# Patient Record
Sex: Female | Born: 1935
Health system: Southern US, Community
[De-identification: ages and names within clinical notes are randomized; demographics above are authoritative.]

## PROBLEM LIST (undated history)

## (undated) DIAGNOSIS — J449 Chronic obstructive pulmonary disease, unspecified: Secondary | ICD-10-CM

## (undated) DIAGNOSIS — F419 Anxiety disorder, unspecified: Secondary | ICD-10-CM

## (undated) DIAGNOSIS — M419 Scoliosis, unspecified: Secondary | ICD-10-CM

## (undated) DIAGNOSIS — K219 Gastro-esophageal reflux disease without esophagitis: Secondary | ICD-10-CM

## (undated) DIAGNOSIS — D649 Anemia, unspecified: Secondary | ICD-10-CM

## (undated) DIAGNOSIS — K7460 Unspecified cirrhosis of liver: Secondary | ICD-10-CM

## (undated) DIAGNOSIS — I499 Cardiac arrhythmia, unspecified: Secondary | ICD-10-CM

## (undated) DIAGNOSIS — I639 Cerebral infarction, unspecified: Secondary | ICD-10-CM

## (undated) DIAGNOSIS — Z9289 Personal history of other medical treatment: Secondary | ICD-10-CM

## (undated) DIAGNOSIS — E7800 Pure hypercholesterolemia, unspecified: Secondary | ICD-10-CM

## (undated) DIAGNOSIS — M5030 Other cervical disc degeneration, unspecified cervical region: Secondary | ICD-10-CM

## (undated) DIAGNOSIS — G8929 Other chronic pain: Secondary | ICD-10-CM

## (undated) DIAGNOSIS — M549 Dorsalgia, unspecified: Secondary | ICD-10-CM

## (undated) DIAGNOSIS — I10 Essential (primary) hypertension: Secondary | ICD-10-CM

## (undated) DIAGNOSIS — Z950 Presence of cardiac pacemaker: Secondary | ICD-10-CM

## (undated) DIAGNOSIS — R0602 Shortness of breath: Secondary | ICD-10-CM

## (undated) HISTORY — PX: CATARACT EXTRACTION W/ INTRAOCULAR LENS  IMPLANT, BILATERAL: SHX1307

## (undated) HISTORY — PX: JOINT REPLACEMENT: SHX530

## (undated) HISTORY — PX: TONSILLECTOMY: SUR1361

## (undated) HISTORY — DX: Presence of cardiac pacemaker: Z95.0

---
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
## 2001-09-29 ENCOUNTER — Encounter: Payer: Self-pay | Admitting: Emergency Medicine

## 2001-09-30 ENCOUNTER — Encounter: Payer: Self-pay | Admitting: Emergency Medicine

## 2001-09-30 ENCOUNTER — Inpatient Hospital Stay (HOSPITAL_COMMUNITY): Admission: EM | Admit: 2001-09-30 | Discharge: 2001-10-08 | Payer: Self-pay | Admitting: Emergency Medicine

## 2001-10-02 ENCOUNTER — Encounter: Payer: Self-pay | Admitting: Internal Medicine

## 2005-01-09 ENCOUNTER — Ambulatory Visit: Payer: Self-pay | Admitting: Family Medicine

## 2005-01-09 ENCOUNTER — Inpatient Hospital Stay (HOSPITAL_COMMUNITY): Admission: EM | Admit: 2005-01-09 | Discharge: 2005-01-10 | Payer: Self-pay | Admitting: Emergency Medicine

## 2008-05-23 HISTORY — PX: REPLACEMENT TOTAL KNEE: SUR1224

## 2008-07-07 ENCOUNTER — Inpatient Hospital Stay (HOSPITAL_COMMUNITY): Admission: RE | Admit: 2008-07-07 | Discharge: 2008-07-11 | Payer: Self-pay | Admitting: Orthopedic Surgery

## 2008-08-11 ENCOUNTER — Encounter: Admission: RE | Admit: 2008-08-11 | Discharge: 2008-09-29 | Payer: Self-pay | Admitting: Orthopedic Surgery

## 2008-08-25 ENCOUNTER — Ambulatory Visit: Payer: Self-pay | Admitting: Family Medicine

## 2008-08-25 DIAGNOSIS — R110 Nausea: Secondary | ICD-10-CM

## 2008-08-25 DIAGNOSIS — K219 Gastro-esophageal reflux disease without esophagitis: Secondary | ICD-10-CM

## 2008-08-25 DIAGNOSIS — D509 Iron deficiency anemia, unspecified: Secondary | ICD-10-CM | POA: Insufficient documentation

## 2008-08-25 DIAGNOSIS — I10 Essential (primary) hypertension: Secondary | ICD-10-CM

## 2008-08-25 DIAGNOSIS — E119 Type 2 diabetes mellitus without complications: Secondary | ICD-10-CM | POA: Insufficient documentation

## 2008-08-25 LAB — CONVERTED CEMR LAB
Eosinophils Absolute: 0 10*3/uL (ref 0.0–0.7)
Eosinophils Relative: 0 % (ref 0–5)
HCT: 41.9 % (ref 36.0–46.0)
Hemoglobin: 14.6 g/dL (ref 12.0–15.0)
Lymphocytes Relative: 18 % (ref 12–46)
Lymphs Abs: 1.9 10*3/uL (ref 0.7–4.0)
MCV: 93.3 fL (ref 78.0–100.0)
Monocytes Absolute: 0.6 10*3/uL (ref 0.1–1.0)
Monocytes Relative: 6 % (ref 3–12)
Platelets: 407 10*3/uL — ABNORMAL HIGH (ref 150–400)
RBC: 4.49 M/uL (ref 3.87–5.11)
RDW: 12.3 % (ref 11.5–15.5)
WBC: 10.3 10*3/uL (ref 4.0–10.5)

## 2009-02-26 ENCOUNTER — Ambulatory Visit: Payer: Self-pay | Admitting: Family Medicine

## 2009-02-26 DIAGNOSIS — R071 Chest pain on breathing: Secondary | ICD-10-CM

## 2009-03-17 LAB — HM MAMMOGRAPHY

## 2009-10-26 ENCOUNTER — Encounter: Payer: Self-pay | Admitting: Gastroenterology

## 2009-12-11 ENCOUNTER — Encounter: Payer: Self-pay | Admitting: Gastroenterology

## 2009-12-21 ENCOUNTER — Encounter (INDEPENDENT_AMBULATORY_CARE_PROVIDER_SITE_OTHER): Payer: Self-pay | Admitting: *Deleted

## 2009-12-22 ENCOUNTER — Encounter: Payer: Self-pay | Admitting: Gastroenterology

## 2009-12-22 ENCOUNTER — Encounter
Admission: RE | Admit: 2009-12-22 | Discharge: 2009-12-22 | Payer: Self-pay | Source: Home / Self Care | Admitting: Family Medicine

## 2010-02-03 ENCOUNTER — Ambulatory Visit: Payer: Self-pay | Admitting: Gastroenterology

## 2010-02-04 ENCOUNTER — Encounter: Payer: Self-pay | Admitting: Gastroenterology

## 2010-02-04 LAB — CONVERTED CEMR LAB
Basophils Absolute: 0 10*3/uL (ref 0.0–0.1)
Bilirubin, Direct: 0.1 mg/dL (ref 0.0–0.3)
CO2: 33 meq/L — ABNORMAL HIGH (ref 19–32)
Calcium: 9.9 mg/dL (ref 8.4–10.5)
Eosinophils Relative: 2.1 % (ref 0.0–5.0)
GFR calc non Af Amer: 74.32 mL/min (ref >60)
Glucose, Bld: 98 mg/dL (ref 70–99)
HCT: 40.9 % (ref 36.0–46.0)
Hemoglobin: 14.3 g/dL (ref 12.0–15.0)
Monocytes Absolute: 0.9 10*3/uL (ref 0.1–1.0)
Neutro Abs: 6.1 10*3/uL (ref 1.4–7.7)
Potassium: 4.7 meq/L (ref 3.5–5.1)
RBC: 4.57 M/uL (ref 3.87–5.11)
RDW: 12 % (ref 11.5–14.6)
Total Bilirubin: 0.5 mg/dL (ref 0.3–1.2)
Total Protein: 7 g/dL (ref 6.0–8.3)

## 2010-03-19 ENCOUNTER — Ambulatory Visit: Payer: Self-pay | Admitting: Gastroenterology

## 2010-03-19 DIAGNOSIS — R740 Nonspecific elevation of levels of transaminase and lactic acid dehydrogenase [LDH]: Secondary | ICD-10-CM

## 2010-03-22 LAB — CONVERTED CEMR LAB
AST: 35 units/L (ref 0–37)
Bilirubin, Direct: 0.1 mg/dL (ref 0.0–0.3)
Total Bilirubin: 0.9 mg/dL (ref 0.3–1.2)

## 2010-04-06 LAB — CONVERTED CEMR LAB: ANA Titer 1: 1:320 {titer} — ABNORMAL HIGH

## 2010-04-13 ENCOUNTER — Encounter: Payer: Self-pay | Admitting: Gastroenterology

## 2010-04-21 ENCOUNTER — Ambulatory Visit: Payer: Self-pay | Admitting: Gastroenterology

## 2010-04-21 LAB — CONVERTED CEMR LAB
AST: 121 units/L — ABNORMAL HIGH (ref 0–37)
Albumin: 3.3 g/dL — ABNORMAL LOW (ref 3.5–5.2)
Alkaline Phosphatase: 141 units/L — ABNORMAL HIGH (ref 39–117)
BUN: 14 mg/dL (ref 6–23)
Chloride: 91 meq/L — ABNORMAL LOW (ref 96–112)
Creatinine, Ser: 0.8 mg/dL (ref 0.4–1.2)
Eosinophils Absolute: 0.1 10*3/uL (ref 0.0–0.7)
Eosinophils Relative: 1 % (ref 0.0–5.0)
GFR calc non Af Amer: 72.19 mL/min (ref >60)
HCT: 40.9 % (ref 36.0–46.0)
Hemoglobin: 13.9 g/dL (ref 12.0–15.0)
IgA: 407 mg/dL — ABNORMAL HIGH (ref 68–378)
Iron: 91 ug/dL (ref 42–145)
Monocytes Relative: 5.3 % (ref 3.0–12.0)
Neutrophils Relative %: 78.4 % — ABNORMAL HIGH (ref 43.0–77.0)
RBC: 4.5 M/uL (ref 3.87–5.11)
RDW: 13.3 % (ref 11.5–14.6)
Total Protein: 6.6 g/dL (ref 6.0–8.3)
Transferrin: 270 mg/dL (ref 212.0–360.0)
WBC: 13.3 10*3/uL — ABNORMAL HIGH (ref 4.5–10.5)

## 2010-04-28 LAB — CONVERTED CEMR LAB
Ceruloplasmin: 41 mg/dL (ref 21–63)
Hep A Total Ab: POSITIVE — AB

## 2010-04-30 ENCOUNTER — Encounter: Payer: Self-pay | Admitting: Gastroenterology

## 2010-04-30 ENCOUNTER — Ambulatory Visit (HOSPITAL_COMMUNITY)
Admission: RE | Admit: 2010-04-30 | Discharge: 2010-04-30 | Payer: Self-pay | Source: Home / Self Care | Attending: Gastroenterology | Admitting: Gastroenterology

## 2010-05-25 ENCOUNTER — Telehealth: Payer: Self-pay | Admitting: Gastroenterology

## 2010-05-28 ENCOUNTER — Telehealth: Payer: Self-pay | Admitting: Gastroenterology

## 2010-06-01 ENCOUNTER — Ambulatory Visit
Admission: RE | Admit: 2010-06-01 | Discharge: 2010-06-01 | Payer: Self-pay | Source: Home / Self Care | Attending: Gastroenterology | Admitting: Gastroenterology

## 2010-06-01 ENCOUNTER — Other Ambulatory Visit: Payer: Self-pay | Admitting: Gastroenterology

## 2010-06-01 DIAGNOSIS — K745 Biliary cirrhosis, unspecified: Secondary | ICD-10-CM | POA: Insufficient documentation

## 2010-06-01 LAB — CBC WITH DIFFERENTIAL/PLATELET
Basophils Absolute: 0 10*3/uL (ref 0.0–0.1)
Basophils Relative: 0.1 % (ref 0.0–3.0)
Eosinophils Absolute: 0.1 10*3/uL (ref 0.0–0.7)
Eosinophils Relative: 0.6 % (ref 0.0–5.0)
HCT: 40.2 % (ref 36.0–46.0)
Hemoglobin: 13.9 g/dL (ref 12.0–15.0)
Lymphocytes Relative: 16.1 % (ref 12.0–46.0)
Lymphs Abs: 2 10*3/uL (ref 0.7–4.0)
MCHC: 34.5 g/dL (ref 30.0–36.0)
MCV: 90.9 fl (ref 78.0–100.0)
Monocytes Absolute: 0.8 10*3/uL (ref 0.1–1.0)
Monocytes Relative: 6.7 % (ref 3.0–12.0)
Neutro Abs: 9.6 10*3/uL — ABNORMAL HIGH (ref 1.4–7.7)
Neutrophils Relative %: 76.5 % (ref 43.0–77.0)
Platelets: 340 10*3/uL (ref 150.0–400.0)
RBC: 4.42 Mil/uL (ref 3.87–5.11)
RDW: 13.1 % (ref 11.5–14.6)
WBC: 12.6 10*3/uL — ABNORMAL HIGH (ref 4.5–10.5)

## 2010-06-01 LAB — COMPREHENSIVE METABOLIC PANEL
ALT: 16 U/L (ref 0–35)
AST: 22 U/L (ref 0–37)
Albumin: 3.3 g/dL — ABNORMAL LOW (ref 3.5–5.2)
Alkaline Phosphatase: 165 U/L — ABNORMAL HIGH (ref 39–117)
BUN: 10 mg/dL (ref 6–23)
CO2: 31 mEq/L (ref 19–32)
Calcium: 9.3 mg/dL (ref 8.4–10.5)
Chloride: 91 mEq/L — ABNORMAL LOW (ref 96–112)
Creatinine, Ser: 0.7 mg/dL (ref 0.4–1.2)
GFR: 94.37 mL/min (ref >60.00)
Glucose, Bld: 90 mg/dL (ref 70–99)
Potassium: 3.5 mEq/L (ref 3.5–5.1)
Sodium: 130 mEq/L — ABNORMAL LOW (ref 135–145)
Total Bilirubin: 0.6 mg/dL (ref 0.3–1.2)
Total Protein: 6.8 g/dL (ref 6.0–8.3)

## 2010-06-01 LAB — VITAMIN B12: Vitamin B-12: 496 pg/mL (ref 211–911)

## 2010-06-24 NOTE — Letter (Signed)
Summary: New Patient letter  [facility]  [address] [place], [place] [address]   Phone: [phone]  Fax: [phone]       12/21/2007 MRN: [id number]  Diamond Miller [address] [place], [place]  [address]  Dear [name],  Welcome to the Gastroenterology Division at [facility].    You are scheduled to see Dr.  Christella Hartigan on 02-03-10 at 2:00p.m. on the 3rd floor at [facility], [address]  We ask that you try to arrive at our office 15 minutes prior to your appointment time to allow for check-in.  We would like you to complete the enclosed self-administered evaluation form prior to your visit and bring it with you on the day of your appointment.  We will review it with you.  Also, please bring a complete list of all your medications or, if you prefer, bring the medication bottles and we will list them.  Please bring your insurance card so that we may make a copy of it.  If your insurance requires a referral to see a specialist, please bring your referral form from your primary care physician.  Co-payments are due at the time of your visit and may be paid by cash, check or credit card.     Your office visit will consist of a consult with your physician (includes a physical exam), any laboratory testing he/she may order, scheduling of any necessary diagnostic testing (e.g. x-ray, ultrasound, CT-scan), and scheduling of a procedure (e.g. Endoscopy, Colonoscopy) if required.  Please allow enough time on your schedule to allow for any/all of these possibilities.    If you cannot keep your appointment, please call [phone] to cancel or reschedule prior to your appointment date.  This allows [place] the opportunity to schedule an appointment for another patient in need of care.  If you do not cancel or reschedule by 5 p.m. the business day prior to your appointment date, you will be charged a $50.00 late cancellation/no-show fee.    Thank you for  choosing [facility] for your medical needs.  We appreciate the opportunity to care for you.  Please visit [place] at our website  to learn more about our practice.                     Sincerely,                                                             The Gastroenterology Division

## 2010-06-24 NOTE — Letter (Signed)
Summary: [facility]  [facility]   Imported By: Lennie Odor 04/22/2010 17:08:33  _____________________________________________________________________  External Attachment:    Type:   Image     Comment:   External Document

## 2010-06-24 NOTE — Assessment & Plan Note (Signed)
Review of gastrointestinal problems: 1. Asymptomatic Elevated alk phos.  Labs September 2011 show otherwise liver tests completely normal, CBC normal, INR normal. Abdominal ultrasound, liver was normal. No for rhinitis. Alkaline phosphatase varies from 200-400 Dating back to 2010.  October, 2011: anti-smooth muscle antibody negative, ANA homogenous + 1:320 titer.   History of Present Illness Visit Type: Follow-up Visit Primary GI MD: Rob Bunting MD Primary Provider: Leo Grosser, MD  Requesting Provider: na Chief Complaint: Discuss lab History of Present Illness:     Diamond 77 year old Miller who is here today with her long-term friend. She has no pruritus. She has no specific liver symptoms of jaundice, she is never been told that she had hepatitis before. I tried to explain to her that her AAA was very elevated, suggesting some type of autoimmune process. The bulk of her liver tests are all normal.   GI Review of Systems      Denies abdominal pain, acid reflux, belching, bloating, chest pain, dysphagia with liquids, dysphagia with solids, heartburn, loss of appetite, nausea, vomiting, vomiting blood, weight loss, and  weight gain.        Denies anal fissure, black tarry stools, change in bowel habit, constipation, diarrhea, diverticulosis, fecal incontinence, heme positive stool, hemorrhoids, irritable bowel syndrome, jaundice, light color stool, liver problems, rectal bleeding, and  rectal pain.    Current Medications (verified): 1)  Metoprolol Succinate 50 Mg Xr24h-Tab (Metoprolol Succinate) .... One By Mouth Once Daily 2)  Nexium 40 Mg Cpdr (Esomeprazole Magnesium) .... One Tablet By Mouth Once Daily 3)  Centrum  Tabs (Multiple Vitamins-Minerals) .... One Tablet By Mouth Once Daily 4)  Oxycodone Hcl 5 Mg Tabs (Oxycodone Hcl) .... One Tablet By Mouth Three Times A Day As Needed 5)  Diazepam 5 Mg Tabs (Diazepam) .... One Tablet By Mouth Three Times A Day As Needed 6)   Amlodipine Besylate 5 Mg Tabs (Amlodipine Besylate) .... One Tablet By Mouth Once Daily 7)  Trazodone Hcl 150 Mg Tabs (Trazodone Hcl) .... One Tablet By Mouth At Bedtime 8)  Vitamin C 500 Mg  Tabs (Ascorbic Acid) .... One Tablet By Mouth Once Daily 9)  Magnesium Oxide 400 Mg Caps (Magnesium Oxide) .... One Tablet By Mouth Once Daily 10)  Fish Oil   Oil (Fish Oil) .... One Tablet By Mouth Once Daily (Out) 11)  Glucosamine-Msm 1500-500 Mg/43ml Liqd (Glucosamine Hcl-Msm) .... 2 By Mouth Once Daily 12)  Injection (Drug Unknown) .... Given in The Hip For Arthritis As Needed  Allergies (verified): No Known Drug Allergies  Vital Signs:  Patient profile:   77 year old female Height:      59 inches Weight:      182.8 pounds BMI:     37.05 Pulse rate:   70 / minute Pulse rhythm:   regular BP sitting:   150 / 74  (left arm) Cuff size:   regular  Vitals Entered By: Harlow Mares CMA Duncan Dull) (April 21, 2010 9:48 AM)  Physical Exam  Additional Exam:  Constitutional: generally well appearing Psychiatric: alert and oriented times 3 Abdomen: soft, non-tender, non-distended, normal bowel sounds    Impression & Recommendations:  Problem # 1:  elevated liver tests she has isolated alkaline phosphatase elevation. Is not clear to me that she truly does have underlying liver disease. Her ANA was very elevated, suggesting some type of autoimmune process. She will get a further battery of liver tests today, summarized below. If these are all negative then I would likely  send her to see a rheumatologist to discuss her very elevated ANA level.  Other Orders: TLB-CBC Platelet - w/Differential (85025-CBCD) TLB-CMP (Comprehensive Metabolic Pnl) (80053-COMP) TLB-IgA (Immunoglobulin A) (82784-IGA) T-Tissue Transglutamase Ab IgA (04540-98119) T-Ceruloplasmin (14782-95621) T-Alpha-1-Antitrypsin Tot (30865-78469) T-AMA (62952) TLB-IBC Pnl (Iron/FE;Transferrin) (83550-IBC) TLB-Ferritin  (82728-FER) TLB-Iron, (Fe) Total (83540-FE) T-Hepatitis C Antibody (84132-44010) T-Hepatitis B Surface Antibody (27253-66440) T-Hepatitis B Surface Antigen (34742-59563) T-Hepatitis A Antibody, IGM (87564-33295) T- * Misc. Laboratory test ([phone])  Patient Instructions: 1)  You will get lab test(s) done today:  Hepatitis A (IgM and IgG), Hepatitis B surface antigen, Hepatitis B surface antibody, Hepatitis C antibody, total iron, ferritin, TIBC, AMA,, alpha 1 antitrypsin, cerulloplasm, TTG, total IgA level, CBC, CMET. 2)  Pending these results, you may need a referral to a rheumatologist for elevated ANA (1 to 320 titer, homogeneous pattern). 3)  A copy of this information will be sent to Dr. Elease Hashimoto. 4)  The medication list was reviewed and reconciled.  All changed / newly prescribed medications were explained.  A complete medication list was provided to the patient / caregiver.

## 2010-06-24 NOTE — Assessment & Plan Note (Signed)
Review of gastrointestinal problems: 1.  Primary biliary cirrhosis.  Labs September 2011 show otherwise liver tests completely normal, CBC normal, INR normal. Abdominal ultrasound, liver was normal. No for rhinitis. Alkaline phosphatase varies from 200-400 Dating back to 2010.  October, 2011: anti-smooth muscle antibody negative, ANA homogenous + 1:320 titer.  December, 2011 liver biopsy highly suggestive of PBC, early-stage.  Trial of Urso forte resulted in dramatic diarrhea   History of Present Illness Visit Type: Follow-up Visit Primary GI MD: Rob Bunting MD Primary Provider: Leo Grosser, MD  Requesting Provider: na Chief Complaint: Liver Biopsy results, pt c/o diarrhea History of Present Illness:      pleasant 77 year old woman who is here with one of her friends today.  two days after starting urso and she had terrible diarrhrea after about 3 days.  She stopped urso about 5 days ago, started immodium one pill a day.  The diarrhea is clearly improving.   she has myriad symptoms, dizziness, weakness, back pains, funny feelings. All of these have been present even before I started seeing her for her liver test elevation.            Current Medications (verified): 1)  Metoprolol Succinate 50 Mg Xr24h-Tab (Metoprolol Succinate) .... One By Mouth Once Daily 2)  Nexium 40 Mg Cpdr (Esomeprazole Magnesium) .... One Tablet By Mouth Once Daily 3)  Centrum  Tabs (Multiple Vitamins-Minerals) .... One Tablet By Mouth Once Daily 4)  Oxycodone Hcl 5 Mg Tabs (Oxycodone Hcl) .... One Tablet By Mouth Three Times A Day As Needed 5)  Diazepam 5 Mg Tabs (Diazepam) .... One Tablet By Mouth Three Times A Day As Needed 6)  Amlodipine Besylate 5 Mg Tabs (Amlodipine Besylate) .... One Tablet By Mouth Once Daily 7)  Trazodone Hcl 150 Mg Tabs (Trazodone Hcl) .... One Tablet By Mouth At Bedtime 8)  Vitamin C 500 Mg  Tabs (Ascorbic Acid) .... One Tablet By Mouth Once Daily 9)  Magnesium Oxide 400 Mg  Caps (Magnesium Oxide) .... One Tablet By Mouth Once Daily 10)  Fish Oil   Oil (Fish Oil) .... One Tablet By Mouth Once Daily (Out) 11)  Glucosamine-Msm 1500-500 Mg/27ml Liqd (Glucosamine Hcl-Msm) .... 2 By Mouth Once Daily 12)  Steroid Injection (Drug Unknown) .... Given in The Hip For Arthritis As Needed  Allergies (verified): 1)  ! * Urso Forte  Vital Signs:  Patient profile:   77 year old female Height:      59 inches Weight:      178 pounds BMI:     36.08 BSA:     1.76 Pulse rate:   80 / minute Pulse rhythm:   regular BP sitting:   120 / 72  (left arm)  Vitals Entered By: Merri Ray CMA Duncan Dull) (June 01, 2010 2:46 PM)  Physical Exam  Additional Exam:  Constitutional: generally well appearing Psychiatric: alert and oriented times 3 Abdomen: soft, non-tender, non-distended, normal bowel sounds    Impression & Recommendations:  Problem # 1:  primary biliary cirrhosis treatment with urso forte resulted in dramatic diarrhea.  she stop the medicine and her symptoms have been definitely improving. She seems now back to her baseline myriad pan body complaints.  she will get a basic set of labs including a CBC, complete metabolic profile. She will return to see me in 4 weeks and sooner if needed. She does not appear to be dehydrated in the office today. I explained to her that the treatment for primary biliary  cirrhosis, in her case, a be worse than the disease itself.  Other Orders: TLB-CBC Platelet - w/Differential (85025-CBCD) TLB-CMP (Comprehensive Metabolic Pnl) (80053-COMP) TLB-B12, Serum-Total ONLY ([address]-U04)  Patient Instructions: 1)  You have PBC (primary biliary cirrhosis).  You do not have cirrhosis on any xrays or biopsies.   The urso medicine that was started for the PBC probably caused your diarrhea. 2)  She has been feeling dehyrdated.  3)  Stay hydrated.  Take 1-2 immodium as needed for the diarrhea if it returns. 4)  You will get lab test(s) done today  (cbc, cmet, B12 level). 5)  Return to see Dr. Christella Hartigan in 4 weeks. 6)  A copy of this information will be sent to Dr. Lorie Phenix. 7)  The medication list was reviewed and reconciled.  All changed / newly prescribed medications were explained.  A complete medication list was provided to the patient / caregiver.

## 2010-06-24 NOTE — Assessment & Plan Note (Signed)
  Review of gastrointestinal problems: 1. Asymptomatic Elevated alk phos.  Labs September 2011 show otherwise liver tests completely normal, CBC normal, INR normal. Abdominal ultrasound, liver was normal. No for rhinitis. Alkaline phosphatase varies from 200-400 Dating back to 2010.    History of Present Illness Visit Type: Follow-up Visit Primary GI MD: Rob Bunting MD Primary Onita Pfluger: Leo Grosser, MD  Requesting Keari Miu: Leo Grosser, MD  Chief Complaint: 6 week follow-up History of Present Illness:     she feels completely well from a liver perspective. She has no pruritus, no jaundice.           Current Medications (verified): 1)  Metoprolol Succinate 50 Mg Xr24h-Tab (Metoprolol Succinate) .... One By Mouth Once Daily 2)  Nexium 40 Mg Cpdr (Esomeprazole Magnesium) .... One Tablet By Mouth Once Daily 3)  Centrum  Tabs (Multiple Vitamins-Minerals) .... One Tablet By Mouth Once Daily 4)  Oxycodone Hcl 5 Mg Tabs (Oxycodone Hcl) .... One Tablet By Mouth Three Times A Day 5)  Diazepam 5 Mg Tabs (Diazepam) .... One Tablet By Mouth Three Times A Day 6)  Amlodipine Besylate 5 Mg Tabs (Amlodipine Besylate) .... One Tablet By Mouth Once Daily 7)  Trazodone Hcl 150 Mg Tabs (Trazodone Hcl) .... One Tablet By Mouth At Bedtime 8)  Vitamin C 500 Mg  Tabs (Ascorbic Acid) .... One Tablet By Mouth Once Daily 9)  Magnesium Oxide 400 Mg Caps (Magnesium Oxide) .... One Tablet By Mouth Once Daily 10)  Fish Oil   Oil (Fish Oil) .... One Tablet By Mouth Once Daily 11)  Glucosamine-Msm 1500-500 Mg/88ml Liqd (Glucosamine Hcl-Msm) .... 2 By Mouth Once Daily  Allergies (verified): No Known Drug Allergies  Vital Signs:  Patient profile:   77 year old female Height:      59 inches Weight:      175 pounds BMI:     35.47 Pulse rate:   72 / minute Pulse rhythm:   regular BP sitting:   156 / 86  (left arm)  Vitals Entered By: Milford Cage NCMA (March 19, 2010 3:38 PM)  Physical  Exam  Additional Exam:  Constitutional: generally well appearing Psychiatric: alert and oriented times 3 Abdomen: soft, non-tender, non-distended, normal bowel sounds    Impression & Recommendations:  Problem # 1:  elevated alkaline phosphatase, asymptomatic she has no signs of cirrhosis biochemically or by history or physical exam. The rest of her liver tests are all completely normal however her alkaline phosphatase varies From 200 up to 400 or so.  perhaps she has underlying primary biliary cirrhosis. She will get repeat set of liver tests today as well as an ANA and ANA level. I doubt that this will ever be a problem for her clinically however I will probably want to observe her liver tests periodically.  Other Orders: TLB-Hepatic/Liver Function Pnl (80076-HEPATIC) T-AMA ([address]) T-ANA (60454-09811)  Patient Instructions: 1)  Aspirin and tylenol are safe for your liver, never take more than what directions state. 2)  You will get lab test(s) done today (LFTs, AMA, ANA). 3)  We will likely need to follow liver number every few months. 4)  The medication list was reviewed and reconciled.  All changed / newly prescribed medications were explained.  A complete medication list was provided to the patient / caregiver.

## 2010-06-24 NOTE — Progress Notes (Signed)
Summary: Triage  Phone Note Call from Patient Call back at Home Phone [phone]   Caller: Patient Call For: Dr. Christella Hartigan Reason for Call: Talk to Nurse Summary of Call: Pt wants to speak with nurse because she is very weak and think that the imodium is what is causing her to be weak Initial call taken by: [place] Johnson,  May 28, 2010 1:11 PM  Follow-up for Phone Call        pt complains of weakness and bloating she no longer has diarrhea but feels very weak and wants to stop the URSO.  She has appt next Tues for a f/u.  Can she stop until the appt? Follow-up by: Chales Abrahams CMA Duncan Dull),  May 28, 2010 1:24 PM  Additional Follow-up for Phone Call Additional follow up Details #1::        yes, definitely stop the urso for now.  Additional Follow-up by: Rachael Fee MD,  May 28, 2010 1:25 PM    Additional Follow-up for Phone Call Additional follow up Details #2::    pt aware she will keep appt on TUE Follow-up by: Chales Abrahams CMA (AAMA),  May 28, 2010 1:28 PM

## 2010-06-24 NOTE — Progress Notes (Signed)
Summary: Diarrhea  Phone Note Call from Patient Call back at [facility] Phone [phone]   Call For: Dr Christella Hartigan Summary of Call: Can not hold her bowels. Severe diarrhea side effect from medicine that she got on 05-07-10. Can she get it changed or get somethiong ordered for the diarrhea? Initial call taken by: Leanor Kail [facility],  May 25, 2010 9:13 AM  Follow-up for Phone Call        pt has had severe diarrhea since starting the [employer].  She has fecal incontinence as well.  She has 8-10 loose stools daily.  She is getting weak and dehydrated.  Can she have something for the diarrhea or change her meds? Follow-up by: Chales Abrahams CMA Duncan Dull),  May 25, 2010 9:21 AM  Additional Follow-up for Phone Call Additional follow up Details #1::        this is most common side effect.  she needs to start taking 1 immodium twice daily, every day and call in 1 week to report on symptoms Additional Follow-up by: Rachael Fee MD,  May 25, 2010 10:01 AM    Additional Follow-up for Phone Call Additional follow up Details #2::    pt aware has a f/u appt with Dr Christella Hartigan on 1 week Follow-up by: Chales Abrahams CMA (AAMA),  May 25, 2010 1:28 PM

## 2010-06-24 NOTE — Assessment & Plan Note (Signed)
History of Present Illness Visit Type: consult  Primary GI MD: Rob Bunting MD Primary Provider: Leo Grosser, MD  Requesting Provider: Leo Grosser, MD  Chief Complaint: elevated lfts  History of Present Illness:     very pleasant 77 year old woman who was found to have elevated lfts, alk phos was 228, other liver tests were all normal (august 17th, 2011).  Those are the only results we have currently. Apparently she had an abdominal ultrasound as well. Those results are not available either.  Used to take goody powders.  she was taking 3-4 Vicodin a day for some hip, leg pain but not other Tylenol. She has never been told that she had hepatitis before. She has never been known to be jaundiced. She does not have any biliary type symptoms.               Current Medications (verified): 1)  Metoprolol Succinate 50 Mg Xr24h-Tab (Metoprolol Succinate) .... One By Mouth Once Daily 2)  Nexium 40 Mg Cpdr (Esomeprazole Magnesium) .... One Tablet By Mouth Once Daily 3)  Centrum  Tabs (Multiple Vitamins-Minerals) .... One Tablet By Mouth Once Daily 4)  Oxycodone Hcl 5 Mg Tabs (Oxycodone Hcl) .... One Tablet By Mouth Three Times A Day 5)  Diazepam 5 Mg Tabs (Diazepam) .... One Tablet By Mouth Three Times A Day 6)  Amlodipine Besylate 5 Mg Tabs (Amlodipine Besylate) .... One Tablet By Mouth Once Daily 7)  Trazodone Hcl 150 Mg Tabs (Trazodone Hcl) .... One Tablet By Mouth At Bedtime 8)  Vitamin C 500 Mg  Tabs (Ascorbic Acid) .... One Tablet By Mouth Once Daily 9)  Magnesium Oxide 400 Mg Caps (Magnesium Oxide) .... One Tablet By Mouth Once Daily 10)  Fish Oil   Oil (Fish Oil) .... One Tablet By Mouth Once Daily  Allergies (verified): No Known Drug Allergies  Past History:  Past Medical History: Diabetes mellitus, type I Hypertension Acid Reflux morbid obesity Depression  Past Surgical History: Tonsillectomy Caesarean section Cosmetic nose surgery Ankle Surgery -  Broken Broken Jaw Hand/Eye Surgery Knee Replacement    Family History: Family History Hypertension Dementia no liver disease in family  Social History: Never Smoked Alcohol use-no Drug use-no Regular exercise-no she is single, she has one child  Review of Systems       Pertinent positive and negative review of systems were noted in the above HPI and GI specific review of systems.  All other review of systems was otherwise negative.   Vital Signs:  Patient profile:   76 year old female Height:      59 inches Weight:      182 pounds BMI:     36.89 BSA:     1.77 Pulse rate:   88 / minute Pulse rhythm:   regular BP sitting:   142 / 86  (right arm) Cuff size:   regular  Vitals Entered By: Ok Anis CMA (February 03, 2010 2:03 PM)  Physical Exam  Additional Exam:  Constitutional: Moves quite slowly, from hip, leg pains. Obese Psychiatric: alert and oriented times 3 Eyes: extraocular movements intact Mouth: oropharynx moist, no lesions Neck: supple, no lymphadenopathy Cardiovascular: heart regular rate and rythm Lungs: CTA bilaterally Abdomen: soft, non-tender, non-distended, no obvious ascites, no peritoneal signs, normal bowel sounds Extremities: no lower extremity edema bilaterally Skin: no lesions on visible extremities    Impression & Recommendations:  Problem # 1:  elevated liver tests her alkaline phosphatase was slightly elevated at 228,  the upper limit of normal is 165. This is the only lab result I have available currently. We will get recent ultrasound results sent over. We'll also get other liver test results from her primary care physician sent over. I've also get repeat set of liver testing today including basic LFTs, basic metabolic profile, CBC, INR. She is not all clinically appear to have cirrhosis.she will return to see me in 4-5 weeks to go over all these results.  Other Orders: TLB-CBC Platelet - w/Differential (85025-CBCD) TLB-BMP (Basic  Metabolic Panel-BMET) (80048-METABOL) TLB-Hepatic/Liver Function Pnl (80076-HEPATIC) TLB-PT (Protime) (85610-PTP)  Patient Instructions: 1)  You will get lab test(s) done today (cbc, LFTS, bmet, inr). 2)  Will get records from [facility] ultrasound. 3)  Will get all recent LFTs from PCP. 4)  Return to see Dr. Christella Hartigan in 4-5 weeks. 5)  The medication list was reviewed and reconciled.  All changed / newly prescribed medications were explained.  A complete medication list was provided to the patient / caregiver.

## 2010-07-09 ENCOUNTER — Ambulatory Visit: Payer: Self-pay | Admitting: Gastroenterology

## 2010-07-09 ENCOUNTER — Ambulatory Visit (INDEPENDENT_AMBULATORY_CARE_PROVIDER_SITE_OTHER): Payer: Medicare Other | Admitting: Gastroenterology

## 2010-07-09 ENCOUNTER — Encounter: Payer: Self-pay | Admitting: Gastroenterology

## 2010-07-14 NOTE — Assessment & Plan Note (Signed)
Review of gastrointestinal problems: 1.  Primary biliary cirrhosis.  Labs September 2011 show otherwise liver tests completely normal, CBC normal, INR normal. Abdominal ultrasound, liver was normal. No for rhinitis. Alkaline phosphatase varies from 200-400 Dating back to 2010.  October, 2011: anti-smooth muscle antibody negative, ANA homogenous + 1:320 titer.  December, 2011 liver biopsy highly suggestive of PBC, early-stage.  Trial of Urso forte resulted in dramatic diarrhea.   February, 2012:  diarrhea completely resolved after stopping the urso.   History of Present Illness Visit Type: Follow-up Visit Primary GI MD: Rob Bunting MD Primary Provider: Leo Grosser, MD  Requesting Provider: na Chief Complaint: diarrhea History of Present Illness:          77 year old woman who is again here with one of her good friends. I last saw her about one month ago.  she is still weak, has been weak since the knee surgery.  complain of neck, back pains.    The diarrhea is gone, has not needed any immodium in weeks.           Current Medications (verified): 1)  Metoprolol Succinate 50 Mg Xr24h-Tab (Metoprolol Succinate) .... One By Mouth Once Daily 2)  Nexium 40 Mg Cpdr (Esomeprazole Magnesium) .... One Tablet By Mouth Once Daily 3)  Centrum  Tabs (Multiple Vitamins-Minerals) .... One Tablet By Mouth Once Daily 4)  Oxycodone Hcl 5 Mg Tabs (Oxycodone Hcl) .... One Tablet By Mouth Three Times A Day As Needed 5)  Diazepam 5 Mg Tabs (Diazepam) .... One Tablet By Mouth Three Times A Day As Needed 6)  Amlodipine Besylate 5 Mg Tabs (Amlodipine Besylate) .... One Tablet By Mouth Once Daily 7)  Trazodone Hcl 150 Mg Tabs (Trazodone Hcl) .... One Tablet By Mouth At Bedtime 8)  Vitamin C 500 Mg  Tabs (Ascorbic Acid) .... One Tablet By Mouth Once Daily 9)  Magnesium Oxide 400 Mg Caps (Magnesium Oxide) .... One Tablet By Mouth Once Daily 10)  Fish Oil   Oil (Fish Oil) .... One Tablet By Mouth Once Daily  (Out) 11)  Glucosamine-Msm 1500-500 Mg/15ml Liqd (Glucosamine Hcl-Msm) .... 2 By Mouth Once Daily 12)  Steroid Injection (Drug Unknown) .... Given in The Hip For Arthritis As Needed 13)  Claritin 10 Mg Tabs (Loratadine) .... As Needed  Allergies (verified): 1)  ! * Urso Forte  Vital Signs:  Patient profile:   77 year old female Height:      59 inches Weight:      179.38 pounds BMI:     36.36 Pulse rate:   68 / minute Pulse rhythm:   regular BP sitting:   114 / 70  (left arm) Cuff size:   regular  Vitals Entered By: June McMurray CMA Duncan Dull) (July 09, 2010 2:19 PM)  Physical Exam  Additional Exam:  Constitutional: generally well appearing Psychiatric: alert and oriented times 3 Abdomen: soft, non-tender, non-distended, normal bowel sounds    Impression & Recommendations:  Problem # 1:   Primary biliary cirrhosis  I explained to her again the nature of her disease and the fact that she probably does not have functional damage to her liver. I do not think she has cirrhosis. the treatment, Urso,  for her is worse than the disease process itself and I recommended never retrying treatment. it took a while for her to digest this. I think she should have liver test checked every 6 months or so through her primary care physician but no other dedicated liver followup  needs to be performed  unless significant changes in her clinical status or liver tests.  Patient Instructions: 1)  You have primary biliary cirrhosis.  You can live with this for many, many years and will probably not have problems from it. 2)  The only treatment for PBC is urso, but you had terrible diarrhea and it should not be restarted, ever.  The treatment is likely worse than the disease. 3)  Follow up as needed with Dr. Christella Hartigan. 4)  A copy of this information will be sent to Dr. Elease Hashimoto. 5)  The medication list was reviewed and reconciled.  All changed / newly prescribed medications were explained.  A complete  medication list was provided to the patient / caregiver.

## 2010-08-02 LAB — CBC
HCT: 41.9 % (ref 36.0–46.0)
Platelets: 289 10*3/uL (ref 150–400)
WBC: 13.3 10*3/uL — ABNORMAL HIGH (ref 4.0–10.5)

## 2010-08-02 LAB — PROTIME-INR
INR: 0.94 (ref 0.00–1.49)
Prothrombin Time: 12.8 seconds (ref 11.6–15.2)

## 2010-09-07 ENCOUNTER — Inpatient Hospital Stay (INDEPENDENT_AMBULATORY_CARE_PROVIDER_SITE_OTHER)
Admission: RE | Admit: 2010-09-07 | Discharge: 2010-09-07 | Disposition: A | Discharge status: Home / Self Care | Payer: Medicare Other | Source: Ambulatory Visit | Attending: Emergency Medicine | Admitting: Emergency Medicine

## 2010-09-07 ENCOUNTER — Encounter: Payer: Self-pay | Admitting: Emergency Medicine

## 2010-09-07 DIAGNOSIS — R609 Edema, unspecified: Secondary | ICD-10-CM | POA: Insufficient documentation

## 2010-09-07 LAB — CBC
HCT: 25.9 % — ABNORMAL LOW (ref 36.0–46.0)
Hemoglobin: 10.2 g/dL — ABNORMAL LOW (ref 12.0–15.0)
Hemoglobin: 8.9 g/dL — ABNORMAL LOW (ref 12.0–15.0)
MCHC: 34.4 g/dL (ref 30.0–36.0)
MCHC: 34.3 g/dL (ref 30.0–36.0)
MCV: 92.5 fL (ref 78.0–100.0)
Platelets: 324 10*3/uL (ref 150–400)
RBC: 4.28 MIL/uL (ref 3.87–5.11)
RBC: 2.81 MIL/uL — ABNORMAL LOW (ref 3.87–5.11)
RBC: 3.23 MIL/uL — ABNORMAL LOW (ref 3.87–5.11)
RDW: 12.2 % (ref 11.5–15.5)
RDW: 12.1 % (ref 11.5–15.5)
WBC: 12.4 10*3/uL — ABNORMAL HIGH (ref 4.0–10.5)

## 2010-09-07 LAB — BASIC METABOLIC PANEL
BUN: 11 mg/dL (ref 6–23)
BUN: 13 mg/dL (ref 6–23)
BUN: 14 mg/dL (ref 6–23)
BUN: 17 mg/dL (ref 6–23)
BUN: 7 mg/dL (ref 6–23)
CO2: 32 mEq/L (ref 19–32)
CO2: 27 mEq/L (ref 19–32)
CO2: 28 mEq/L (ref 19–32)
CO2: 32 mEq/L (ref 19–32)
Calcium: 9.9 mg/dL (ref 8.4–10.5)
Calcium: 8 mg/dL — ABNORMAL LOW (ref 8.4–10.5)
Calcium: 9 mg/dL (ref 8.4–10.5)
Chloride: 93 mEq/L — ABNORMAL LOW (ref 96–112)
Chloride: 87 mEq/L — ABNORMAL LOW (ref 96–112)
Chloride: 90 mEq/L — ABNORMAL LOW (ref 96–112)
Chloride: 91 mEq/L — ABNORMAL LOW (ref 96–112)
Chloride: 94 mEq/L — ABNORMAL LOW (ref 96–112)
Creatinine, Ser: 0.75 mg/dL (ref 0.4–1.2)
Creatinine, Ser: 0.71 mg/dL (ref 0.4–1.2)
Creatinine, Ser: 0.77 mg/dL (ref 0.4–1.2)
Creatinine, Ser: 0.94 mg/dL (ref 0.4–1.2)
GFR calc Af Amer: >60 mL/min (ref >60)
GFR calc Af Amer: >60 mL/min (ref >60)
GFR calc Af Amer: >60 mL/min (ref >60)
GFR calc Af Amer: >60 mL/min (ref >60)
GFR calc non Af Amer: >60 mL/min (ref >60)
GFR calc non Af Amer: >60 mL/min (ref >60)
Glucose, Bld: 112 mg/dL — ABNORMAL HIGH (ref 70–99)
Glucose, Bld: 127 mg/dL — ABNORMAL HIGH (ref 70–99)
Glucose, Bld: 94 mg/dL (ref 70–99)
Potassium: 4.1 mEq/L (ref 3.5–5.1)
Potassium: 4.2 mEq/L (ref 3.5–5.1)
Potassium: 4.4 mEq/L (ref 3.5–5.1)
Potassium: 4.5 mEq/L (ref 3.5–5.1)
Potassium: 4.7 mEq/L (ref 3.5–5.1)
Sodium: 121 mEq/L — ABNORMAL LOW (ref 135–145)

## 2010-09-07 LAB — URINALYSIS, ROUTINE W REFLEX MICROSCOPIC
Bilirubin Urine: NEGATIVE
Glucose, UA: NEGATIVE mg/dL
Hgb urine dipstick: NEGATIVE
Ketones, ur: NEGATIVE mg/dL
Specific Gravity, Urine: 1.006 (ref 1.005–1.030)
pH: 7 (ref 5.0–8.0)

## 2010-09-07 LAB — ABO/RH: ABO/RH(D): O POS

## 2010-09-07 LAB — DIFFERENTIAL
Basophils Relative: 0 % (ref 0–1)
Eosinophils Relative: 1 % (ref 0–5)
Lymphocytes Relative: 27 % (ref 12–46)
Monocytes Absolute: 0.8 10*3/uL (ref 0.1–1.0)
Monocytes Relative: 9 % (ref 3–12)
Neutro Abs: 5.4 10*3/uL (ref 1.7–7.7)

## 2010-09-07 LAB — TYPE AND SCREEN

## 2010-09-07 LAB — PROTIME-INR: Prothrombin Time: 13.6 seconds (ref 11.6–15.2)

## 2010-09-08 ENCOUNTER — Telehealth (INDEPENDENT_AMBULATORY_CARE_PROVIDER_SITE_OTHER): Payer: Self-pay | Admitting: Emergency Medicine

## 2010-09-08 ENCOUNTER — Telehealth (INDEPENDENT_AMBULATORY_CARE_PROVIDER_SITE_OTHER): Payer: Self-pay | Admitting: *Deleted

## 2010-10-05 NOTE — H&P (Signed)
NAME:  Diamond Miller, Diamond Miller            ACCOUNT NO.:  [id number]   MEDICAL RECORD NO.:  [id number]          PATIENT TYPE:  INP   LOCATION:  NA                           FACILITY:  [facility]   PHYSICIAN:  Madlyn Frankel. Charlann Boxer, M.D.  DATE OF BIRTH:  1932/10/13   DATE OF ADMISSION:  07/07/2008  DATE OF DISCHARGE:                              HISTORY & PHYSICAL   PROCEDURE:  Will be a right total knee replacement.   CHIEF COMPLAINT:  Right knee pain.   HISTORY OF PRESENT ILLNESS:  A 77 year old female with a history of  right knee pain secondary to osteoarthritis.  It has been refractory to  all conservative treatment.  She has a history also of right ankle  surgery in the past as well.   PAST MEDICAL HISTORY:  1. Osteoarthritis.  2. Hypertension.  3. Reflux disease.   PAST SURGICAL HISTORY:  1. Eye surgery in 2009 and 2006.  2. Left knee surgery in 1994 for screw placement.  3. Hand surgery in 1986.  4. Broken jaw and surgery in 1983.  5. Right ankle surgery in 1982.  6. Tonsillectomy.   FAMILY HISTORY:  Hypertension, kidney failure, dementia, heart disease.   SOCIAL HISTORY:  Nonsmoker.  Will be going home with a friend afterwards  to help with rehab.  Says she will be home health care with physical  therapy postop.   DRUG ALLERGIES:  PENICILLIN.   MEDICATIONS:  1. Mobic.  2. Nexium.  3. Lisinopril.  4. Hydrochlorothiazide.  5. Diazepam.  6. Hydrocodone.  7. Glucosamine.   Dosage and frequency to be verified with patient at time of admission.   She will also be taking Celebrex 200 mg 1 p.o. b.i.d. x2 weeks after  surgery.   REVIEW OF SYSTEMS:  GENERAL:  She is anxious.  HEENT:  She wears  dentures.  CARDIOVASCULAR:  Last EKG was done in January of 2010.  GENITOURINARY:  She has increased urination at night.  MUSCULOSKELETAL:  She has joint pain, joint swelling, and back pain.  Otherwise, see HPI.   PHYSICAL EXAMINATION:  Pulse 72.  Respirations 16.  Blood pressure   150/84.  GENERAL:  Awake, alert, and oriented.  HEENT:  Normocephalic.  NECK:  Supple.  No carotid bruits.  CHEST:  Lungs clear to auscultation bilaterally.  BREASTS:  Deferred.  HEART:  S1 and S2 distinct.  ABDOMEN:  Soft, nontender, nondistended.  Bowel sounds present.  PELVIS:  Stable.  GENITOURINARY:  Deferred.  EXTREMITIES:  Right knee has diffuse anterior tenderness whereas knee in  brace.  SKIN:  No cellulitis.  NEUROLOGIC:  Intact distal sensibilities.   LABORATORY DATA:  Labs, EKG, chest x-ray, all pending presurgical  testing.   IMPRESSION:  Right knee osteoarthritis.   PLAN OF ACTION:  Right total knee replacement by Dr. Charlann Boxer at [facility] on July 07, 2008.  Risks and complications were discussed.   Postoperative medications were provided including aspirin for DVT  prophylaxis.     ______________________________  [name] [address], [place]      Madlyn Frankel. Charlann Boxer, M.D.  Electronically Signed  BLM/MEDQ  D:  07/02/2008  T:  07/02/2008  Job:  [id number]   cc:   Bufford Lope  Fax: [phone]

## 2010-10-05 NOTE — Op Note (Signed)
NAME:  SHADAY, RAYBORN NO.:  [id number]   MEDICAL RECORD NO.:  [id number]          PATIENT TYPE:  INP   LOCATION:  0009                         FACILITY:  [facility]   PHYSICIAN:  Madlyn Frankel. Charlann Boxer, M.D.  DATE OF BIRTH:  20-Aug-1932   DATE OF PROCEDURE:  07/07/2008  DATE OF DISCHARGE:                               OPERATIVE REPORT   PREOPERATIVE DIAGNOSIS:  Right knee osteoarthritis.   POSTOPERATIVE DIAGNOSIS:  Right knee osteoarthritis.   PROCEDURE:  Right total knee replacement utilizing DePuy components,  rotating platform posterior stabilized knee system, 2.5 femur, 3 tibia,  a 12.5 insert and a 35 patella button.   SURGEON:  Madlyn Frankel. Charlann Boxer, M.D.   ASSISTANT:  [name]. [address], P.A.-C   ANESTHESIA:  Spinal.   SPECIMENS:  None.   FINDINGS:  None.   COMPLICATIONS:  None.   DRAINS:  One medium Hemovac.   TOURNIQUET TIME:  35 minutes at 250 mmHg.   BLOOD LOSS:  Minimal.   INDICATIONS FOR THE PROCEDURE:  Ms. Flinchbaugh is a 77 year old female  who presented to the office with bilateral knee pain, right greater than  left.  She had some coexisting back issues, but the right knee seemed to  be the worst.  She had failed conservative measures and wished to  discuss surgical intervention.  Risks of infection, DVT, persistent  discomfort postop, the need for future revision surgery, including  manipulation were all discussed.  Consent was obtained for the benefit  of pain relief.  Postoperative course expectations were reviewed.   PROCEDURE IN DETAIL:  The patient was brought to the operative theater.  Once adequate anesthesia, preoperative antibiotics, Ancef administered,  the patient was positioned supine, Foley catheter placed, right thigh  tourniquet placed.  The right lower extremity was then prescrubbed and  prepped and draped in a sterile fashion.   Time-out was performed, identifying the patient, extremity and planned  procedure.   Leg was  exsanguinated, tourniquet elevated to 250 mmHg.  Midline  incision was made followed by a median arthrotomy.  Following initial  debridement, attention was directed to the patella.  Precut measurement  was 23 mm.  I resected down to 14 mm, used 35 patella button which fit  the cut surface best.  I placed a metal shim to protect the cut surface  from retractors and saw blades.   Attention was now directed to the femur.  Femoral canal was opened with  a drill and irrigated to prevent fat emboli.  An intramedullary rod was  passed and at 3 degrees of valgus I resected 10 mm of bone off the  distal femur.   Attention was now directed to the tibia.  The tibia was able to be  subluxated anteriorly with the initial debridement proximal and medial  field, and meniscal debridement and cruciate stump debridement.  Using  the extramedullary guide, I resected 10 mm of bone off the proximal and  lateral tibia.   I checked the angle of the extension block we found that the knee came  to full extension.  Pins were removed.   I  then checked the cut surface to make sure it was perpendicular in both  planes.  Once I was satisfied that it was, I used the cut surface of the  proximal tibia to set rotation of the femur.   The femur was sized to be a size 2.5.  I used the 2.5 rotation block,  pinned it in position based on the proximal cut surface of the tibia.  I  then exchanged it for the 4-in-1 cutting block.  The anterior, posterior  and chamfer cuts were then made without difficulty or notching.   Final box cut was made based off the lateral aspect of the distal femur.  Now the tibia was then resubluxated anteriorly.  The tibial tray was  pinned into position through the medial third of the tubercle, then  drilled and keel punched.  Trial reduction was now carried out.  The  knee came to full extension with the 10-mm insert, perhaps a little bit  of hyperextension.  The knee was stable throughout  the range of motion.  The patella tracked through the trochlea without application of  pressure.  At this point all trial components were removed.  The  synovial capsular junction was injected with 0.25% Marcaine with  epinephrine and 1 mL of Toradol followed.  The knee was irrigated with  normal saline solution.  The final components were opened, cement mixed.  Final components were cemented into position.  The knee was brought into  extension with a 12.5 insert in place with the knee in full extension.  Extruded cement was removed.  Once the cement had cured and excessive  cement was removed throughout the knee including the posterior aspect of  the knee, I chose to use a 12.5 insert to match the 2.5 size femur.  This was inserted.  The knee was reirrigated, tourniquet was let down at  35 minutes.  There was no hemostasis required.   Following irrigation, the knee was brought to flexion and the extension  mechanism was reapproximated using #1 Vicryl.  The remainder of the  wound was closed with 2-0 Vicryl and 4-0 Monocryl in the skin.  The skin  was cleaned, dried and dressed sterilely with Steri-Strips and a bulky  sterile wrap.  She was brought to the recovery room in stable condition,  tolerated the procedure well.      Madlyn Frankel Charlann Boxer, M.D.  Electronically Signed     MDO/MEDQ  D:  07/07/2008  T:  07/07/2008  Job:  [id number]

## 2010-10-05 NOTE — Discharge Summary (Signed)
NAME:  Diamond Miller, BENEDICT NO.:  [id number]   MEDICAL RECORD NO.:  [id number]          PATIENT TYPE:  INP   LOCATION:  1620                         FACILITY:  [facility]   PHYSICIAN:  Madlyn Frankel. Charlann Boxer, M.D.  DATE OF BIRTH:  [birth year]   DATE OF ADMISSION:  07/07/2008  DATE OF DISCHARGE:  07/11/2008                               DISCHARGE SUMMARY   ADMITTING DIAGNOSES:  1. Osteoarthritis.  2. Gastroesophageal reflux disease.  3. Hypertension.   DISCHARGE DIAGNOSES:  1. Osteoarthritis.  2. Hypertension.  3. Gastroesophageal reflux disease.  4. Acute blood loss anemia.   HISTORY OF PRESENT ILLNESS:  A 77 year old female with a history of  right-knee pain, secondary to osteoarthritis, refractory to all  conservative treatment.  Admitted for a right total knee replacement.   CONSULTATION:  None.   PROCEDURE:  Right total knee replacement by surgeon, Dr. Durene Romans.  Assistant [employer] PA-C.   LABORATORY DATA:  CBC, final reading, white blood cells 13, hemoglobin  8.9, hematocrit 25.9, platelets 238.  White cell differential all within  normal limits.  Coags normal.  Metabolic panel:  Sodium 129, potassium  3.90, BUN 7, creatinine 0.71, glucose 112.  Calcium 8.1.  Final reading  was 9 for her calcium.  Her UA was negative for nitrites, negative for  UTI.   She was transfused a couple units of blood.   HOSPITAL COURSE:  The patient underwent a right total knee replacement,  admitted to the orthopedic floor.  She did have acute blood loss anemia  while in the hospital and was transfused, otherwise remained stable.  Orthopedically, she remained stable throughout her course of stay.  Her  dressing was changed.  No significant drainage from wound.  She had  improving quad function throughout.  She was weightbearing as tolerated  with improving progress.  By February 19, she had met all criteria for  discharge home with home health care PT to increase range of motion  and  strength.   DISCHARGE DISPOSITION:  Discharged home in stable, improved condition  with home health care PT.   Discharge physical therapy weightbearing as tolerated with use of  rolling walker.  Goals for range of motion 0 to 120.   DISCHARGE DIET:  Heart-healthy.   DISCHARGE WOUND CARE:  Keep dry.   DISCHARGE MEDICATIONS:  1. Aspirin 325 mg p.o. b.i.d.  2. Robaxin 500 mg p.o. q. 6.  3. Iron 325 mg p.o. t.i.d.  4. Colace 100 mg p.o. b.i.d.  5. MiraLax 17 grams p.o. daily.  6. Norco 7.5/325 one to two p.o. q. 4-6 p.r.n. pain.   HOME MEDICATIONS:  1. Metoprolol ER 25 mg one p.o. q.a.m.  2. Lisinopril HCTZ 20/12.5 mg one p.o. q.a.m.  3. Diazepam 5 mg one p.o. t.i.d. p.r.n.  4. Trazodone 150 mg half tab q.p.m.  5. Nexium 40 mg one p.o. q.a.m.  6. Vitamin C 1000 mg one p.o. daily.  7. Multivitamin p.o. daily.  8. Celebrex 200 mg one p.o. b.i.d. take his for 2 weeks and then down      to one p.o. daily then  discontinue when prescription completed.   DISCHARGE FOLLOWUP:  Follow up with Dr. Charlann Boxer at phone number [phone] in  two weeks for wound check.     ______________________________  Diamond Miller. Diamond Miller, [place]      Madlyn Frankel. Charlann Boxer, M.D.  Electronically Signed    BLM/MEDQ  D:  07/30/2008  T:  07/30/2008  Job:  [id number]   cc:   Dr, Riki Rusk

## 2010-10-08 NOTE — Discharge Summary (Signed)
[facility]  Patient:    Diamond Miller, Diamond Miller Visit Number: [id number] MRN: [id number]          Service Type: MED Location: 3W [id number] [id number] Attending Physician:  Anastasio Auerbach Dictated by:   Anastasio Auerbach, M.D. Admit Date:  09/29/2001 Discharge Date: 10/08/2001   CC:         Dr. Leatha Gilding, [facility], [place] [phone])             Alan Mulder, M.D., [facility]  Adelene Amas. Williford, M.D.  Tera Mater. Evlyn Kanner, M.D.  Jefry H. Pollyann Kennedy, M.D.   Discharge Summary  DATE OF BIRTH:  28-Jul-1932  DISCHARGE DIAGNOSES:  1. Multidrug overdose, unintentional.     a. Aspirin and Tylenol found at elevated levels.     b. Associated mental status changes, resolved.     c. Transient renal insufficiency and transaminasemia, resolved.  2. Dehydration.  3. Prerenal azotemia.     a. Admission BUN 46, creatinine 2.2).     b. Discharge BUN 7, creatinine 1.0.  4. Hypokalemia secondary to dehydration and diuretic, resolved with     replacement.  5. Major depressive disorder, recurrent and improved.     a. Transient psychotic disorder not otherwise specified        1) Delirium resolved.        2) No evidence of delusions at this time.        3) No witnessed psychoses at the time of discharge.     b. Anxiety disorder, not otherwise specified.  6. Chronic benzodiazepines dependence on Valium prior to admission.  7. Left preseptal cellulitis with lacrimal sac involvement.     a. Mild trauma prior to admission with history of nasal surgery.     b. Onset Oct 03, 2001, in hospital.     c. CT scan (May 14) superficial cellulitis, no orbital involvement.  8. Hypertension.  9. Normocytic anemia with discharge hemoglobin 11.1, MCV 89. 10. Transient diarrhea, improved, Clostridium difficile toxin negative. 11. Allergy:  SULFA. 12. History of right ankle and left knee surgery. 13. Gastroesophageal reflux disease. 14. Abnormal thyroid function  tests.     a. Consistent with possible ______ thyroid.     b. Possibly interfered with by acute reasons for hospitalization.  DISCHARGE MEDICATIONS:  1. (New) Augmentin 875 mg p.o. at 8 a.m. and 8 p.m. x 7 days (total     13-day therapy).  2. (New) Lexapro 10 mg q.d.  3. Prilosec 20 mg q.d.  4. (New) Multivitamin daily.  5. (New) Combivent MDI 2 puffs t.i.d. (The patient had a chronic cough     while she was here.)  6. (New) Klonopin 0.25 mg q.a.m.  (Replaces Valium.)  7. (New) Ocuflox 1 drop in left eye q.i.d.  8. (New) Bacitracin ointment to scab below eye b.i.d.  9. (New) Artificial Tears as needed. 10. (New) Vicodin 1 q.4h. p.r.n. pain, dispense #20, no refill.     DO NOT TAKE VALIUM, MAXZIDE, OR ASPIRIN PRODUCTS.  CONDITION UPON DISCHARGE:  Stable.  DISPOSITION:  Home.  ACTIVITY:  As tolerated.  No driving until the eye doctor follows up with you.  DIET:  No salt.  WOUND CARE:  Continue putting ointment beneath the eye.  Call or return if you have problems.  FOLLOWUP:  1. Instead of mental health, you can call the [facility] at [phone]     to follow up on depression.  2. You are to see  the eye doctor, Dr. Alan Mulder at [facility] (Number [phone]) on Thursday, May 22, at 7:30 in the morning.  3. The patient is to contact Dr. Leatha Gilding office (Number [phone]) to     schedule followup in 7 to 10 days, sooner if possible.  At that visit,     follow up on new medications started as well as blood pressure and     plan to repeat thyroid function tests in four weeks.  CONSULTATIONS:  1. Tera Mater. Evlyn Kanner, M.D.  2. Beulah Gandy. Ashley Royalty, M.D.  3. Jefry H. Pollyann Kennedy, M.D.  4. Adelene Amas. Williford, M.D.  DIAGNOSTIC DATA:  Chest x-ray (Sep 29, 2001): No acute cardiopulmonary disease.  Head CT without contrast (Sep 30, 2001): No acute intracranial abnormality.  Two-view chest x-ray (Oct 02, 2001): Small bilateral effusions with bibasilar atelectasis, mild  diffuse accentuation of the interstitial markings, heart size normal.  Maxillofacial CT with contrast (Oct 03, 2001): Normal CT of the brain with contrast for the patients age.  She does have minimal changes of cellulitis in the subcutaneous tissue exterior to the left orbit with no retroconal or globe abnormality, very small fluid levels in the left frontal and right sphenoid sinus.  HOSPITAL COURSE:  #1 - ACCIDENTAL OVERDOSE:  Gilberta Lamarre is a 77 year old Caucasian female who presents with mental status changes.  She was behaving unusually in terms of demonstrating some paranoid behavior and possible visual hallucinations. Her toxin screen was positive for elevated levels of aspirin as well as mildly elevated levels of Tylenol.  She was treated with alkalinized IV fluids but did not require Mucomyst.  With the ingestion, she had some mild renal insufficiency with a BUN of 46 and a creatinine of 2.2.  Given the fact that these resolved to normal, it is expected that they were above her baseline. She also had some mild transaminitis which resolved.  As she recovered from her mental status changes, it was unclear as to exactly what happened.  There was no evidence to suggest that this was an intentional overdose. Dr. Jeanie Sewer, psychiatrist, saw her and thought that maybe there was a component of retrograde amnesia.  We had lengthly discussions with the family who thought that perhaps she was having some delusions or odd behavior prior to this.  During our time with her in the hospital and certainly toward the end of her hospital stay, she was alert and oriented, competent, and not expressing any delusional or paranoid behavior.  It was felt by Dr. Jeanie Sewer  that she did have major depression which he discussed with her, and she gave informed consent to begin Lexapro q.d.  He offered her followup at mental health.  The patient said she had a bad experience at mental health and  would not go there.  We offered an alternative at the [facility].  Case management helped me with this.  I have discussed this situation with the patients primary care doctor in [place], and she will be following up closely if the patient keeps her followup appointments.  There was no residual metabolic derangements related to the toxicity of either aspirin or Tylenol. She is felt not to be a danger to herself or others.  #2 - LEFT PRESEPTAL CELLULITIS WITH LACRIMAL SAC INVOLVEMENT:  George Alcantar was apparently hit in the eye as they were breaking down the door to get into her apartment.  On May 15, three days into her hospital stay, she developed a  significant redness and swelling.  It was apparent that she had cellulitis.  CT scan was done which demonstrated no ocular involvement.  her extraocular muscles and pupillary reaction remained intact during her entire stay.  Because this came on in the hospital, I did not place her on gentamicin and vancomycin.  I consulted both ophthalmology and otolaryngology.  They thoughtfully saw the patient and made recommendations.  She will complete a total of 13 days of antibiotics, Augmentin for 7 days as an outpatient. Dr. Ashley Royalty has agreed to see her back in the office.  At the time of discharge, the redness above the eyelid has resolved.  There is still puffiness over the lacrimal gland area but no induration or evidence of progressive infection.  As stated above, she will be treated with Celexa. Followup with her primary care physician as well as the [facility]  #3 - CHRONIC BENZODIAZEPINE USE:  We opted to put her on Klonopin this admission.  Valium tends to have longer acting metabolites and decreased clearance.  #4 - HYPOKALEMIA:  This was somewhat of a persistent problem this admission. Her blood pressure was relatively stable, and I have told her to stay off the Maxzide for now.  Dr. Leatha Gilding can follow this up as an  outpatient.  #5 - ABNORMAL THYROID FUNCTION TESTS:  TSH was slightly low at 0.041.  My concern when this returned was that she may have an element of thyroid toxicosis resulting in some mental status changes.  Interestingly, her free T3 and free T4 both came back normal to low.  As a result, I did ask endocrinologist, Dr. Adrian Prince, to see the patient.  He felt that this likely represented a euthyroid state.  There was also known effect from salicylates on bindings which would affect measurements.  At this point, the recommendation is to simply repeat the TSH in four to six weeks.  I concur with this and have forwarded this to the patients primary care physician.  #6 - MILD SINUSITIS:  This was seen on CT scan.  I only bring this up because the patient describes two to three-month history of a chronic cough, although she adamantly denies postnasal drip.  I am wondering if this is a possible cause.  She is already on antireflux medications at home.  I did not start her on Nasonex or Claritin this admission, but will have her follow up with Dr. Leatha Gilding and see what she thinks.  The Augmentin and, of course, Zosyn here in the hospital should cover sinusitis.  DISCHARGE AND OTHER PERTINENT LABORATORY DATA:  Magnesium 2.1, sodium 140, potassium 4.4, chloride 103, bicarb 30, BUN 7, creatinine 1.0, glucose 102. Hemoglobin 11.1, MCV 89, WBC 7200, platelet count 379.  Blood cultures negative x 2.  Free T4 0.89, repeat TSH 0.645, free T3 2.1 (normal 2.3 to 4.2).  Initial TSH low at 0.041.  Liver function normal on May 13 except for albumin of 2.4.  Admission aspirin level 55, Tylenol level 17.4.Dictated by: Anastasio Auerbach, M.D. Attending Physician:  Anastasio Auerbach DD:  10/10/01 TD:  10/10/01 Job: [id number]

## 2010-10-08 NOTE — H&P (Signed)
NAME:  Diamond Miller, MCCUMBERS NO.:  [id number]   MEDICAL RECORD NO.:  [id number]          PATIENT TYPE:  EMS   LOCATION:  MAJO                         FACILITY:  [facility]   PHYSICIAN:  Leighton Roach McDiarmid, M.D.DATE OF BIRTH:  1933/01/08   DATE OF ADMISSION:  01/08/2005  DATE OF DISCHARGE:                                HISTORY & PHYSICAL   CHIEF COMPLAINT:  Chest pain, fatigue.   HISTORY OF PRESENT ILLNESS:  Diamond Miller is a 77 year old white female  who came in with a chief complaint of substernal chest pain and fatigue. The  patient states that this all began around August 8th after she felt  overheated from taking her car to be worked on. The patient states that she  felt weak, nauseated, and began to have substernal chest pain that would  come and go, but most likely was constant in nature at all times. The  patient states she got overheated and had continued to be fatigued ever  since then with nausea and chest pain to the point where she had to come in  today with most worsening of the pain. The patient denies vomiting, however  states she has diarrhea x1, but not recently. The patient complains of a  cough with yellow sputum in the past couple of days, however now her cough  is nonproductive. The patient does not eat a lot due to financial strains  and eats approximately one meal a day which is brought in by her daughter  now for one week. The chest pain is rated as a 10/10 and is constantly  there. It is not increased in exercise or rest. The patient states that it  hurts to breathe, however she does not complain of shortness of breath. The  patient denies any diaphoresis or radiation of the chest pain. The pain is  reproducible with palpation of her substernal region. The pain does not  increase with lying down, however does increase with lying supine. The  patient states she has had a positive weight loss of approximately 5 pounds  since August 8th. There has  been no edema or swelling noted. The patient  takes [facility] Powder for pain relief, usually two per day. The patient states  that the pain has worsened today to the point where she has had to come in  along with her fatigue. The patient does not normally have a very active  lifestyle. She watches a lot of TV, does some house cleaning; however, over  the last two weeks has not been able to do any house cleaning or take care  of herself by making herself food. The patient is not confused and denies  confusion at this time. She is alert and oriented x3. Her niece is present  in the room with her. Otherwise, the patient is a poor historian.   REVIEW OF SYSTEMS:  Negative for history of coronary artery disease,  diabetes, or kidney problems. Otherwise, negative except for as above.   PAST MEDICAL HISTORY:  1.  She has a history of multidrug overdose with an admission to [facility]  in 08-Oct-2001 for 9 days. This was due to Tylenol overingestion.  2.  Prerenal azotemia with that admission.  3.  History of transaminemia on admission in 10-08-01.  4.  Major depressive disorder.  5.  Benzodiazepine dependence.  6.  Hypertension.  7.  Normocytic anemia.  8.  Gastroesophageal reflux disease.   PAST SURGICAL HISTORY:  1.  Right ankle surgery.  2.  Left knee surgery.  3.  Jaw surgery in 1994.  4.  Right hand surgery in the 1980s.   SOCIAL HISTORY:  The patient denies tobacco, alcohol, or drug usage.   FAMILY HISTORY:  Her mother is deceased, she died in 50 due to a bowel  obstruction. She had a known history of cardiac aneurysm and hypertension.  Her father is deceased in 10/09/1967 due to kidney failure and had problems with  hypertension. The patient has a sister who died due to multiple medical  problems and she has a brother who died in Oct 08, 2000 from multiple medical  problems.   MEDICATIONS ON ADMISSION:  1.  Nexium at home one daily.  2.  Maxzide one daily and sometimes the patient doubles up on her  Maxzide.      However, she states she has only done this once in the past two weeks.  3.  Diazepam 5 mg tablet p.o. b.i.d. as needed.   PHYSICAL EXAMINATION:  VITAL SIGNS: Temperature 98.1, respirations 18, pulse  83, blood pressure 151/73. The patient is saturating 98% on oxygen on room  air.  GENERAL: The patient is white female, in no acute distress with  normocephalic and atraumatic, pleasant, very talkative, somewhat poor  historian with her details of medications and past medical history.  HEENT: Pupils equal, round, and reactive to light bilaterally. Extraocular  muscles are intact. Moist mucous membranes. Midline tongue. Dentures are  upper and lower. There are no neck masses.  CARDIOVASCULAR: Regular rate and rhythm. No murmurs, rubs, or gallops.  LUNGS: Chest tightness bilaterally. Good and symmetrical movement.  CHEST: There is reproducible pain with deep palpation of her sternum at  10/10. However, the patient was not screaming off the bed. She just stated  that the pain was the same as what was before.  ABDOMEN: Soft, nontender, nondistended. Positive bowel sounds. No  hepatosplenomegaly.  EXTREMITIES: No edema. No clubbing, cyanosis. No tinting. There is 5/5  strength bilaterally in upper and lower extremities. There in +2 pulses  dorsalis pedis bilaterally.  NEUROLOGIC: The patient is alert and oriented x5. She had a 2/3 short-term  memory. She was able to spell [id number] forwards and backwards. She was not  confused. Cranial nerves II-XII are intact. The patient is able to speak in  complete sentences and respond appropriately to questions.   LABORATORY DATA:  Sodium was noted to be 116, potassium 3.9, chloride 82,  CO2 22, BUN 7, creatinine 1.1, glucose 115, calcium 9.4, total protein 7.1,  albumin 3.9, AST 92, ALT 85, total bilirubin of 0.5, alkaline phosphatase 156. It was noted that the patient had normal sodium and LFTs on her  discharge in 2001-10-08. Her BNP was 55.5.  Her D-dimer was essentially negative at  0.45.  Her first set of point of care markers in the ED revealed myoglobin  of 136, CK-MB less than 1, and troponin-I of less than 0.05 which are  negative.   Chest x-ray is negative with no acute changes. EKG revealed normal sinus  rhythm, no acute changes. There was no old EKG for  comparison.   In the ED the patient has received normal saline intravenous fluids and a GI  cocktail.   ASSESSMENT/PLAN:  The patient is a 77 year old white female with:  1.  Hyponatremia. This is most likely a euvolemic state. Differential      diagnosis including SIADH versus hydrochlorothiazide causing      hyponatremia versus hyperthyroidism versus malnutrition. We will obtain      serum and urine osmolalities, urine sodium, TSH, urinalysis, recheck the      BMP and CBC in the a.m. The patient is currently not symptomatic. We      will heparin lock an IV, and __________ to 1500 mL a day. We will await      pending laboratories to determine further management. The patient was      noted to have moist mucous membranes on exam and no tenting noted.  2.  Atypical chest pain. This is most likely secondary to gastroesophageal      reflux disease, however we cannot rule out cardiac causes. We will      obtain cardiac enzymes x3 with repeat EKG in the a.m. The first set of      cardiac enzymes point of care were negative and EKG in the ED was with      normal sinus rhythm and no acute changes. Chest x-ray with no acute      disease. The GI cocktail received in the ED had mild improvement of her      pain. However, she was reproducible with deep palpation of the sternum.      We will place the patient on telemetry overnight, a set of cardiac      enzymes to rule out, and per the repeat EKG we will discontinue the      telemetry.  3.  Fatigue and weakness. Patient with a history of poor nutritional status      and decreased energy over the past two weeks and worse being  today.      There is 5/5 muscle strength. We will give the patient a regular diet.      We will not start IV fluids at this time. We will monitor her. Most      likely this is secondary to her hypernatremic state.  4.  Elevated transaminases. The patient is status post discharge from      hospitalization in 2003. She had normal LFTs. We will recheck the      complete metabolic panel and a fasting lipid profile in the a.m. We will      consider an acute hepatitis panel in the a.m. if the repeated labs are      elevated or increased.  5.  Gastroesophageal reflux disease. We will place the patient on Protonix      40 mg p.o. daily. We will check her H. pylori antigen and start her on a      regular diet.  6.  Hypertension. We will hold her Maxzide currently due to increased chance      of hyponatremia with a hydrochlorothiazide component of this medication.     We will begin metoprolol 50 mg p.o. daily for blood pressure. Her pulse      was noted to be greater than 60 on starting of this medication.  7.  Deep venous thrombosis prophylaxis. We will place the patient with SCDs      and the patient is to ambulate.  8.  History of benzodiazepine dependence and  anxiety. We will continue her      home dose of diazepam with 5 mg p.o. b.i.d. p.r.n.  9.  Code status. The patient is to be labeled as a full code.      Barth Kirks, M.D.    ______________________________  Leighton Roach McDiarmid, M.D.    MB/MEDQ  D:  01/09/2005  T:  01/09/2005  Job:  [id number]

## 2010-10-08 NOTE — Discharge Summary (Signed)
NAME:  Diamond Miller, Diamond Miller NO.:  [id number]   MEDICAL RECORD NO.:  [id number]          PATIENT TYPE:  INP   LOCATION:  3737                         FACILITY:  [facility]   PHYSICIAN:  Henri Medal, MDDATE OF BIRTH:  25-Sep-1932   DATE OF ADMISSION:  01/08/2005  DATE OF DISCHARGE:  01/10/2005                                 DISCHARGE SUMMARY   DISCHARGE DIAGNOSES:  1.  Noncardiac chest pain.  2.  Hypertension.  3.  Hyponatremia.  4.  Viral gastroenteritis.  5.  Gastroesophageal reflux disease.  6.  Increased LFTs.   DISCHARGE MEDICATIONS:  1.  Metoprolol (25 mg) one tab p.o. b.i.d.  2.  Nexium (40 mg) one tab p.o. daily.  3.  Diazepam (5 mg) one tab p.o. b.i.d. p.r.n.   FOLLOWUP APPOINTMENT:  The patient will call for an appointment with Dr.  Leanna Battles at [facility] (PCP).   INSTRUCTIONS:  Continue to take metoprolol (25 mg) one tab p.o. b.i.d. for  hypertension.  Please, do not take Maxzide until next appointment with Dr.  Leanna Battles to avoid decreased sodium.   HOSPITAL COURSE:   BRIEF HISTORY PHYSICAL EXAMINATION AND LABORATORY DATA ON ADMISSION:  A 77-  year-old white female came to the [facility] Emergency Department  complaining of suprasternal chest pain and fatigue.  The patient stated this  complaint has been on and off since August 8.  The patient felt weak,  nauseated, and started to feel substernal chest pain that became constant  with an intensity rate of 10/10.  The patient was not increased in exercise  or rest.  The patient stated that it hurts to breathe.  However, the patient  did not complain of shortness of breath.  The patient denied diaphoresis,  variation of chest pain.  Chest pain was reproducible with palpation of  suprasternal region.  Chest pain increased with lying supine.  The patient  has lost five pounds since August 8.  The patient takes [facility] Powder for pain  relief twice daily.  Since chest pain and fatigue worsened  on day of  admission, the patient decided to come to [facility] Emergency Department.  The patient also stated that she has decreased p.o. intake for the last two  weeks.  The patient stated that for the last two weeks the patient has been  unable to do any house cleaning or take care of herself by making by making  herself full.   PHYSICAL EXAMINATION ON ADMISSION:  VITAL SIGNS:  Temperature 98.1,  respirations 18, pulse 83, blood pressure 161/73.  O2 saturation 98% on room  air.  GENERAL:  No acute distress.  Oriented x3.  CHEST:  Chest tightness bilaterally and symmetrical movement.  Reproducible  pain with deep palpation of her sternum at 10/10.   Otherwise, physical examination was unremarkable.   LABORATORY DONE ON ADMISSION:  Sodium 116, potassium 3.9, chloride 82, Co2  22, BUN 7, creatinine 1.1, glucose 116, calcium 9.4.  Total protein 7.1,  albumin 3.9, AST 92, ALT 85, total bilirubin 0.5, alkaline phosphatase 156,  BNP 55.5.  D-dimer negative (0.45).  POC cardiac markers:  Myoglobin 136, CK-  MB less than 1, troponin I less than 0.05.  Chest x-ray showed no acute  changes.  EKG normal sinus rhythm, no acute changes.   The patient was admitted to rule out myocardial infarction and hyponatremia.   PROBLEM LIST:  1.  Chest pain.  The patient was admitted to rule out myocardial infarction.      Cardiac enzymes were normal.  EKG was normal.  Myocardial infarction was      ruled out.  The patient has reproducible chest pain on palpation, and      was relieved by an injection ([place]) of lidocaine.  Therefore, more likely      secondary to osteochondritis.  2.  Hypertension.  Stable throughout the rest of hospitalization.  Maxzide      was held secondary to hyponatremia.  The patient was placed on      metoprolol 25 mg one tab p.o. b.i.d.  The patient will continue to take      metoprolol until next appointment with Dr. Leanna Battles (PCP at [facility]).  Primary  physician to check levels of sodium to resume      Maxzide.  3.  Hyponatremia.  Most likely secondary to decreased p.o. intake and      Maxzide.  The patient was initially fluid restricted.  Then, half normal      saline IV fluids.  Sodium increased from 116 to 130.  Euvolemic      hyponatremia most likely secondary to decreased p.o. intake and diuretic      use.  Continue to hold Maxzide until next appointment with Dr. Leanna Battles.      To be followed with family physician.  4.  Viral gastroenteritis.  On admission, the patient was complaining of      nausea and diarrhea.  This was most likely secondary to viral process.      By day of discharge nausea, vomiting resolved.  Diarrhea improved.  To      be followed by family physician.  5.  Gastroesophageal reflux disease.  Throughout the rest of hospitalization      stable.  Continue Nexium 40 mg one tab p.o. daily.  To be followed by      family physician.  6.  Increased LFTs.  By reviewing previous records of an admission in 2003,      we found that the patient had normal liver enzymes.  During this      admission, liver enzymes are mildly increased.  Hepatitis panel is      pending.  To be followed by Dr. Leanna Battles at [facility]      (PCP).  The patient is currently stable and asymptomatic.  7.  Fatigue/weakness.  Most likely secondary to hyponatremia.  TSH was      checked to rule out thyroid disease with normal results (0.551).  Lipid      panel shows cholesterol 148, triglyceride 106, HDL 42, LDL 85.  Consider      to rule out depression since the patient stated that she has a two-week      history of not being able to fix meals or take care of herself secondary      to fatigue/weakness.  Even though this is was most likely secondary to      hyponatremia, this issue to be ruled out and follow up with family      physician.  Henri Medal, MD    FIM/MEDQ  D:  01/10/2005  T:  01/10/2005  Job:  [id number]   cc:    Dr. Leanna Battles at [facility]

## 2011-02-07 ENCOUNTER — Encounter: Payer: Self-pay | Admitting: *Deleted

## 2011-02-07 ENCOUNTER — Emergency Department (HOSPITAL_BASED_OUTPATIENT_CLINIC_OR_DEPARTMENT_OTHER)
Admission: EM | Admit: 2011-02-07 | Discharge: 2011-02-07 | Disposition: A | Discharge status: Home / Self Care | Payer: Medicare Other | Attending: Emergency Medicine | Admitting: Emergency Medicine

## 2011-02-07 DIAGNOSIS — H5710 Ocular pain, unspecified eye: Secondary | ICD-10-CM | POA: Insufficient documentation

## 2011-02-07 DIAGNOSIS — H04309 Unspecified dacryocystitis of unspecified lacrimal passage: Secondary | ICD-10-CM | POA: Insufficient documentation

## 2011-02-07 HISTORY — DX: Essential (primary) hypertension: I10

## 2011-02-07 HISTORY — DX: Unspecified cirrhosis of liver: K74.60

## 2011-02-07 HISTORY — DX: Gastro-esophageal reflux disease without esophagitis: K21.9

## 2011-02-07 HISTORY — DX: Scoliosis, unspecified: M41.9

## 2011-02-07 MED ORDER — SULFAMETHOXAZOLE-TMP DS 800-160 MG PO TABS
1.0000 | ORAL_TABLET | Freq: Once | ORAL | Status: AC
  Administered 2011-02-07: 1 via ORAL
  Filled 2011-02-07: qty 1

## 2011-02-07 MED ORDER — SULFAMETHOXAZOLE-TRIMETHOPRIM 800-160 MG PO TABS: 1.0000 | ORAL_TABLET | Freq: Two times a day (BID) | ORAL | Status: AC

## 2011-02-07 NOTE — ED Notes (Signed)
Pt saw her opthalmologist regarding eye on Friday past.

## 2011-02-07 NOTE — ED Provider Notes (Addendum)
History     CSN: [id number] Arrival date & time: 02/07/2011 10:41 AM   No chief complaint on file.    (Include location/radiation/quality/duration/timing/severity/associated sxs/prior treatment) Patient is a 77 y.o. female presenting with rash. The history is provided by the patient.  Rash  The current episode started more than 1 week ago. The problem has been gradually worsening.   the patient noticed redness and swelling in the left medial canthus.  She did see her doctor on Friday who started her on Keflex antibiotic. She has been compliant with that but has not been seeing any improvement in his condition. She denies any fevers headache vomiting or any active drainage. She apparently has had this problem in the past. Patient denies any visual changes or any specific nasal congestion.   No past medical history on file.   No past surgical history on file.  No family history on file.  History  Substance Use Topics  . Smoking status: Not on file  . Smokeless tobacco: Not on file  . Alcohol Use: Not on file    OB History    Grav Para Term Preterm Abortions TAB SAB Ect Mult Living                  Review of Systems  Skin: Positive for rash.  All other systems reviewed and are negative.    Allergies  Review of patient's allergies indicates not on file.  Home Medications  No current outpatient prescriptions on file.  Physical Exam    BP 152/74  Pulse 77  Temp(Src) 98.4 F (36.9 C) (Oral)  Resp 20  SpO2 100%  Physical Exam  Constitutional: She is oriented to person, place, and time. She appears well-developed and well-nourished.  HENT:  Head: Normocephalic and atraumatic.  Eyes: Conjunctivae and EOM are normal. Pupils are equal, round, and reactive to light.       Redness and swelling to the left medial canthus. Diffuse tenderness noted. There is no sign of any significant orbital cellulitis. No active drainage or bleeding.  Neck: Neck supple.    Cardiovascular: Normal rate and regular rhythm.  Exam reveals no gallop and no friction rub.   No murmur heard. Pulmonary/Chest: Breath sounds normal. She has no wheezes. She has no rales. She exhibits no tenderness.  Abdominal: Soft. Bowel sounds are normal. She exhibits no distension. There is no tenderness. There is no rebound and no guarding.  Musculoskeletal: Normal range of motion.  Neurological: She is alert and oriented to person, place, and time. No cranial nerve deficit. Coordination normal.  Skin: Skin is warm and dry. No rash noted.  Psychiatric: She has a normal mood and affect.    ED Course  Procedures  Results for orders placed in visit on 06/01/10  CBC WITH DIFFERENTIAL      Component Value Range   WBC 12.6 (*) 4.5 - 10.5 (K/uL)   RBC 4.42  3.87 - 5.11 (Mil/uL)   Hemoglobin 13.9  12.0 - 15.0 (g/dL)   HCT 40.9  81.1 - 91.4 (%)   MCV 90.9  78.0 - 100.0 (fl)   MCHC 34.5  30.0 - 36.0 (g/dL)   RDW 78.2  95.6 - 21.3 (%)   Platelets 340.0  150.0 - 400.0 (K/uL)   Neutrophils Relative 76.5  43.0 - 77.0 (%)   Lymphocytes Relative 16.1  12.0 - 46.0 (%)   Monocytes Relative 6.7  3.0 - 12.0 (%)   Eosinophils Relative 0.6  0.0 - 5.0 (%)  Basophils Relative 0.1  0.0 - 3.0 (%)   Neutro Abs 9.6 (*) 1.4 - 7.7 (K/uL)   Lymphs Abs 2.0  0.7 - 4.0 (K/uL)   Monocytes Absolute 0.8  0.1 - 1.0 (K/uL)   Eosinophils Absolute 0.1  0.0 - 0.7 (K/uL)   Basophils Absolute 0.0  0.0 - 0.1 (K/uL)  COMPREHENSIVE METABOLIC PANEL      Component Value Range   Sodium 130 (*) 135 - 145 (mEq/L)   Potassium 3.5  3.5 - 5.1 (mEq/L)   Chloride 91 (*) 96 - 112 (mEq/L)   CO2 31  19 - 32 (mEq/L)   Glucose, Bld 90  70 - 99 (mg/dL)   BUN 10  6 - 23 (mg/dL)   Creatinine, Ser 0.7  0.4 - 1.2 (mg/dL)   Total Bilirubin 0.6  0.3 - 1.2 (mg/dL)   Alkaline Phosphatase 165 (*) 39 - 117 (U/L)   AST 22  0 - 37 (U/L)   ALT 16  0 - 35 (U/L)   Total Protein 6.8  6.0 - 8.3 (g/dL)   Albumin 3.3 (*) 3.5 - 5.2 (g/dL)    Calcium 9.3  8.4 - 10.5 (mg/dL)   GFR 16.10  >96.04 (mL/min)  VITAMIN B12      Component Value Range   Vitamin B-12 496  211 - 911 (pg/mL)   No results found.   No diagnosis found.   MDM Pt is seen and examined;  Initial history and physical completed.  Will follow.    On-call ophthalmology is paged to discuss further treatment     Lallie Strahm A. Patrica Duel, MD 02/07/11 1100  Amariya Liskey A. Patrica Duel, MD 02/07/11 1101  Ophthalmology on call was contacted, Dr Maris Berger, recommends changing to a different antibiotic, namely Bactrim. Dr. Charlotte Sanes is also kind enough to see this patient at 8:30 AM tomorrow morning. Patient was given this information. She is already on oxycodone she'll be discharged home in stable condition at this time thank you  Quint Chestnut A. Patrica Duel, MD 02/07/11 1113

## 2011-02-07 NOTE — ED Notes (Signed)
Pt's inner left eye is red and indurated.  Minimal drainage noted.  Pt denies fever, N/V.  Pt denies changes in vision.  Pt states she has had similar episodes in past.

## 2011-02-07 NOTE — ED Notes (Signed)
Contacted Dr Myrtie Hawk office per Dr Jana Half to see if pt can been seen there today as oppossed to tomorrow. Office states she can be seen there at 1315 today

## 2011-04-25 NOTE — Telephone Encounter (Signed)
  Phone Note Outgoing Call   Call placed by: Lavell Islam RN,  September 08, 2010 10:50 AM Summary of Call: No answer; requested that patient call us to report status today. Initial call taken by: Lavell Islam RN,  September 08, 2010 10:51 AM

## 2011-04-25 NOTE — Telephone Encounter (Signed)
  Phone Note Outgoing Call   Call placed by: Clemens Catholic LPN,  September 08, 2010 7:32 PM Call placed to: Patient Summary of Call: spoke to pt given lab results. per dr Cathren Harsh labs ok except low sodium, we will fax a copy to the pts PCP. pt understands. Initial call taken by: Clemens Catholic LPN,  September 08, 2010 7:32 PM

## 2011-04-25 NOTE — Progress Notes (Signed)
Summary: SWOLLEN FEET/TJ   Vital Signs:  Patient Profile:   77 Years Old Female CC:      bilateral feet and ankle swelling x 3 days Height:     59 inches Weight:      177 pounds O2 Sat:      97 % O2 treatment:    Room Air Temp:     98.6 degrees F oral Pulse rate:   98 / minute Resp:     16 per minute BP sitting:   140 / 88  (left arm) Cuff size:    regular  Vitals Entered By: Lajean Saver RN (September 07, 2010 2:53 PM)                  Updated Prior Medication List: METOPROLOL SUCCINATE 25 MG XR24H-TAB (METOPROLOL SUCCINATE)  NEXIUM 40 MG CPDR (ESOMEPRAZOLE MAGNESIUM) one tablet by mouth once daily CENTRUM  TABS (MULTIPLE VITAMINS-MINERALS) one tablet by mouth once daily OXYCODONE HCL 5 MG TABS (OXYCODONE HCL) one tablet by mouth three times a day as needed DIAZEPAM 5 MG TABS (DIAZEPAM) one tablet by mouth three times a day as needed AMLODIPINE BESYLATE 5 MG TABS (AMLODIPINE BESYLATE) one tablet by mouth once daily TRAZODONE HCL 150 MG TABS (TRAZODONE HCL) one tablet by mouth at bedtime VITAMIN C 500 MG  TABS (ASCORBIC ACID) one tablet by mouth once daily MAGNESIUM OXIDE 400 MG CAPS (MAGNESIUM OXIDE) one tablet by mouth once daily FISH OIL   OIL (FISH OIL) one tablet by mouth once daily (out) GLUCOSAMINE-MSM 1500-500 MG/30ML LIQD (GLUCOSAMINE HCL-MSM) 2 by mouth once daily * STEROID INJECTION (DRUG UNKNOWN) given in the hip for arthritis as needed CLARITIN 10 MG TABS (LORATADINE) as needed TRIAMTERENE-HCTZ 37.5-25 MG CAPS (TRIAMTERENE-HCTZ)   Current Allergies (reviewed today): ! * URSO FORTEHistory of Present Illness Chief Complaint: bilateral feet and ankle swelling x 3 days History of Present Illness: Bilateral swollen feet and ankles for the last few days.  She was on her feet a lot about 4 days ago, the next day had increased swelling.  She has baseline swelling, this is worse.  No CP, SOB, dyspnea, orthopnea or other new symptoms.  She feels she is getting better  slowly with 3 days ago being the worst day.  She is on Maxide for BP control that her PCP also uses for feet swelling.  Although she is unsure if she's still supposed to be taking it.  She has PBC and is followed by GI.  Last blood work about 2 months ago.    REVIEW OF SYSTEMS Constitutional Symptoms      Denies fever, chills, night sweats, weight loss, weight gain, and fatigue.  Eyes       Denies change in vision, eye pain, eye discharge, glasses, contact lenses, and eye surgery. Ear/Nose/Throat/Mouth       Denies hearing loss/aids, change in hearing, ear pain, ear discharge, dizziness, frequent runny nose, frequent nose bleeds, sinus problems, sore throat, hoarseness, and tooth pain or bleeding.  Respiratory       Denies dry cough, productive cough, wheezing, shortness of breath, asthma, bronchitis, and emphysema/COPD.  Cardiovascular       Denies murmurs, chest pain, and tires easily with exhertion.    Gastrointestinal       Denies stomach pain, nausea/vomiting, diarrhea, constipation, blood in bowel movements, and indigestion. Genitourniary       Denies painful urination, blood or discharge from vagina, kidney stones, and loss of urinary control. Neurological  Denies paralysis, seizures, and fainting/blackouts. Musculoskeletal       Complains of swelling.      Denies muscle pain, joint pain, joint stiffness, decreased range of motion, redness, muscle weakness, and gout.      Comments: bilateral ankles and feet Skin       Denies bruising, unusual mles/lumps or sores, and hair/skin or nail changes.  Psych       Denies mood changes, temper/anger issues, anxiety/stress, speech problems, depression, and sleep problems. Other Comments: Patient "was on my feet for 2 days" and since Saturday (3 days) has had lower extremity swelling and discoloration. She says it is improvedc from saturday. She has sensation in both feet, edema present in both feet/ankles, and mild discoloration   Past  History:  Past Medical History: Reviewed history from 02/03/2010 and no changes required. Diabetes mellitus, type I Hypertension Acid Reflux morbid obesity Depression  Past Surgical History: Reviewed history from 02/03/2010 and no changes required. Tonsillectomy Caesarean section Cosmetic nose surgery Ankle Surgery - Broken Broken Jaw Hand/Eye Surgery Knee Replacement    Family History: Reviewed history from 02/03/2010 and no changes required. Family History Hypertension Dementia no liver disease in family  Social History: Never Smoked Alcohol use-no Drug use-no Regular exercise-no she is single, she has one child lives alone Physical Exam General appearance: well developed, well nourished, no acute distress Chest/Lungs: no rales, wheezes, or rhonchi bilateral, breath sounds equal without effort Heart: regular rate and  rhythm, no murmur MSE: oriented to time, place, and person Biltaeral feet swelling, non pitting edema mostly in feet and ankles.  Mildly tender ankles as well bilaterally.  Mild hyperpigmentation medial aspects.  Sensation intact.  Normal DP/PT pulses.  No leg tenderness or signs of DVT.   Assessment New Problems: LEG EDEMA, BILATERAL (ICD-782.3)   Plan New Orders: Est. Patient Level IV [99214] T-CBC w/Diff [[id number]] T-Comprehensive Metabolic Panel [[id number]] T-BNP  (B Natriuretic Peptide) [phone] Planning Comments:   I don't know why her feet are swollen.  Likely is due to walking around a lot last Friday.  Will check CBC, CMP, and BNP to assess electrolytes, liver enzymes, etc to see if there is a medical reason.   I really don't think this is due to DVT since is bilateral. Have advised elevation, resting, ACE bandage or compression stockings, ice, and rest.  Should be following up with her PCP to discuss lab results.  May also want to discuss with GI in case her PBC is causing this. If worsening symptoms (CP, SOB, more swelling),  should go to the ER.  Follow Up: Follow up with Primary Physician  The patient and/or caregiver has been counseled thoroughly with regard to medications prescribed including dosage, schedule, interactions, rationale for use, and possible side effects and they verbalize understanding.  Diagnoses and expected course of recovery discussed and will return if not improved as expected or if the condition worsens. Patient and/or caregiver verbalized understanding.   Orders Added: 1)  Est. Patient Level IV [46962] 2)  T-CBC w/Diff [[id number]] 3)  T-Comprehensive Metabolic Panel [[id number]] 4)  T-BNP  (B Natriuretic Peptide) [[id number]]

## 2011-10-11 ENCOUNTER — Ambulatory Visit: Payer: Self-pay | Admitting: Family Medicine

## 2011-10-18 ENCOUNTER — Emergency Department (HOSPITAL_COMMUNITY)
Admission: EM | Admit: 2011-10-18 | Discharge: 2011-10-18 | Disposition: A | Discharge status: Home / Self Care | Payer: Medicare Other | Attending: Emergency Medicine | Admitting: Emergency Medicine

## 2011-10-18 ENCOUNTER — Emergency Department (HOSPITAL_COMMUNITY): Payer: Medicare Other

## 2011-10-18 ENCOUNTER — Encounter (HOSPITAL_COMMUNITY): Payer: Self-pay | Admitting: *Deleted

## 2011-10-18 DIAGNOSIS — I10 Essential (primary) hypertension: Secondary | ICD-10-CM | POA: Insufficient documentation

## 2011-10-18 DIAGNOSIS — K7460 Unspecified cirrhosis of liver: Secondary | ICD-10-CM | POA: Insufficient documentation

## 2011-10-18 DIAGNOSIS — K219 Gastro-esophageal reflux disease without esophagitis: Secondary | ICD-10-CM | POA: Insufficient documentation

## 2011-10-18 DIAGNOSIS — R05 Cough: Secondary | ICD-10-CM | POA: Insufficient documentation

## 2011-10-18 DIAGNOSIS — R059 Cough, unspecified: Secondary | ICD-10-CM | POA: Insufficient documentation

## 2011-10-18 DIAGNOSIS — R0789 Other chest pain: Secondary | ICD-10-CM | POA: Insufficient documentation

## 2011-10-18 DIAGNOSIS — Z79899 Other long term (current) drug therapy: Secondary | ICD-10-CM | POA: Insufficient documentation

## 2011-10-18 LAB — COMPREHENSIVE METABOLIC PANEL
Albumin: 3.2 g/dL — ABNORMAL LOW (ref 3.5–5.2)
Alkaline Phosphatase: 162 U/L — ABNORMAL HIGH (ref 39–117)
BUN: 7 mg/dL (ref 6–23)
Calcium: 9.5 mg/dL (ref 8.4–10.5)
Creatinine, Ser: 0.73 mg/dL (ref 0.50–1.10)
GFR calc Af Amer: >90 mL/min (ref >90)
Glucose, Bld: 101 mg/dL — ABNORMAL HIGH (ref 70–99)
Total Protein: 7.3 g/dL (ref 6.0–8.3)

## 2011-10-18 LAB — DIFFERENTIAL
Basophils Absolute: 0 10*3/uL (ref 0.0–0.1)
Lymphocytes Relative: 24 % (ref 12–46)
Lymphs Abs: 3.1 10*3/uL (ref 0.7–4.0)
Monocytes Absolute: 0.9 10*3/uL (ref 0.1–1.0)
Neutro Abs: 8.7 10*3/uL — ABNORMAL HIGH (ref 1.7–7.7)

## 2011-10-18 LAB — CBC
HCT: 39.2 % (ref 36.0–46.0)
MCV: 83.8 fL (ref 78.0–100.0)
RBC: 4.68 MIL/uL (ref 3.87–5.11)
RDW: 12.6 % (ref 11.5–15.5)
WBC: 12.7 10*3/uL — ABNORMAL HIGH (ref 4.0–10.5)

## 2011-10-18 LAB — POCT I-STAT TROPONIN I: Troponin i, poc: 0 ng/mL (ref 0.00–0.08)

## 2011-10-18 NOTE — Discharge Instructions (Signed)
Chest Pain (Nonspecific) It is often hard to give a specific diagnosis for the cause of chest pain. There is always a chance that your pain could be related to something serious, such as a heart attack or a blood clot in the lungs. You need to follow up with your caregiver for further evaluation. CAUSES   Heartburn.   Pneumonia or bronchitis.   Anxiety or stress.   Inflammation around your heart (pericarditis) or lung (pleuritis or pleurisy).   A blood clot in the lung.   A collapsed lung (pneumothorax). It can develop suddenly on its own (spontaneous pneumothorax) or from injury (trauma) to the chest.   Shingles infection (herpes zoster virus).  The chest wall is composed of bones, muscles, and cartilage. Any of these can be the source of the pain.  The bones can be bruised by injury.   The muscles or cartilage can be strained by coughing or overwork.   The cartilage can be affected by inflammation and become sore (costochondritis).  DIAGNOSIS  Lab tests or other studies, such as X-rays, electrocardiography, stress testing, or cardiac imaging, may be needed to find the cause of your pain.  TREATMENT   Treatment depends on what may be causing your chest pain. Treatment may include:   Acid blockers for heartburn.   Anti-inflammatory medicine.   Pain medicine for inflammatory conditions.   Antibiotics if an infection is present.   You may be advised to change lifestyle habits. This includes stopping smoking and avoiding alcohol, caffeine, and chocolate.   You may be advised to keep your head raised (elevated) when sleeping. This reduces the chance of acid going backward from your stomach into your esophagus.   Most of the time, nonspecific chest pain will improve within 2 to 3 days with rest and mild pain medicine.  HOME CARE INSTRUCTIONS   If antibiotics were prescribed, take your antibiotics as directed. Finish them even if you start to feel better.   For the next few  days, avoid physical activities that bring on chest pain. Continue physical activities as directed.   Do not smoke.   Avoid drinking alcohol.   Only take over-the-counter or prescription medicine for pain, discomfort, or fever as directed by your caregiver.   Follow your caregiver's suggestions for further testing if your chest pain does not go away.   Keep any follow-up appointments you made. If you do not go to an appointment, you could develop lasting (chronic) problems with pain. If there is any problem keeping an appointment, you must call to reschedule.  SEEK MEDICAL CARE IF:   You think you are having problems from the medicine you are taking. Read your medicine instructions carefully.   Your chest pain does not go away, even after treatment.   You develop a rash with blisters on your chest.  SEEK IMMEDIATE MEDICAL CARE IF:   You have increased chest pain or pain that spreads to your arm, neck, jaw, back, or abdomen.   You develop shortness of breath, an increasing cough, or you are coughing up blood.   You have severe back or abdominal pain, feel nauseous, or vomit.   You develop severe weakness, fainting, or chills.   You have a fever.  THIS IS AN EMERGENCY. Do not wait to see if the pain will go away. Get medical help at once. Call your local emergency services (911 in [place] Do not drive yourself to the hospital. MAKE SURE YOU:   Understand these instructions.     Will watch your condition.   Will get help right away if you are not doing well or get worse.  Document Released: 02/16/2005 Document Revised: 04/28/2011 Document Reviewed: 12/13/2007 [employer] Patient Information 2012 [employer]. 

## 2011-10-18 NOTE — ED Notes (Signed)
Pt is currently going to Radiology

## 2011-10-18 NOTE — ED Notes (Signed)
Pt states that for about a month now she has been having pain starting from her lower back and radiating to the right side of her chest under her breast. She states it starts around 4 a.m.

## 2011-10-18 NOTE — ED Provider Notes (Signed)
History     CSN: [id number]  Arrival date & time 10/18/11  1316   First MD Initiated Contact with Patient 10/18/11 1519      Chief Complaint  Patient presents with  . Chest Pain    right side chest and abd pain    (Consider location/radiation/quality/duration/timing/severity/associated sxs/prior treatment) HPI Comments: Patient states pain in the right side of the upper abd and chest.  There is no shortness of breath.    Patient is a 78 y.o. female presenting with chest pain. The history is provided by the patient.  Chest Pain Episode onset: several weeks ago. Chest pain occurs intermittently. The chest pain is worsening. The pain is associated with breathing and coughing (palpation). The severity of the pain is moderate. The quality of the pain is described as aching. The pain does not radiate. Chest pain is worsened by certain positions. Primary symptoms include cough. Pertinent negatives for primary symptoms include no fever, no nausea, no vomiting and no dizziness.     Past Medical History  Diagnosis Date  . Back pain Chronic back pain  . Scoliosis   . Liver cirrhosis   . Hypertension   . Acid reflux     Past Surgical History  Procedure Date  . Knee repalcement   . Replacement total knee     No family history on file.  History  Substance Use Topics  . Smoking status: Former Games developer  . Smokeless tobacco: Not on file  . Alcohol Use: No    OB History    Grav Para Term Preterm Abortions TAB SAB Ect Mult Living                  Review of Systems  Constitutional: Negative for fever.  Respiratory: Positive for cough.   Cardiovascular: Positive for chest pain.  Gastrointestinal: Negative for nausea and vomiting.  Neurological: Negative for dizziness.  All other systems reviewed and are negative.    Allergies  Aspirin and Penicillins  Home Medications   Current Outpatient Rx  Name Route Sig Dispense Refill  . VITAMIN C 1000 MG PO TABS Oral Take 1,000  mg by mouth daily.      Marland Kitchen DIAZEPAM 5 MG PO TABS Oral Take 5 mg by mouth every 8 (eight) hours as needed.     Marland Kitchen ESOMEPRAZOLE MAGNESIUM 40 MG PO CPDR Oral Take 40 mg by mouth daily before breakfast.      . GLUCOSAMINE-CHONDROITIN 500-400 MG PO TABS Oral Take 1 tablet by mouth 3 (three) times daily.      Marland Kitchen LORATADINE 10 MG PO TABS Oral Take 10 mg by mouth daily.      Marland Kitchen MAGNESIUM OXIDE 400 MG PO TABS Oral Take 400 mg by mouth daily.      Marland Kitchen METOPROLOL SUCCINATE ER 50 MG PO TB24 Oral Take 50 mg by mouth daily. Take with or immediately following a meal.    . ADULT MULTIVITAMIN W/MINERALS CH Oral Take 1 tablet by mouth daily.    . OXYCODONE HCL 5 MG PO TABS Oral Take 5 mg by mouth every 4 (four) hours as needed. For pain    . TRAZODONE HCL 150 MG PO TABS Oral Take 150 mg by mouth at bedtime.        BP 183/59  Pulse 64  Temp(Src) 98.3 F (36.8 C) (Oral)  Resp 18  SpO2 99%  Physical Exam  Nursing note and vitals reviewed. Constitutional: She is oriented to person, place, and time. She  appears well-developed and well-nourished. No distress.  HENT:  Head: Normocephalic and atraumatic.  Neck: Normal range of motion. Neck supple.  Cardiovascular: Normal rate and regular rhythm.  Exam reveals no gallop and no friction rub.   No murmur heard. Pulmonary/Chest: Effort normal and breath sounds normal. No respiratory distress. She has no wheezes.  Abdominal: Soft. Bowel sounds are normal. She exhibits no distension. There is no tenderness.  Musculoskeletal: Normal range of motion.  Neurological: She is alert and oriented to person, place, and time.  Skin: Skin is warm and dry. She is not diaphoretic.    ED Course  Procedures (including critical care time)  Labs Reviewed  COMPREHENSIVE METABOLIC PANEL - Abnormal; Notable for the following:    Sodium 128 (*)    Chloride 89 (*)    Glucose, Bld 101 (*)    Albumin 3.2 (*)    Alkaline Phosphatase 162 (*)    GFR calc non Af Amer 81 (*)    All other  components within normal limits  CBC - Abnormal; Notable for the following:    WBC 12.7 (*)    All other components within normal limits  DIFFERENTIAL - Abnormal; Notable for the following:    Neutro Abs 8.7 (*)    All other components within normal limits  POCT I-STAT TROPONIN I   Dg Chest 2 View  10/18/2011  *RADIOLOGY REPORT*  Clinical Data: Chest pain  CHEST - 2 VIEW  Comparison: 02/26/2009  Findings: Mild cardiomegaly.  The normal vascularity.  Increased AP diameter of the chest and bronchitic changes likely related to COPD.  Chronic left-sided rib deformities.  Osteopenia.  IMPRESSION: Cardiomegaly and changes related to COPD.  Original Report Authenticated By: Donavan Burnet, M.D.     No diagnosis found.   Date: 10/18/2011  Rate: 64  Rhythm: normal sinus rhythm  QRS Axis: normal  Intervals: normal  ST/T Wave abnormalities: normal  Conduction Disutrbances:none  Narrative Interpretation:   Old EKG Reviewed: unchanged    MDM  The ekg, labs, and chest xray are all unremarkable with the exception of a mildly elevated wbc count.  This is consistent with prior results.  I proceeded with an US of the abd to rule out cholelithiasis which was negative.  Her symptoms are improved with elevating her right breast.  It does not appear as anything emergent is going on.  I will discharge her to home with prompt follow up with her pcp.  To return if worsens.          Geoffery Lyons, MD 10/18/11 [phone]

## 2011-10-18 NOTE — ED Notes (Signed)
Pt states that she is having right chest, back, and RUQ pain for a while.  Pt states feels like something is going to bust in there.  No pulsating mass seen

## 2012-11-26 ENCOUNTER — Emergency Department (HOSPITAL_COMMUNITY)
Admission: EM | Admit: 2012-11-26 | Discharge: 2012-11-26 | Disposition: A | Discharge status: Home / Self Care | Payer: Medicare Other | Attending: Emergency Medicine | Admitting: Emergency Medicine

## 2012-11-26 ENCOUNTER — Encounter (HOSPITAL_COMMUNITY): Payer: Self-pay | Admitting: Adult Health

## 2012-11-26 DIAGNOSIS — Z880 Allergy status to penicillin: Secondary | ICD-10-CM | POA: Insufficient documentation

## 2012-11-26 DIAGNOSIS — I10 Essential (primary) hypertension: Secondary | ICD-10-CM | POA: Insufficient documentation

## 2012-11-26 DIAGNOSIS — Z8739 Personal history of other diseases of the musculoskeletal system and connective tissue: Secondary | ICD-10-CM | POA: Insufficient documentation

## 2012-11-26 DIAGNOSIS — H04309 Unspecified dacryocystitis of unspecified lacrimal passage: Secondary | ICD-10-CM | POA: Insufficient documentation

## 2012-11-26 DIAGNOSIS — Z8719 Personal history of other diseases of the digestive system: Secondary | ICD-10-CM | POA: Insufficient documentation

## 2012-11-26 DIAGNOSIS — H04302 Unspecified dacryocystitis of left lacrimal passage: Secondary | ICD-10-CM

## 2012-11-26 DIAGNOSIS — K219 Gastro-esophageal reflux disease without esophagitis: Secondary | ICD-10-CM | POA: Insufficient documentation

## 2012-11-26 DIAGNOSIS — Z87891 Personal history of nicotine dependence: Secondary | ICD-10-CM | POA: Insufficient documentation

## 2012-11-26 DIAGNOSIS — Z79899 Other long term (current) drug therapy: Secondary | ICD-10-CM | POA: Insufficient documentation

## 2012-11-26 DIAGNOSIS — Z96659 Presence of unspecified artificial knee joint: Secondary | ICD-10-CM | POA: Insufficient documentation

## 2012-11-26 LAB — CBC WITH DIFFERENTIAL/PLATELET
Hemoglobin: 14.4 g/dL (ref 12.0–15.0)
Lymphocytes Relative: 21 % (ref 12–46)
Lymphs Abs: 2.8 10*3/uL (ref 0.7–4.0)
MCV: 87.5 fL (ref 78.0–100.0)
Monocytes Relative: 9 % (ref 3–12)
Neutrophils Relative %: 70 % (ref 43–77)
Platelets: 335 10*3/uL (ref 150–400)
RBC: 4.71 MIL/uL (ref 3.87–5.11)
WBC: 13.5 10*3/uL — ABNORMAL HIGH (ref 4.0–10.5)

## 2012-11-26 LAB — BASIC METABOLIC PANEL
CO2: 28 mEq/L (ref 19–32)
Glucose, Bld: 131 mg/dL — ABNORMAL HIGH (ref 70–99)
Potassium: 3.5 mEq/L (ref 3.5–5.1)
Sodium: 133 mEq/L — ABNORMAL LOW (ref 135–145)

## 2012-11-26 MED ORDER — CLINDAMYCIN HCL 300 MG PO CAPS: 300.0000 mg | ORAL_CAPSULE | Freq: Four times a day (QID) | ORAL | Status: DC

## 2012-11-26 MED ORDER — DEXTROSE 5 % IV SOLN
1.0000 g | Freq: Once | INTRAVENOUS | Status: AC
  Administered 2012-11-26: 1 g via INTRAVENOUS
  Filled 2012-11-26: qty 10

## 2012-11-26 MED ORDER — CLINDAMYCIN PHOSPHATE 600 MG/50ML IV SOLN
600.0000 mg | Freq: Once | INTRAVENOUS | Status: AC
  Administered 2012-11-26: 600 mg via INTRAVENOUS
  Filled 2012-11-26: qty 50

## 2012-11-26 NOTE — ED Notes (Signed)
Pt presents with left lower eye lid swelling, redness and erythema that began last week and has gotten progressively worse. Pt reports that her eye doctor dx her with dacrocystitis, and told her to make an appouintment with a doctor [facility] but she reports that she can not wait that long. She is currently taking duricef 500 mg and vigamox drops. She states the redness and pain is worse and she called the eye doctor who told her to come here to have it lanced.  Denies fevers.

## 2012-11-26 NOTE — ED Provider Notes (Signed)
INCISION AND DRAINAGE Date/Time: 11/27/2012 1:51 AM Performed by: Audelia Hives Authorized by: Doug Sou Consent: Verbal consent obtained. Risks and benefits: risks, benefits and alternatives were discussed Consent given by: patient Time out: Immediately prior to procedure a "time out" was called to verify the correct patient, procedure, equipment, support staff and site/side marked as required. Type: abscess Body area: head/neck Location details: left eyelid Anesthesia: local infiltration Local anesthetic: lidocaine 1% without epinephrine Anesthetic total: 2 ml Patient sedated: no Scalpel size: 11 Incision type: single straight Complexity: simple Drainage: purulent Drainage amount: copious Wound treatment: wound left open Patient tolerance: Patient tolerated the procedure well with no immediate complications.   Audelia Hives, MD 11/27/12 [phone]

## 2012-11-26 NOTE — ED Notes (Signed)
  Pt has been seeing eye doctor and is on PO and opthalmic antibiotics. Has also been using hot pack consisting of rice in a clean sock microwaved for heat.

## 2012-11-26 NOTE — ED Provider Notes (Signed)
History    CSN: [id number] Arrival date & time 11/26/12  1619  First MD Initiated Contact with Patient 11/26/12 1950     Chief Complaint  Patient presents with  . Abscess   (Consider location/radiation/quality/duration/timing/severity/associated sxs/prior Treatment) HPI   Presents with pain and swelling at left infraorbital area onset approximately 2 weeks ago. She's been seen by her primary care physician referred to an ophthalmologist at [facility]. She did not have an appointment until August 14. Therefore she presents here as she feels the pain and swelling cannot wait. She's been treated with eyedrops, without relief. She denies other complaint. Denies pain on extraocular movement. Denies visual changes. Pain is worse with palpation. Improved by anything.  Past Medical History  Diagnosis Date  . Back pain Chronic back pain  . Scoliosis   . Liver cirrhosis   . Hypertension   . Acid reflux    Past Surgical History  Procedure Laterality Date  . Knee repalcement    . Replacement total knee     History reviewed. No pertinent family history. History  Substance Use Topics  . Smoking status: Former Games developer  . Smokeless tobacco: Not on file  . Alcohol Use: No   OB History   Grav Para Term Preterm Abortions TAB SAB Ect Mult Living                 Review of Systems  Constitutional: Negative.   HENT: Negative.   Respiratory: Negative.   Cardiovascular: Negative.   Gastrointestinal: Negative.   Musculoskeletal: Negative.   Skin: Positive for wound.       Redness and swelling left infraorbital area.  Neurological: Negative.   Psychiatric/Behavioral: Negative.   All other systems reviewed and are negative.    Allergies  Aspirin; Tylenol; and Penicillins  Home Medications   Current Outpatient Rx  Name  Route  Sig  Dispense  Refill  . cefadroxil (DURICEF) 500 MG capsule   Oral   Take 500 mg by mouth 2 (two) times daily. Started 11/22/12, for 10 days, ending 12/01/12.          . diazepam (VALIUM) 5 MG tablet   Oral   Take 5 mg by mouth every 8 (eight) hours as needed for anxiety.          Marland Kitchen esomeprazole (NEXIUM) 40 MG capsule   Oral   Take 40 mg by mouth daily before breakfast.          . glucosamine-chondroitin 500-400 MG tablet   Oral   Take 1 tablet by mouth 3 (three) times daily.          . magnesium oxide (MAG-OX) 400 MG tablet   Oral   Take 400 mg by mouth daily.           . metoprolol succinate (TOPROL-XL) 50 MG 24 hr tablet   Oral   Take 50 mg by mouth daily. Take with or immediately following a meal.         . moxifloxacin (VIGAMOX) 0.5 % ophthalmic solution      1 drop 3 (three) times daily.         . Multiple Vitamin (MULITIVITAMIN WITH MINERALS) TABS   Oral   Take 1 tablet by mouth daily.         Marland Kitchen oxyCODONE (OXY IR/ROXICODONE) 5 MG immediate release tablet   Oral   Take 5 mg by mouth every 4 (four) hours as needed. For pain         .  traZODone (DESYREL) 150 MG tablet   Oral   Take 150 mg by mouth at bedtime.            BP 152/90  Pulse 86  Temp(Src) 98.2 F (36.8 C) (Oral)  Resp 16  SpO2 95% Physical Exam  Nursing note and vitals reviewed. Constitutional: She appears well-developed and well-nourished.  HENT:  Head: Normocephalic and atraumatic.  There is a marble approximately 1 cm diameter fluctuant area at the left infraorbital area, inferior to the medial canthus. Not involving eyelids. With redness and tenderness extending approximately 5 cm inferiorly to the left cheek.  Eyes: Conjunctivae are normal. Pupils are equal, round, and reactive to light.  Neck: Neck supple. No tracheal deviation present. No thyromegaly present.  Cardiovascular: Normal rate and regular rhythm.   No murmur heard. Pulmonary/Chest: Effort normal and breath sounds normal.  Abdominal: Soft. Bowel sounds are normal. She exhibits no distension. There is no tenderness.  Musculoskeletal: Normal range of motion. She  exhibits no edema and no tenderness.  Lymphadenopathy:    She has no cervical adenopathy.  Neurological: She is alert. Coordination normal.  Skin: Skin is warm and dry. No rash noted.  Psychiatric: She has a normal mood and affect.    ED Course  Procedures (including critical care time) Labs Reviewed  CBC WITH DIFFERENTIAL - Abnormal; Notable for the following:    WBC 13.5 (*)    Neutro Abs 9.4 (*)    Monocytes Absolute 1.2 (*)    All other components within normal limits  BASIC METABOLIC PANEL - Abnormal; Notable for the following:    Sodium 133 (*)    Chloride 94 (*)    Glucose, Bld 131 (*)    GFR calc non Af Amer 80 (*)    All other components within normal limits   No results found. No diagnosis found. Discussed with Dr. Allyne Gee, ophthalmology. We will open abscess near the change antibiotic to clindamycin. MDM  Abscess with a nick incision by emergency resident physician. I was in direct supervision during entire procedure. a large amount of pus drained from the wound procedure. Plan Dr. Allyne Gee to see patient in office tomorrow Prescription clindamycin 300 mg 3 times a day Diagnosis acute dacryocystitis left eye   Doug Sou, MD 11/26/12 2225

## 2012-11-27 NOTE — ED Provider Notes (Signed)
I have personally seen and examined the patient.  I have discussed the plan of care with the resident.  I have reviewed the documentation on PMH/FH/Soc. History.  I have reviewed the documentation of the resident and agree. Resident performed procedureonly. I performed history and physical and allother treatment and disposition.I was presentin room with patient for entire procedure  Doug Sou, MD 11/27/12 1506

## 2013-01-18 ENCOUNTER — Other Ambulatory Visit: Payer: Self-pay | Admitting: Ophthalmology

## 2013-05-14 ENCOUNTER — Inpatient Hospital Stay (HOSPITAL_COMMUNITY): Payer: Medicare Other

## 2013-05-14 ENCOUNTER — Emergency Department (HOSPITAL_COMMUNITY): Payer: Medicare Other

## 2013-05-14 ENCOUNTER — Inpatient Hospital Stay (HOSPITAL_COMMUNITY)
Admission: EM | Admit: 2013-05-14 | Discharge: 2013-05-19 | DRG: 065 | Disposition: A | Discharge status: Home Health | Payer: Medicare Other | Attending: Internal Medicine | Admitting: Internal Medicine

## 2013-05-14 ENCOUNTER — Encounter (HOSPITAL_COMMUNITY): Payer: Self-pay | Admitting: Emergency Medicine

## 2013-05-14 DIAGNOSIS — R4789 Other speech disturbances: Secondary | ICD-10-CM | POA: Diagnosis present

## 2013-05-14 DIAGNOSIS — Z87891 Personal history of nicotine dependence: Secondary | ICD-10-CM

## 2013-05-14 DIAGNOSIS — K219 Gastro-esophageal reflux disease without esophagitis: Secondary | ICD-10-CM | POA: Diagnosis present

## 2013-05-14 DIAGNOSIS — I340 Nonrheumatic mitral (valve) insufficiency: Secondary | ICD-10-CM

## 2013-05-14 DIAGNOSIS — R7989 Other specified abnormal findings of blood chemistry: Secondary | ICD-10-CM

## 2013-05-14 DIAGNOSIS — E109 Type 1 diabetes mellitus without complications: Secondary | ICD-10-CM | POA: Diagnosis present

## 2013-05-14 DIAGNOSIS — K745 Biliary cirrhosis, unspecified: Secondary | ICD-10-CM | POA: Diagnosis present

## 2013-05-14 DIAGNOSIS — Z7902 Long term (current) use of antithrombotics/antiplatelets: Secondary | ICD-10-CM

## 2013-05-14 DIAGNOSIS — I639 Cerebral infarction, unspecified: Secondary | ICD-10-CM

## 2013-05-14 DIAGNOSIS — I63511 Cerebral infarction due to unspecified occlusion or stenosis of right middle cerebral artery: Secondary | ICD-10-CM | POA: Insufficient documentation

## 2013-05-14 DIAGNOSIS — R4182 Altered mental status, unspecified: Secondary | ICD-10-CM | POA: Diagnosis present

## 2013-05-14 DIAGNOSIS — I248 Other forms of acute ischemic heart disease: Secondary | ICD-10-CM | POA: Diagnosis present

## 2013-05-14 DIAGNOSIS — Z8249 Family history of ischemic heart disease and other diseases of the circulatory system: Secondary | ICD-10-CM

## 2013-05-14 DIAGNOSIS — I519 Heart disease, unspecified: Secondary | ICD-10-CM

## 2013-05-14 DIAGNOSIS — I10 Essential (primary) hypertension: Secondary | ICD-10-CM | POA: Diagnosis present

## 2013-05-14 DIAGNOSIS — R29810 Facial weakness: Secondary | ICD-10-CM | POA: Diagnosis present

## 2013-05-14 DIAGNOSIS — Z79899 Other long term (current) drug therapy: Secondary | ICD-10-CM

## 2013-05-14 DIAGNOSIS — E785 Hyperlipidemia, unspecified: Secondary | ICD-10-CM | POA: Diagnosis present

## 2013-05-14 DIAGNOSIS — I2489 Other forms of acute ischemic heart disease: Secondary | ICD-10-CM | POA: Diagnosis present

## 2013-05-14 DIAGNOSIS — I6350 Cerebral infarction due to unspecified occlusion or stenosis of unspecified cerebral artery: Principal | ICD-10-CM | POA: Diagnosis present

## 2013-05-14 HISTORY — DX: Cerebral infarction, unspecified: I63.9

## 2013-05-14 LAB — PROTIME-INR
INR: 1.02 (ref 0.00–1.49)
Prothrombin Time: 13.2 seconds (ref 11.6–15.2)

## 2013-05-14 LAB — CK TOTAL AND CKMB (NOT AT ARMC)
CK, MB: 2 ng/mL (ref 0.3–4.0)
Relative Index: INVALID (ref 0.0–2.5)

## 2013-05-14 LAB — COMPREHENSIVE METABOLIC PANEL
ALT: 15 U/L (ref 0–35)
AST: 30 U/L (ref 0–37)
Albumin: 3.2 g/dL — ABNORMAL LOW (ref 3.5–5.2)
CO2: 27 mEq/L (ref 19–32)
Calcium: 9.2 mg/dL (ref 8.4–10.5)
Chloride: 102 mEq/L (ref 96–112)
GFR calc non Af Amer: 78 mL/min — ABNORMAL LOW (ref >90)
Sodium: 140 mEq/L (ref 135–145)
Total Bilirubin: 0.4 mg/dL (ref 0.3–1.2)

## 2013-05-14 LAB — POCT I-STAT, CHEM 8
BUN: 15 mg/dL (ref 6–23)
Chloride: 102 mEq/L (ref 96–112)
Creatinine, Ser: 0.8 mg/dL (ref 0.50–1.10)
Sodium: 139 mEq/L (ref 135–145)
TCO2: 26 mmol/L (ref 0–100)

## 2013-05-14 LAB — POCT I-STAT TROPONIN I: Troponin i, poc: 0.46 ng/mL (ref 0.00–0.08)

## 2013-05-14 LAB — CBC
MCHC: 35 g/dL (ref 30.0–36.0)
Platelets: 274 10*3/uL (ref 150–400)
RDW: 12.8 % (ref 11.5–15.5)
WBC: 11.8 10*3/uL — ABNORMAL HIGH (ref 4.0–10.5)

## 2013-05-14 LAB — URINALYSIS, ROUTINE W REFLEX MICROSCOPIC
Glucose, UA: NEGATIVE mg/dL
Hgb urine dipstick: NEGATIVE
Leukocytes, UA: NEGATIVE
Protein, ur: NEGATIVE mg/dL
pH: 7 (ref 5.0–8.0)

## 2013-05-14 LAB — DIFFERENTIAL
Basophils Absolute: 0 10*3/uL (ref 0.0–0.1)
Basophils Relative: 0 % (ref 0–1)
Lymphocytes Relative: 14 % (ref 12–46)
Neutro Abs: 9.3 10*3/uL — ABNORMAL HIGH (ref 1.7–7.7)

## 2013-05-14 LAB — APTT: aPTT: 27 seconds (ref 24–37)

## 2013-05-14 LAB — TROPONIN I: Troponin I: 0.58 ng/mL (ref <0.30)

## 2013-05-14 MED ORDER — ATORVASTATIN CALCIUM 40 MG PO TABS
40.0000 mg | ORAL_TABLET | Freq: Every day | ORAL | Status: DC
  Administered 2013-05-14 – 2013-05-19 (×6): 40 mg via ORAL
  Filled 2013-05-14 (×6): qty 1

## 2013-05-14 MED ORDER — OXYCODONE HCL 5 MG PO TABS
5.0000 mg | ORAL_TABLET | Freq: Three times a day (TID) | ORAL | Status: DC | PRN
  Administered 2013-05-17 – 2013-05-19 (×5): 5 mg via ORAL
  Filled 2013-05-14 (×5): qty 1

## 2013-05-14 MED ORDER — DIAZEPAM 5 MG PO TABS
5.0000 mg | ORAL_TABLET | Freq: Three times a day (TID) | ORAL | Status: DC | PRN
  Administered 2013-05-18: 5 mg via ORAL
  Filled 2013-05-14: qty 1

## 2013-05-14 MED ORDER — TRAZODONE HCL 150 MG PO TABS
150.0000 mg | ORAL_TABLET | Freq: Every day | ORAL | Status: DC
  Administered 2013-05-14 – 2013-05-15 (×2): 150 mg via ORAL
  Administered 2013-05-16 – 2013-05-17 (×2): 75 mg via ORAL
  Administered 2013-05-18: 150 mg via ORAL
  Filled 2013-05-14 (×6): qty 1

## 2013-05-14 MED ORDER — PANTOPRAZOLE SODIUM 40 MG PO TBEC
40.0000 mg | DELAYED_RELEASE_TABLET | Freq: Every day | ORAL | Status: DC
  Administered 2013-05-15 – 2013-05-19 (×5): 40 mg via ORAL
  Filled 2013-05-14 (×2): qty 1

## 2013-05-14 MED ORDER — GLUCOSAMINE-CHONDROITIN 500-400 MG PO TABS: 1.0000 | ORAL_TABLET | Freq: Three times a day (TID) | ORAL | Status: DC

## 2013-05-14 MED ORDER — ASPIRIN 81 MG PO CHEW
324.0000 mg | CHEWABLE_TABLET | Freq: Once | ORAL | Status: DC
  Filled 2013-05-14: qty 4

## 2013-05-14 MED ORDER — ASPIRIN 300 MG RE SUPP
300.0000 mg | Freq: Once | RECTAL | Status: AC
  Administered 2013-05-14: 300 mg via RECTAL
  Filled 2013-05-14: qty 1

## 2013-05-14 MED ORDER — ADULT MULTIVITAMIN W/MINERALS CH
1.0000 | ORAL_TABLET | Freq: Every day | ORAL | Status: DC
  Administered 2013-05-14 – 2013-05-19 (×6): 1 via ORAL
  Filled 2013-05-14 (×6): qty 1

## 2013-05-14 MED ORDER — METOPROLOL SUCCINATE ER 50 MG PO TB24
50.0000 mg | ORAL_TABLET | Freq: Every day | ORAL | Status: DC
  Administered 2013-05-14 – 2013-05-19 (×6): 50 mg via ORAL
  Filled 2013-05-14 (×6): qty 1

## 2013-05-14 MED ORDER — HEPARIN SODIUM (PORCINE) 5000 UNIT/ML IJ SOLN
5000.0000 [IU] | Freq: Three times a day (TID) | INTRAMUSCULAR | Status: DC
  Administered 2013-05-14 – 2013-05-19 (×16): 5000 [IU] via SUBCUTANEOUS
  Filled 2013-05-14 (×18): qty 1

## 2013-05-14 MED ORDER — NITROGLYCERIN 0.4 MG SL SUBL: 0.4000 mg | SUBLINGUAL_TABLET | SUBLINGUAL | Status: DC | PRN

## 2013-05-14 MED ORDER — MORPHINE SULFATE 2 MG/ML IJ SOLN: 2.0000 mg | INTRAMUSCULAR | Status: DC | PRN

## 2013-05-14 NOTE — ED Notes (Signed)
Pt continues to have episodes of clarity with processing and answers questions appropriately, then will later have confusion and answer questions incorrectly, speech garbled at times.

## 2013-05-14 NOTE — Progress Notes (Signed)
Stroke pt ed. Video showed to pt and sister.

## 2013-05-14 NOTE — Progress Notes (Signed)
SLP Cancellation Note  Patient Details Name: Diamond Miller MRN: [id number] DOB: 04/10/1933   Cancelled swallow eval:       Reason Eval/Treat Not Completed: Patient preparing for procedure during earlier attempt to see.  If schedule permits, SLP services will attempt f/u later this afternoon.  Otherwise, swallow eval will be completed in am.     Blenda Mounts Laurice 05/14/2013, 3:43 PM

## 2013-05-14 NOTE — ED Notes (Signed)
Elevated troponin reported to dr. Denton Lank and nurse.

## 2013-05-14 NOTE — H&P (Signed)
Hospital Admission Note Date: 05/14/2013  Patient name: Diamond Miller Medical record number: [id number] Date of birth: Jul 16, 1932 Age: 79 y.o. Gender: female PCP: Lorie Phenix, MD  Medical Service: IMTS  Attending physician: Dr. Criselda Peaches  Chief Complaint: Slurry speech, hand numbness and AMS  History of Present Illness:  Patient has PMH of primary biliary cirrhosis, hypertension, questionable diabetes type 1 (not on any medications) and GERD, who presents with slurry speech, hand numbness and altered mental status.   Patient has difficulty in speaking. The history was provided by her sister. Per her sister,  patient is living alone in a senior apartment in [place]. At her baseline, she can use her walker to walk slowly at home. She talks to sister almost every day on the phone.  The patient was last known normal at 3:30 PM yesterday when her sister talked to her on the phone. At about 6:10 in the morning, patient called her sister telling that she may have a stroke. She told her sister that she feels numb in her hand (did not say which hand). Her sister noticed that patient had a slurry speech on the phone. Then, her sister went to the patient's home. Because patient's apartment was locked inside, she called 911 and found pt lying on the floor next to couch with AMS and slurry speech. She seems to be able to move all extremities. Patient was noticed to have mild left facial droop and urinary incontinence. Per EMS, patient's blood pressure was elevated at 210/160 mmHg. CT-head showed no intracranial hemorrhage, but there is a decreased attenuation in left basal ganglia measures about 1.3 cm which is suspicious for evolving infarct/ischemia per radiologist.   Of note,  patient was found to have elevated troponin of 0.78 in ED. Patient cannot tell whether she has chest pain or not. EKG has no significant change compared to previous one on 10/18/11.   ROS:  No fever, chills, headaches,  cough,  SOB,  abdominal pain, diarrhea, constipation, dysuria, urgency, frequency, hematuria. Patient has mild bilateral leg swelling.  Meds: No current outpatient prescriptions on file.  Allergies: Allergies as of 05/14/2013 - Review Complete 05/14/2013  Allergen Reaction Noted  . Aspirin Other (See Comments) 10/18/2011  . Tylenol [acetaminophen] Other (See Comments) 11/26/2012  . Penicillins Rash 10/18/2011   Past Medical History  Diagnosis Date  . Back pain Chronic back pain  . Scoliosis   . Liver cirrhosis   . Hypertension   . Acid reflux    Past Surgical History  Procedure Laterality Date  . Knee repalcement    . Replacement total knee    . Eye surgery     Family History  Problem Relation Age of Onset  . Heart disease Mother   . Hypertension Sister   . Dementia Brother 28   History   Social History  . Marital Status: Widowed    Spouse Name: N/A    Number of Children: N/A  . Years of Education: N/A   Occupational History  . Not on file.   Social History Main Topics  . Smoking status: Former Games developer  . Smokeless tobacco: Not on file  . Alcohol Use: No  . Drug Use: No  . Sexual Activity: Not on file   Other Topics Concern  . Not on file   Social History Narrative  . No narrative on file    Review of Systems: Full 14-point review of systems otherwise negative except as noted above in HPI.   Physical Exam:  Filed Vitals:   05/14/13 0915 05/14/13 0945 05/14/13 1015 05/14/13 1100  BP: 158/65 169/81 156/74 127/77  Pulse: 73 76 82 86  Temp:      TempSrc:      Resp: 13 18 15 24   SpO2: 100% 100% 100% 100%    General: Not in acute distress. Looks comfortable. Slow to respond. Flat affect.  HEENT: PERRL, EOMI, no scleral icterus, No JVD or bruit Cardiac: S1/S2, RRR, No murmurs, gallops or rubs Pulm: Good air movement bilaterally. Clear to auscultation bilaterally. No rales, wheezing, rhonchi or rubs. Abd: Soft, nondistended, nontender, no rebound pain, no  organomegaly, BS present Ext: trace leg edema bilaterally. PT/DP pulse bilaterally Musculoskeletal: No joint deformities, erythema, or stiffness, ROM full Skin: No rashes.  Neuro: Alert, not oriented X3, not follow commands consistently, Responds yes/no to some questions, has slurry speech. Cranial nerves II-XII grossly intact except left facial droop. Muscle strength 5/5 in all extremeties, sensation to light touch intact. Brachial reflex 2+ bilaterally. Knee reflex 1+ bilaterally. positive Babinski's sign bilaterally.  finger to nose test not performed.  Lab results: Basic Metabolic Panel:  Recent Labs  65/78/46 0825 05/14/13 0834  NA 140 139  K 3.6 3.5  CL 102 102  CO2 27  --   GLUCOSE 110* 113*  BUN 16 15  CREATININE 0.78 0.80  CALCIUM 9.2  --    Liver Function Tests:  Recent Labs  05/14/13 0825  AST 30  ALT 15  ALKPHOS 140*  BILITOT 0.4  PROT 6.9  ALBUMIN 3.2*   No results found for this basename: LIPASE, AMYLASE,  in the last 72 hours No results found for this basename: AMMONIA,  in the last 72 hours CBC:  Recent Labs  05/14/13 0825 05/14/13 0834  WBC 11.8*  --   NEUTROABS 9.3*  --   HGB 13.8 14.3  HCT 39.4 42.0  MCV 86.6  --   PLT 274  --    Cardiac Enzymes:  Recent Labs  05/14/13 0825  TROPONINI 0.78*   BNP: No results found for this basename: PROBNP,  in the last 72 hours D-Dimer: No results found for this basename: DDIMER,  in the last 72 hours CBG: No results found for this basename: GLUCAP,  in the last 72 hours Hemoglobin A1C: No results found for this basename: HGBA1C,  in the last 72 hours Fasting Lipid Panel: No results found for this basename: CHOL, HDL, LDLCALC, TRIG, CHOLHDL, LDLDIRECT,  in the last 72 hours Thyroid Function Tests: No results found for this basename: TSH, T4TOTAL, FREET4, T3FREE, THYROIDAB,  in the last 72 hours Anemia Panel: No results found for this basename: VITAMINB12, FOLATE, FERRITIN, TIBC, IRON, RETICCTPCT,   in the last 72 hours Coagulation:  Recent Labs  05/14/13 0825  LABPROT 13.2  INR 1.02   Urine Drug Screen: Drugs of Abuse  No results found for this basename: labopia,  cocainscrnur,  labbenz,  amphetmu,  thcu,  labbarb    Alcohol Level: No results found for this basename: ETH,  in the last 72 hours Urinalysis:  Recent Labs  05/14/13 0846  COLORURINE YELLOW  LABSPEC 1.011  PHURINE 7.0  GLUCOSEU NEGATIVE  HGBUR NEGATIVE  BILIRUBINUR NEGATIVE  KETONESUR NEGATIVE  PROTEINUR NEGATIVE  UROBILINOGEN 0.2  NITRITE NEGATIVE  LEUKOCYTESUR NEGATIVE   Misc. Labs:   Imaging results:  Ct Head Wo Contrast  05/14/2013   CLINICAL DATA:  Slurred speech, found on the floor  EXAM: CT HEAD WITHOUT CONTRAST  TECHNIQUE:  Contiguous axial images were obtained from the base of the skull through the vertex without intravenous contrast.  COMPARISON:  None.  FINDINGS: No skull fracture is noted. There is mucosal thickening with air-fluid level left sphenoid sinus. The mastoid air cells are unremarkable. No intracranial hemorrhage, mass effect or midline shift. Mild cerebral atrophy. There is probable old infarct with mild encephalomalacia in right frontal lobe. There is decreased attenuation in left basal ganglia measures about 1.3 cm. This is highly suspicious for evolving infarct/ischemia. Best seen in axial image 17. Further evaluation with MRI with diffusion imaging is recommended. No mass lesion is noted on this unenhanced scan.  IMPRESSION: No intracranial hemorrhage, mass effect or midline shift. Mild cerebral atrophy. There is probable old infarct with mild encephalomalacia in right frontal lobe. There is decreased attenuation in left basal ganglia measures about 1.3 cm. This is highly suspicious for evolving infarct/ischemia. Best seen in axial image 17. Further evaluation with MRI with diffusion imaging is recommended.  These results were called by telephone at the time of interpretation on  05/14/2013 at 9:44 AM to Dr. Cathren Laine , who verbally acknowledged these results.   Electronically Signed   By: Natasha Mead M.D.   On: 05/14/2013 09:45    Other results:  Assessment & Plan by Problem: Principal Problem:   Acute ischemic stroke Active Problems:   DIABETES MELLITUS, TYPE I   HYPERTENSION   Esophageal reflux   BILIARY CIRRHOSIS, PRIMARY   Elevated troponin I level   Altered mental status  79 year old lady with past medical history of primary biliary cirrhosis, hypertension, GERD, questionable diabetes type 1, who presents with slurry speech and hand anomalies. CT head showed possible ischemic stroke. Troponin elevated at 0.78. Not very sure whether patient has chest pain.   #: Stroke: Patient presents with acute onset of slurred speech and hand numbness. It is most likely due to ischemic stroke as evidenced on CT scan. CT head was negative for hemorrhage, CNS lesion, SDH/SAH, or other acute process. Patient has risk factors for stroke, including old age, hypertension and possible diabetes. Patient is out of window for tPA. Patient has history of primary biliary cirrhosis. She was advised not to take aspirin by her gastroenterologist, but given the significant benefit for both stroke and her elevated troponin, a dose of aspirin was given by ED physician.   - Will admit to telemetry - neurology was consulted by ED, will follow up recommendations -  MRI was ordered by ED - NPO - SLP - permissive blood pressure. - Check carotid dopplers, 2 D ehco -  Risk factor stratification: Fasting lipid panel, TSH and  HbA1c - Cycle cardiac enzymes and repeat 12 lead EKG in the morning - PT/OT consult  #: Elevated troponin: trop 0.78 on admission. Patient did not have history of MI. Not very sure whether patient has chest pain currently.  EKG has no significant change compared to previous one on 10/18/11. She is on metoprolol 50 mg daily at home which is likely for HTN.   - Cardiology  was consulted, we'll followup the recommendation - Nitroglycerin and Morphine prn - start lipitor and Metoprolol after passing SLP - Risk factor stratification as above - trop q6h - repeat EKG in AM  #: AMS: likely secondary to acute stroke and possible NSTEMI. Patient does not have signs of infection. Urinalysis is negative for UTI. Patient does not have shortness of breath, oxygen saturation at 100% on room air. Electrolytes are basically normal. Patient  has history of biliary cirrhosis, but liver function was okay, unlikely to have hepatic encephalopathy.  -will observe closely. -manage stroke and elevated trop as above.   # GERD: on Nexium at home, - will switch to  Protonix in hospital.    #: Questionable diabetes mellitus: Patient carries a diagnosis of DM-I. Not on any medications. CBG is 110 on BMP today. No A1c on record.  -will check A1c  #: HTN:  Bp is 158/65 on admission. On metoprolol at home. -Permissive blood pressure -We'll continue metoprolol for possible NSTEMI  #: Primary biliary cirrhosis:  December, 2011 liver biopsy was highly suggestive of PBC at early-stage. Patient was followed up by Dr. Christella Hartigan. Last seen was on 06/01/10. Per Dr. Collier Bullock note, patient had negative anti-smooth muscle antibody and ANA homogenous + 1:320 titer. Patient was treated with Trial of Urso forte which resulted in dramatic diarrhea. Currently she is not taking any medications. Her liver function was okay with AST 30, ALT 15, bilirubin 0.4, albumin 3.2, protein 6.9, and ALP 140.   -may need to refer back to Dr. Christella Hartigan at d/c  #  F/E/N  -NS: SL -f/u electrolytes by BMP -NPO until SLP done  # DVT px: Heparin sq  Dispo: Disposition is deferred at this time, awaiting improvement of current medical problems. Anticipated discharge in approximately 2 to 4 day(s).   The patient does not have a current PCP (MALONEY,NANCY, MD), therefore is not requiring OPC follow-up after discharge.   The  patient does not have transportation limitations that hinder transportation to clinic appointments.   Signed:  Lorretta Harp, MD PGY3, Internal Medicine Teaching Service Pager: [phone]  05/14/2013, 1:11 PM

## 2013-05-14 NOTE — Consult Note (Signed)
Reason for Consult: abnormal troponin  Referring Physician:  Dr. Lorretta Harp  PCP: Lorie Phenix, MD  Primary Cardiologist: Danton Sewer Gotschall is an 79 y.o. female.    Chief Complaint:  Admitted 05/14/13 with slurry speech  HPI: 79 year old with PMH of primary biliary cirrhosis, hypertension, no history of diabetes, + GERD, who presents with a slurry speech and altered mental status.   Patient has difficulty in speaking. The history was provided by her sister. Per her sister, patient is living alone in senior apartment in [place]. At her baseline, she can use her walker to walk slowly at home. She talks to sister almost every day on the phone.  The patient was last known normal at 3:30 PM yesterday when her sister talked to her on the phone. At about 6:10 in the morning, patient called her sister telling that she may have a stroke. She told her sister that she feels numb in her hand (did not say which hand). Her sister noticed that patient had a slurry speech on the phone. Then, her sister went to the patient's home. Because patient's apartment was locked inside, she called 911 and found pt lying on the floor next to couch with AMS and slurry speech. She seems to be able to move all extremities. Patient was noticed to have mild left facial droop and urinary incontinence. Per EMS, patient's blood pressure was elevated at 210/160 mmHg. CT-head showed no intracranial hemorrhage, but there is a decreased attenuation in left basal ganglia measures about 1.3 cm which is suspicious for evolving infarct/ischemia per radiologist.   Of note, patient was found to have elevated troponin of 0.78 in ED. Patient cannot tell whether she has chest pain or not. EKG has no significant change compared to previous one on 10/18/11.   Currently speech is improved, she denies chest pain, no SOB and none before admit.  No Hx of CAD.  No hx arrhthymias.   Past Medical History  Diagnosis Date  .  Back pain Chronic back pain  . Scoliosis   . Liver cirrhosis   . Hypertension   . Acid reflux     Past Surgical History  Procedure Laterality Date  . Knee repalcement    . Replacement total knee    . Eye surgery      Family History  Problem Relation Age of Onset  . Heart disease Mother   . Hypertension Sister   . Dementia Brother 17   Social History:  reports that she has quit smoking. She does not have any smokeless tobacco history on file. She reports that she does not drink alcohol or use illicit drugs.  Allergies:  Allergies  Allergen Reactions  . Aspirin Other (See Comments)    Liver function  . Tylenol [Acetaminophen] Other (See Comments)    Liver function   . Penicillins Rash    Medications Prior to Admission  Medication Sig Dispense Refill  . esomeprazole (NEXIUM) 40 MG capsule Take 40 mg by mouth 2 (two) times daily before a meal.       . metoprolol succinate (TOPROL-XL) 50 MG 24 hr tablet Take 50 mg by mouth daily. Take with or immediately following a meal.      . oxyCODONE (OXY IR/ROXICODONE) 5 MG immediate release tablet Take 5 mg by mouth 3 (three) times daily as needed for moderate pain. For pain      . traZODone (DESYREL) 150 MG tablet Take 150  mg by mouth at bedtime.        . diazepam (VALIUM) 5 MG tablet Take 5 mg by mouth every 8 (eight) hours as needed for anxiety.       Marland Kitchen glucosamine-chondroitin 500-400 MG tablet Take 1 tablet by mouth 3 (three) times daily.       . magnesium oxide (MAG-OX) 400 MG tablet Take 400 mg by mouth daily.        Marland Kitchen moxifloxacin (VIGAMOX) 0.5 % ophthalmic solution 1 drop 3 (three) times daily.      . Multiple Vitamin (MULITIVITAMIN WITH MINERALS) TABS Take 1 tablet by mouth daily.        Results for orders placed during the hospital encounter of 05/14/13 (from the past 48 hour(s))  PROTIME-INR     Status: None   Collection Time    05/14/13  8:25 AM      Result Value Range   Prothrombin Time 13.2  11.6 - 15.2 seconds    INR 1.02  0.00 - 1.49  APTT     Status: None   Collection Time    05/14/13  8:25 AM      Result Value Range   aPTT 27  24 - 37 seconds  CBC     Status: Abnormal   Collection Time    05/14/13  8:25 AM      Result Value Range   WBC 11.8 (*) 4.0 - 10.5 K/uL   RBC 4.55  3.87 - 5.11 MIL/uL   Hemoglobin 13.8  12.0 - 15.0 g/dL   HCT 16.1  09.6 - 04.5 %   MCV 86.6  78.0 - 100.0 fL   MCH 30.3  26.0 - 34.0 pg   MCHC 35.0  30.0 - 36.0 g/dL   RDW 40.9  81.1 - 91.4 %   Platelets 274  150 - 400 K/uL  DIFFERENTIAL     Status: Abnormal   Collection Time    05/14/13  8:25 AM      Result Value Range   Neutrophils Relative % 79 (*) 43 - 77 %   Neutro Abs 9.3 (*) 1.7 - 7.7 K/uL   Lymphocytes Relative 14  12 - 46 %   Lymphs Abs 1.6  0.7 - 4.0 K/uL   Monocytes Relative 6  3 - 12 %   Monocytes Absolute 0.7  0.1 - 1.0 K/uL   Eosinophils Relative 1  0 - 5 %   Eosinophils Absolute 0.1  0.0 - 0.7 K/uL   Basophils Relative 0  0 - 1 %   Basophils Absolute 0.0  0.0 - 0.1 K/uL  COMPREHENSIVE METABOLIC PANEL     Status: Abnormal   Collection Time    05/14/13  8:25 AM      Result Value Range   Sodium 140  135 - 145 mEq/L   Potassium 3.6  3.5 - 5.1 mEq/L   Chloride 102  96 - 112 mEq/L   CO2 27  19 - 32 mEq/L   Glucose, Bld 110 (*) 70 - 99 mg/dL   BUN 16  6 - 23 mg/dL   Creatinine, Ser 7.82  0.50 - 1.10 mg/dL   Calcium 9.2  8.4 - 95.6 mg/dL   Total Protein 6.9  6.0 - 8.3 g/dL   Albumin 3.2 (*) 3.5 - 5.2 g/dL   AST 30  0 - 37 U/L   ALT 15  0 - 35 U/L   Alkaline Phosphatase 140 (*) 39 - 117 U/L  Total Bilirubin 0.4  0.3 - 1.2 mg/dL   GFR calc non Af Amer 78 (*) >90 mL/min   GFR calc Af Amer >90  >90 mL/min   Comment: (NOTE)     The eGFR has been calculated using the CKD EPI equation.     This calculation has not been validated in all clinical situations.     eGFR's persistently <90 mL/min signify possible Chronic Kidney     Disease.  TROPONIN I     Status: Abnormal   Collection Time     05/14/13  8:25 AM      Result Value Range   Troponin I 0.78 (*) <0.30 ng/mL   Comment:            Due to the release kinetics of cTnI,     a negative result within the first hours     of the onset of symptoms does not rule out     myocardial infarction with certainty.     If myocardial infarction is still suspected,     repeat the test at appropriate intervals.     CRITICAL RESULT CALLED TO, READ BACK BY AND VERIFIED WITH:     E BIVENS,RN AT 1610 05/14/13 BY K BARR  POCT I-STAT TROPONIN I     Status: Abnormal   Collection Time    05/14/13  8:33 AM      Result Value Range   Troponin i, poc 0.46 (*) 0.00 - 0.08 ng/mL   Comment NOTIFIED PHYSICIAN     Comment 3            Comment: Due to the release kinetics of cTnI,     a negative result within the first hours     of the onset of symptoms does not rule out     myocardial infarction with certainty.     If myocardial infarction is still suspected,     repeat the test at appropriate intervals.  POCT I-STAT, CHEM 8     Status: Abnormal   Collection Time    05/14/13  8:34 AM      Result Value Range   Sodium 139  135 - 145 mEq/L   Potassium 3.5  3.5 - 5.1 mEq/L   Chloride 102  96 - 112 mEq/L   BUN 15  6 - 23 mg/dL   Creatinine, Ser 9.60  0.50 - 1.10 mg/dL   Glucose, Bld 454 (*) 70 - 99 mg/dL   Calcium, Ion 0.98  1.19 - 1.30 mmol/L   TCO2 26  0 - 100 mmol/L   Hemoglobin 14.3  12.0 - 15.0 g/dL   HCT 14.7  82.9 - 56.2 %  URINALYSIS, ROUTINE W REFLEX MICROSCOPIC     Status: None   Collection Time    05/14/13  8:46 AM      Result Value Range   Color, Urine YELLOW  YELLOW   APPearance CLEAR  CLEAR   Specific Gravity, Urine 1.011  1.005 - 1.030   pH 7.0  5.0 - 8.0   Glucose, UA NEGATIVE  NEGATIVE mg/dL   Hgb urine dipstick NEGATIVE  NEGATIVE   Bilirubin Urine NEGATIVE  NEGATIVE   Ketones, ur NEGATIVE  NEGATIVE mg/dL   Protein, ur NEGATIVE  NEGATIVE mg/dL   Urobilinogen, UA 0.2  0.0 - 1.0 mg/dL   Nitrite NEGATIVE  NEGATIVE    Leukocytes, UA NEGATIVE  NEGATIVE   Comment: MICROSCOPIC NOT DONE ON URINES WITH NEGATIVE PROTEIN, BLOOD, LEUKOCYTES, NITRITE, OR  GLUCOSE <1000 mg/dL.   Ct Head Wo Contrast  05/14/2013   CLINICAL DATA:  Slurred speech, found on the floor  EXAM: CT HEAD WITHOUT CONTRAST  TECHNIQUE: Contiguous axial images were obtained from the base of the skull through the vertex without intravenous contrast.  COMPARISON:  None.  FINDINGS: No skull fracture is noted. There is mucosal thickening with air-fluid level left sphenoid sinus. The mastoid air cells are unremarkable. No intracranial hemorrhage, mass effect or midline shift. Mild cerebral atrophy. There is probable old infarct with mild encephalomalacia in right frontal lobe. There is decreased attenuation in left basal ganglia measures about 1.3 cm. This is highly suspicious for evolving infarct/ischemia. Best seen in axial image 17. Further evaluation with MRI with diffusion imaging is recommended. No mass lesion is noted on this unenhanced scan.  IMPRESSION: No intracranial hemorrhage, mass effect or midline shift. Mild cerebral atrophy. There is probable old infarct with mild encephalomalacia in right frontal lobe. There is decreased attenuation in left basal ganglia measures about 1.3 cm. This is highly suspicious for evolving infarct/ischemia. Best seen in axial image 17. Further evaluation with MRI with diffusion imaging is recommended.  These results were called by telephone at the time of interpretation on 05/14/2013 at 9:44 AM to Dr. Cathren Laine , who verbally acknowledged these results.   Electronically Signed   By: Natasha Mead M.D.   On: 05/14/2013 09:45    ROS: General:no colds or fevers, no weight changes Skin:no rashes or ulcers HEENT:no blurred vision, no congestion CV:see HPI PUL:see HPI GI:no diarrhea constipation or melena, no indigestion GU:no hematuria, no dysuria MS:no joint pain, no claudication Neuro:no syncope, no  lightheadedness Endo:no diabetes, no thyroid disease   Blood pressure 127/77, pulse 86, temperature 98.3 F (36.8 C), temperature source Oral, resp. rate 24, SpO2 100.00%. PE: General:Pleasant affect, can speak without slurring her words.  But has difficulty putting her thoughts together to speak.  Skin:Warm and dry, brisk capillary refill HEENT:normocephalic, sclera clear, mucus membranes moist Neck:supple, no JVD, no bruits  Heart:S1S2 RRR without murmur, gallup, rub or click Lungs:clear though diminished, without rales, rhonchi, or wheezes ZOX:WRUE, non tender, + BS, do not palpate liver spleen or masses Ext:no lower ext edema, 2+ pedal pulses, 2+ radial pulses Neuro:alert and oriented X 3, MAE, follows commands, lt facial droop, tongue drifts to rt. Smile and frown are equal.  Moves extremities equally.  Cannot pull correct words all the time for sentence.  Can say her sister's name now and could not in the ER.    Assessment/Plan Principal Problem:   Acute ischemic stroke Active Problems:   DIABETES MELLITUS, TYPE I   HYPERTENSION   Esophageal reflux   BILIARY CIRRHOSIS, PRIMARY   Elevated troponin I level   Stroke  PLAN: will check CKMB pt was in the floor not sure how long.  Most likely demand ischemia.   Check carotid dopplers and echo.  No known arrhthymias.  MRI pending. Check lipids.  Will need a lexiscan myoview once she is stable from CVA.  Or if abnormal echo may need cath.    [facility] R  Nurse Practitioner Certified [facility] Pager [phone] or after 5pm or weekends call [phone] 05/14/2013, 1:10 PM  I have seen & examined the patient this PM after Ms. Annie Paras, NP.  I agree with her findings, exam & recommendations.  No known CAD but with CAD RFs p/w SSx c/w CVA/TIA.  We were consulted due to elevated Troponin levels.  She denied any CP, ECG without abnormalities & Echo without WMA would argue against ACS related troponin elevation.   Would suggest that the release of Troponin is related to her presenting neuralgic event.  Monitor trend.  Would not recommend anticoagulation with current lack of corroborating evidence for ACS.    Agree with ASA,PLavix & statin.   Would plan OP Myoview once stable 3-4 weeks post CVA to assess for presence of existing CAD.  Marykay Lex, MD

## 2013-05-14 NOTE — Consult Note (Signed)
Referring Physician: Criselda Peaches    Chief Complaint: Numbness and difficulty with speech  HPI: Diamond Miller is an 79 y.o. female patient who lives alone in a senior apartment in [place]. At her baseline, she can use her walker to walk slowly at home. She talks to sister almost every day on the phone.  The patient was last known normal at 3:30 PM yesterday when her sister talked to her on the phone. At about 6:10 in the morning, patient called her sister telling that she may have had a stroke. She told her sister that she felt numb in her hand (did not say which hand). Speech was fairly reasonable at that time. Then, her sister went to the patient's home. Because patient's apartment was locked inside, she called 911 and found pt lying on the floor next to couch with AMS and slurred speech. Blood pressure was elevated at 210/160 mmHg.    Date last known well: Date: 05/13/2013 Time last known well: Time: 15:30 tPA Given: No: Outside time window  Past Medical History  Diagnosis Date  . Back pain Chronic back pain  . Scoliosis   . Liver cirrhosis   . Hypertension   . Acid reflux     Past Surgical History  Procedure Laterality Date  . Knee repalcement    . Replacement total knee    . Eye surgery      Family History  Problem Relation Age of Onset  . Heart disease Mother   . Hypertension Sister   . Dementia Brother 56   Social History:  reports that she has quit smoking. She does not have any smokeless tobacco history on file. She reports that she does not drink alcohol or use illicit drugs.  Allergies:  Allergies  Allergen Reactions  . Aspirin Other (See Comments)    Liver function  . Tylenol [Acetaminophen] Other (See Comments)    Liver function   . Penicillins Rash    Medications:  I have reviewed the patient's current medications. Prior to Admission:  Prescriptions prior to admission  Medication Sig Dispense Refill  . esomeprazole (NEXIUM) 40 MG capsule Take 40 mg  by mouth 2 (two) times daily before a meal.       . metoprolol succinate (TOPROL-XL) 50 MG 24 hr tablet Take 50 mg by mouth daily. Take with or immediately following a meal.      . oxyCODONE (OXY IR/ROXICODONE) 5 MG immediate release tablet Take 5 mg by mouth 3 (three) times daily as needed for moderate pain. For pain      . traZODone (DESYREL) 150 MG tablet Take 150 mg by mouth at bedtime.        . diazepam (VALIUM) 5 MG tablet Take 5 mg by mouth every 8 (eight) hours as needed for anxiety.       Marland Kitchen glucosamine-chondroitin 500-400 MG tablet Take 1 tablet by mouth 3 (three) times daily.       . magnesium oxide (MAG-OX) 400 MG tablet Take 400 mg by mouth daily.        Marland Kitchen moxifloxacin (VIGAMOX) 0.5 % ophthalmic solution 1 drop 3 (three) times daily.      . Multiple Vitamin (MULITIVITAMIN WITH MINERALS) TABS Take 1 tablet by mouth daily.       Scheduled: . atorvastatin  40 mg Oral q1800  . heparin  5,000 Units Subcutaneous Q8H  . metoprolol succinate  50 mg Oral Daily  . multivitamin with minerals  1 tablet Oral Daily  .  pantoprazole  40 mg Oral Daily  . traZODone  150 mg Oral QHS    ROS: Unable to obtain  Physical Examination: Blood pressure 138/66, pulse 83, temperature 98.6 F (37 C), temperature source Oral, resp. rate 24, height 5' (1.524 m), weight 73.2 kg (161 lb 6 oz), SpO2 100.00%.  Neurologic Examination: Mental Status: Alert.  Speech fluent without evidence of aphasia.  Responds to questioning not always accurately and at times does not answer the question asked.  Able to follow 3 step commands but requires significant reinforcement. Cranial Nerves: II: Discs flat bilaterally; Visual fields grossly normal, pupils equal, round, reactive to light and accommodation III,IV, VI: ptosis not present, extra-ocular motions intact bilaterally V,VII: mild right facial droop, facial light touch sensation normal bilaterally VIII: hearing normal bilaterally IX,X: gag reflex present XI:  bilateral shoulder shrug XII: midline tongue extension Motor: Patient does not cooperate for formal testing but lifts the right side less than the left.  Able to lift all extremities against gravity. Sensory: Does not give reliable answers to sensory testing  Deep Tendon Reflexes: 2+ and symmetric throughout Plantars: Right: upgoing   Left: upgoing Cerebellar: Normal finger-to-nose testing bilaterally.  Heel-to-shin testing unable to be performed on the the right lower extremity.  Intact in the left lower extremity. Gait: unable to test CV: pulses palpable throughout   Laboratory Studies:  Basic Metabolic Panel:  Recent Labs Lab 05/14/13 0825 05/14/13 0834  NA 140 139  K 3.6 3.5  CL 102 102  CO2 27  --   GLUCOSE 110* 113*  BUN 16 15  CREATININE 0.78 0.80  CALCIUM 9.2  --     Liver Function Tests:  Recent Labs Lab 05/14/13 0825  AST 30  ALT 15  ALKPHOS 140*  BILITOT 0.4  PROT 6.9  ALBUMIN 3.2*   No results found for this basename: LIPASE, AMYLASE,  in the last 168 hours No results found for this basename: AMMONIA,  in the last 168 hours  CBC:  Recent Labs Lab 05/14/13 0825 05/14/13 0834  WBC 11.8*  --   NEUTROABS 9.3*  --   HGB 13.8 14.3  HCT 39.4 42.0  MCV 86.6  --   PLT 274  --     Cardiac Enzymes:  Recent Labs Lab 05/14/13 0825  TROPONINI 0.78*    BNP: No components found with this basename: POCBNP,   CBG: No results found for this basename: GLUCAP,  in the last 168 hours  Microbiology: No results found for this or any previous visit.  Coagulation Studies:  Recent Labs  05/14/13 0825  LABPROT 13.2  INR 1.02    Urinalysis:  Recent Labs Lab 05/14/13 0846  COLORURINE YELLOW  LABSPEC 1.011  PHURINE 7.0  GLUCOSEU NEGATIVE  HGBUR NEGATIVE  BILIRUBINUR NEGATIVE  KETONESUR NEGATIVE  PROTEINUR NEGATIVE  UROBILINOGEN 0.2  NITRITE NEGATIVE  LEUKOCYTESUR NEGATIVE    Lipid Panel: No results found for this basename: chol,  trig, hdl, cholhdl, vldl, ldlcalc    HgbA1C:  No results found for this basename: HGBA1C    Urine Drug Screen:   No results found for this basename: labopia, cocainscrnur, labbenz, amphetmu, thcu, labbarb    Alcohol Level: No results found for this basename: ETH,  in the last 168 hours  Other results: EKG: sinus rhythm at 79 bpm.  Imaging: Ct Head Wo Contrast  05/14/2013   CLINICAL DATA:  Slurred speech, found on the floor  EXAM: CT HEAD WITHOUT CONTRAST  TECHNIQUE: Contiguous axial images  were obtained from the base of the skull through the vertex without intravenous contrast.  COMPARISON:  None.  FINDINGS: No skull fracture is noted. There is mucosal thickening with air-fluid level left sphenoid sinus. The mastoid air cells are unremarkable. No intracranial hemorrhage, mass effect or midline shift. Mild cerebral atrophy. There is probable old infarct with mild encephalomalacia in right frontal lobe. There is decreased attenuation in left basal ganglia measures about 1.3 cm. This is highly suspicious for evolving infarct/ischemia. Best seen in axial image 17. Further evaluation with MRI with diffusion imaging is recommended. No mass lesion is noted on this unenhanced scan.  IMPRESSION: No intracranial hemorrhage, mass effect or midline shift. Mild cerebral atrophy. There is probable old infarct with mild encephalomalacia in right frontal lobe. There is decreased attenuation in left basal ganglia measures about 1.3 cm. This is highly suspicious for evolving infarct/ischemia. Best seen in axial image 17. Further evaluation with MRI with diffusion imaging is recommended.  These results were called by telephone at the time of interpretation on 05/14/2013 at 9:44 AM to Dr. Cathren Laine , who verbally acknowledged these results.   Electronically Signed   By: Natasha Mead M.D.   On: 05/14/2013 09:45    Assessment: 79 y.o. female presenting with difficulty with speech and right sided weakness.  Head CT  reviewed and shows an area of ill-defined hypodensity in the left basal ganglia.  This may very well represent an acute infarct.  Further work up recommended.  Stroke Risk Factors - hypertension  Plan: 1. HgbA1c, fasting lipid panel 2. MRI, MRA  of the brain without contrast 3. PT consult, OT consult, Speech consult 4. Echocardiogram 5. Carotid dopplers 6. Prophylactic therapy-Antiplatelet med: Plavix - dose 75mg  daily 7. Risk factor modification 8. Telemetry monitoring 9. Frequent neuro checks    Thana Farr, MD [facility] [phone] 05/14/2013, 2:29 PM

## 2013-05-14 NOTE — Progress Notes (Signed)
VASCULAR LAB PRELIMINARY  PRELIMINARY  PRELIMINARY  PRELIMINARY  Carotid duplex completed.    Preliminary report:  Bilateral:  1-39% ICA stenosis.  Vertebral artery flow is antegrade.     Jamesetta Greenhalgh, RVS 05/14/2013, 3:03 PM

## 2013-05-14 NOTE — ED Notes (Signed)
Pt had small incontinence bm in the bed. Pt moved to bsc, formed, medium bm.

## 2013-05-14 NOTE — ED Notes (Signed)
Patient transported to CT 

## 2013-05-14 NOTE — ED Provider Notes (Addendum)
CSN: [id number]     Arrival date & time 05/14/13  0750 History   First MD Initiated Contact with Patient 05/14/13 0805     Chief Complaint  Patient presents with  . Stroke Symptoms   (Consider location/radiation/quality/duration/timing/severity/associated sxs/prior Treatment) The history is provided by the patient and a relative. The history is limited by the condition of the patient.  pt with hx htn, was found by sister on floor beside couch this morning, w decreased responsiveness and slurred speech. Pt v poor/difficult historian - level 5 caveat re altered mental status.  pts sister states talked to her on phone yesterday afternoon at 3 pm and seemed her normal self, no issues or complaints then.  Pt then called her at 6am today, stating left side body numb and feeling she may have had stroke. pts sister noted her speech was altered, slow, slurred.  She went to her home, could not get in door, called 911, found pt lying on floor next to couch.  Pt states she slept on couch last night, cant say how she got on floor. Pt was conscious and alert when she found her but speech altered. Pt denies pain. Unsure if loc. Denies numbness/weakness. No headache.     Past Medical History  Diagnosis Date  . Back pain Chronic back pain  . Scoliosis   . Liver cirrhosis   . Hypertension   . Acid reflux    Past Surgical History  Procedure Laterality Date  . Knee repalcement    . Replacement total knee     History reviewed. No pertinent family history. History  Substance Use Topics  . Smoking status: Former Games developer  . Smokeless tobacco: Not on file  . Alcohol Use: No   OB History   Grav Para Term Preterm Abortions TAB SAB Ect Mult Living                 Review of Systems  Constitutional: Negative for fever and chills.  HENT: Negative for sore throat.   Eyes: Negative for visual disturbance.  Respiratory: Negative for shortness of breath.   Cardiovascular: Negative for chest pain.   Gastrointestinal: Negative for vomiting, abdominal pain and diarrhea.  Genitourinary: Negative for dysuria and flank pain.  Musculoskeletal: Negative for back pain and neck pain.  Skin: Negative for rash.  Neurological: Positive for speech difficulty. Negative for weakness, numbness and headaches.  Hematological: Does not bruise/bleed easily.  Psychiatric/Behavioral: Positive for confusion.    Allergies  Aspirin; Tylenol; and Penicillins  Home Medications   Current Outpatient Rx  Name  Route  Sig  Dispense  Refill  . cefadroxil (DURICEF) 500 MG capsule   Oral   Take 500 mg by mouth 2 (two) times daily. Started 11/22/12, for 10 days, ending 12/01/12.         . clindamycin (CLEOCIN) 300 MG capsule   Oral   Take 1 capsule (300 mg total) by mouth 4 (four) times daily. X 7 days   28 capsule   0   . diazepam (VALIUM) 5 MG tablet   Oral   Take 5 mg by mouth every 8 (eight) hours as needed for anxiety.          Marland Kitchen esomeprazole (NEXIUM) 40 MG capsule   Oral   Take 40 mg by mouth daily before breakfast.          . glucosamine-chondroitin 500-400 MG tablet   Oral   Take 1 tablet by mouth 3 (three) times daily.          Marland Kitchen  magnesium oxide (MAG-OX) 400 MG tablet   Oral   Take 400 mg by mouth daily.           . metoprolol succinate (TOPROL-XL) 50 MG 24 hr tablet   Oral   Take 50 mg by mouth daily. Take with or immediately following a meal.         . moxifloxacin (VIGAMOX) 0.5 % ophthalmic solution      1 drop 3 (three) times daily.         . Multiple Vitamin (MULITIVITAMIN WITH MINERALS) TABS   Oral   Take 1 tablet by mouth daily.         Marland Kitchen oxyCODONE (OXY IR/ROXICODONE) 5 MG immediate release tablet   Oral   Take 5 mg by mouth every 4 (four) hours as needed. For pain         . traZODone (DESYREL) 150 MG tablet   Oral   Take 150 mg by mouth at bedtime.            BP 151/71  Pulse 80  Temp(Src) 98.3 F (36.8 C) (Oral)  Resp 18  SpO2 97% Physical  Exam  Nursing note and vitals reviewed. Constitutional: She appears well-developed and well-nourished. No distress.  HENT:  Head: Atraumatic.  Mouth/Throat: Oropharynx is clear and moist.  Eyes: Conjunctivae and EOM are normal. Pupils are equal, round, and reactive to light. No scleral icterus.  Neck: Neck supple. No tracheal deviation present.  No bruit  Cardiovascular: Normal rate, regular rhythm, normal heart sounds and intact distal pulses.   Pulmonary/Chest: Effort normal and breath sounds normal. No respiratory distress.  Abdominal: Soft. Normal appearance and bowel sounds are normal. She exhibits no distension. There is no tenderness.  Genitourinary:  No cva tenderness  Musculoskeletal: Normal range of motion. She exhibits no edema and no tenderness.  Good rom bil ext without pain or focal bony tenderness. CTLS spine, non tender, aligned, no step off.   Neurological: She is alert.  Awake and alert. Responds yes/no or w brief responses to questions. Speech is slow, although not slurred. Makes purposeful movements bil extremities, but is slow to follow some commands. ?mild left facial droop.   No pronator drift. Equal grip. Motor 5/5 bil.   Skin: Skin is warm and dry. No rash noted. She is not diaphoretic.  Psychiatric:  Slow to respond. Flat affect.     ED Course  Procedures (including critical care time)  Results for orders placed during the hospital encounter of 05/14/13  PROTIME-INR      Result Value Range   Prothrombin Time 13.2  11.6 - 15.2 seconds   INR 1.02  0.00 - 1.49  APTT      Result Value Range   aPTT 27  24 - 37 seconds  CBC      Result Value Range   WBC 11.8 (*) 4.0 - 10.5 K/uL   RBC 4.55  3.87 - 5.11 MIL/uL   Hemoglobin 13.8  12.0 - 15.0 g/dL   HCT 54.0  98.1 - 19.1 %   MCV 86.6  78.0 - 100.0 fL   MCH 30.3  26.0 - 34.0 pg   MCHC 35.0  30.0 - 36.0 g/dL   RDW 47.8  29.5 - 62.1 %   Platelets 274  150 - 400 K/uL  DIFFERENTIAL      Result Value Range    Neutrophils Relative % 79 (*) 43 - 77 %   Neutro Abs 9.3 (*) 1.7 - 7.7  K/uL   Lymphocytes Relative 14  12 - 46 %   Lymphs Abs 1.6  0.7 - 4.0 K/uL   Monocytes Relative 6  3 - 12 %   Monocytes Absolute 0.7  0.1 - 1.0 K/uL   Eosinophils Relative 1  0 - 5 %   Eosinophils Absolute 0.1  0.0 - 0.7 K/uL   Basophils Relative 0  0 - 1 %   Basophils Absolute 0.0  0.0 - 0.1 K/uL  COMPREHENSIVE METABOLIC PANEL      Result Value Range   Sodium 140  135 - 145 mEq/L   Potassium 3.6  3.5 - 5.1 mEq/L   Chloride 102  96 - 112 mEq/L   CO2 27  19 - 32 mEq/L   Glucose, Bld 110 (*) 70 - 99 mg/dL   BUN 16  6 - 23 mg/dL   Creatinine, Ser 1.61  0.50 - 1.10 mg/dL   Calcium 9.2  8.4 - 09.6 mg/dL   Total Protein 6.9  6.0 - 8.3 g/dL   Albumin 3.2 (*) 3.5 - 5.2 g/dL   AST 30  0 - 37 U/L   ALT 15  0 - 35 U/L   Alkaline Phosphatase 140 (*) 39 - 117 U/L   Total Bilirubin 0.4  0.3 - 1.2 mg/dL   GFR calc non Af Amer 78 (*) >90 mL/min   GFR calc Af Amer >90  >90 mL/min  TROPONIN I      Result Value Range   Troponin I 0.78 (*) <0.30 ng/mL  URINALYSIS, ROUTINE W REFLEX MICROSCOPIC      Result Value Range   Color, Urine YELLOW  YELLOW   APPearance CLEAR  CLEAR   Specific Gravity, Urine 1.011  1.005 - 1.030   pH 7.0  5.0 - 8.0   Glucose, UA NEGATIVE  NEGATIVE mg/dL   Hgb urine dipstick NEGATIVE  NEGATIVE   Bilirubin Urine NEGATIVE  NEGATIVE   Ketones, ur NEGATIVE  NEGATIVE mg/dL   Protein, ur NEGATIVE  NEGATIVE mg/dL   Urobilinogen, UA 0.2  0.0 - 1.0 mg/dL   Nitrite NEGATIVE  NEGATIVE   Leukocytes, UA NEGATIVE  NEGATIVE  POCT I-STAT, CHEM 8      Result Value Range   Sodium 139  135 - 145 mEq/L   Potassium 3.5  3.5 - 5.1 mEq/L   Chloride 102  96 - 112 mEq/L   BUN 15  6 - 23 mg/dL   Creatinine, Ser 0.45  0.50 - 1.10 mg/dL   Glucose, Bld 409 (*) 70 - 99 mg/dL   Calcium, Ion 8.11  9.14 - 1.30 mmol/L   TCO2 26  0 - 100 mmol/L   Hemoglobin 14.3  12.0 - 15.0 g/dL   HCT 78.2  95.6 - 21.3 %  POCT I-STAT  TROPONIN I      Result Value Range   Troponin i, poc 0.46 (*) 0.00 - 0.08 ng/mL   Comment NOTIFIED PHYSICIAN     Comment 3            Ct Head Wo Contrast  05/14/2013   CLINICAL DATA:  Slurred speech, found on the floor  EXAM: CT HEAD WITHOUT CONTRAST  TECHNIQUE: Contiguous axial images were obtained from the base of the skull through the vertex without intravenous contrast.  COMPARISON:  None.  FINDINGS: No skull fracture is noted. There is mucosal thickening with air-fluid level left sphenoid sinus. The mastoid air cells are unremarkable. No intracranial hemorrhage, mass effect or midline  shift. Mild cerebral atrophy. There is probable old infarct with mild encephalomalacia in right frontal lobe. There is decreased attenuation in left basal ganglia measures about 1.3 cm. This is highly suspicious for evolving infarct/ischemia. Best seen in axial image 17. Further evaluation with MRI with diffusion imaging is recommended. No mass lesion is noted on this unenhanced scan.  IMPRESSION: No intracranial hemorrhage, mass effect or midline shift. Mild cerebral atrophy. There is probable old infarct with mild encephalomalacia in right frontal lobe. There is decreased attenuation in left basal ganglia measures about 1.3 cm. This is highly suspicious for evolving infarct/ischemia. Best seen in axial image 17. Further evaluation with MRI with diffusion imaging is recommended.  These results were called by telephone at the time of interpretation on 05/14/2013 at 9:44 AM to Dr. Cathren Laine , who verbally acknowledged these results.   Electronically Signed   By: Natasha Mead M.D.   On: 05/14/2013 09:45      EKG Interpretation    Date/Time:  Tuesday May 14 2013 07:55:23 EST Ventricular Rate:  79 PR Interval:  177 QRS Duration: 83 QT Interval:  385 QTC Calculation: 441 R Axis:   -27 Text Interpretation:  Sinus rhythm Borderline left axis deviation No significant change since last tracing Confirmed by  Reilley Valentine  MD, Jaidin Ugarte ([id number]) on 05/14/2013 8:20:21 AM            MDM  Iv ns. Continuous pulse ox and monitor. Labs. Ct.  Reviewed nursing notes and prior charts for additional history.   Radiologist called re concern thrombotic cva on ct, no hem.  Discussed w pt/family.  Pt states was told not to take asa as hx liver problems, denies other allergic rxn to asa. Given concern cva (and elev trop) - will give asa.  elev trop noted - ecg without acute st/t changes. Pt denies any current or recent cp or discomfort. No sob. ?clinical significance.   Med service called to admit.   Discussed w admitting resident.  I also will consult neurology, Dr Thad Ranger. Admitting team is calling the cardiology consult re elevated trop.   Recheck pt. No change from prior exam.   Suzi Roots, MD 05/14/13 1036

## 2013-05-14 NOTE — Progress Notes (Signed)
Echo Lab  2D Echocardiogram completed.  Willamae Demby L Jatasia Gundrum, RDCS 05/14/2013 3:03 PM

## 2013-05-14 NOTE — ED Notes (Signed)
Pt in via EMS, was called out to house after patients sister found her on the floor, pt was alert but not answering questions appropriately, speech was noted to be slurred, follows some commands, last seen normal was 3pm yesterday when sister spoke with patient on the phone and she was asymptomatic, patient lives alone normally and functions independently. At this time patient alert to person, follows some commands, does not answer questions appropriately, alert, no distress noted. IV started PTA.

## 2013-05-14 NOTE — Evaluation (Signed)
Clinical/Bedside Swallow Evaluation Patient Details  Name: Diamond Miller MRN: [id number] Date of Birth: Oct 07, 1932  Today's Date: 05/14/2013 Time: 1630-1700 SLP Time Calculation (min): 30 min  Past Medical History:  Past Medical History  Diagnosis Date  . Back pain Chronic back pain  . Scoliosis   . Liver cirrhosis   . Hypertension   . Acid reflux    Past Surgical History:  Past Surgical History  Procedure Laterality Date  . Knee repalcement    . Replacement total knee    . Eye surgery     HPI:  79 year old with PMH of primary biliary cirrhosis, hypertension, no history of diabetes, + GERD, who presents with a slurry speech and altered mental status. CT head showed possible ischemic stroke left basal ganglia.  Pt failed RN stroke swallow screen in ED.     Assessment / Plan / Recommendation Clinical Impression  Pt presents with no overt s/s of aspiration with PO trials. She does demonstrate impaired mastication (possibly in part due to lack of bottom dentures) and decreased manipulation and transit of soft solid textures, resulting in right-sided pocketing and oral residue. Recommend to initiate Dys 2 textures and thin liquids. SLP to follow for tolerance and readiness for advancement.    Aspiration Risk  Mild    Diet Recommendation Dysphagia 2 (Fine chop);Thin liquid   Liquid Administration via: Cup;Straw;No straw Medication Administration: Whole meds with puree Supervision: Staff to assist with self feeding;Full supervision/cueing for compensatory strategies Compensations: Slow rate;Small sips/bites Postural Changes and/or Swallow Maneuvers: Seated upright 90 degrees;Upright 30-60 min after meal    Other  Recommendations Oral Care Recommendations: Oral care BID   Follow Up Recommendations  Inpatient Rehab    Frequency and Duration min 2x/week  2 weeks   Pertinent Vitals/Pain N/A    SLP Swallow Goals  Please refer to Care Plan.   Swallow Study Prior  Functional Status       General Date of Onset: 05/14/13 HPI: 79 year old with PMH of primary biliary cirrhosis, hypertension, no history of diabetes, + GERD, who presents with a slurry speech and altered mental status. CT head showed possible ischemic stroke left basal ganglia.  Pt failed RN stroke swallow screen in ED.   Type of Study: Bedside swallow evaluation Previous Swallow Assessment: none in chart Diet Prior to this Study: NPO Temperature Spikes Noted: No Respiratory Status: Room air History of Recent Intubation: No Behavior/Cognition: Alert;Cooperative;Requires cueing;Other (comment) (decreased ability to follow commands) Oral Cavity - Dentition: Dentures, top (bottom dentures at home) Self-Feeding Abilities: Needs assist;Other (Comment) (pt would only use right hand, which is weaker side) Patient Positioning: Upright in bed Baseline Vocal Quality: Clear Volitional Cough: Strong;Other (Comment) (required cueing) Volitional Swallow: Able to elicit (decreased hyolaryngeal movement upon palpation)    Oral/Motor/Sensory Function Overall Oral Motor/Sensory Function: Other (comment) (limited assessment due to decreased ability to follow comman) Lingual ROM: Within Functional Limits Lingual Symmetry: Within Functional Limits Facial Symmetry: Within Functional Limits Mandible: Within Functional Limits   Ice Chips Ice chips: Impaired Presentation: Spoon Pharyngeal Phase Impairments: Suspected delayed Swallow;Decreased hyoid-laryngeal movement   Thin Liquid Thin Liquid: Impaired Presentation: Cup;Self Fed;Straw Pharyngeal  Phase Impairments: Suspected delayed Swallow;Decreased hyoid-laryngeal movement    Nectar Thick Nectar Thick Liquid: Not tested   Honey Thick Honey Thick Liquid: Not tested   Puree Puree: Impaired Presentation: Self Fed;Spoon Pharyngeal Phase Impairments: Suspected delayed Swallow;Decreased hyoid-laryngeal movement   Solid   GO    Solid:  Impaired Presentation:  Self Fed Oral Phase Impairments: Impaired anterior to posterior transit;Reduced lingual movement/coordination Oral Phase Functional Implications: Right lateral sulci pocketing;Oral residue Pharyngeal Phase Impairments: Suspected delayed Swallow;Decreased hyoid-laryngeal movement      Maxcine Ham, M.A. CCC-SLP [phone]  Maxcine Ham 05/14/2013,5:12 PM

## 2013-05-15 DIAGNOSIS — I6350 Cerebral infarction due to unspecified occlusion or stenosis of unspecified cerebral artery: Principal | ICD-10-CM

## 2013-05-15 LAB — TROPONIN I
Troponin I: 0.71 ng/mL (ref <0.30)
Troponin I: 0.64 ng/mL (ref <0.30)

## 2013-05-15 LAB — LIPID PANEL
Cholesterol: 168 mg/dL (ref 0–200)
Total CHOL/HDL Ratio: 4.4 RATIO
Triglycerides: 123 mg/dL (ref <150)
VLDL: 25 mg/dL (ref 0–40)

## 2013-05-15 LAB — HEMOGLOBIN A1C: Mean Plasma Glucose: 108 mg/dL (ref <117)

## 2013-05-15 LAB — TSH: TSH: 0.354 u[IU]/mL (ref 0.350–4.500)

## 2013-05-15 MED ORDER — CLOPIDOGREL BISULFATE 75 MG PO TABS
75.0000 mg | ORAL_TABLET | Freq: Every day | ORAL | Status: DC
  Administered 2013-05-16 – 2013-05-19 (×4): 75 mg via ORAL
  Filled 2013-05-15 (×5): qty 1

## 2013-05-15 MED ORDER — CLOPIDOGREL BISULFATE 75 MG PO TABS
75.0000 mg | ORAL_TABLET | Freq: Once | ORAL | Status: AC
  Administered 2013-05-15: 75 mg via ORAL
  Filled 2013-05-15: qty 1

## 2013-05-15 NOTE — H&P (Signed)
Internal Medicine Attending Admission Note Date: 05/15/2013  Patient name: Diamond Miller Medical record number: [id number] Date of birth: [birth year] Age: 79 y.o. Gender: female  I saw and evaluated the patient. I reviewed the resident's note and I agree with the resident's findings and plan as documented in the resident's note, with the following additional comments.  Chief Complaint(s): Slurred speech, altered mental status, found down at home by EMS  History - key components related to admission: Patient is a 79 year old woman with history of primary biliary cirrhosis, hypertension, GERD, and other problems as outlined in the medical history, admitted with above symptoms.  She was reportedly found down at home by EMS when they responded to a cough from her sister.  She is currently alert and without acute complaint.  Physical Exam - key components related to admission:  Filed Vitals:   05/15/13 0400 05/15/13 0600 05/15/13 1037 05/15/13 1130  BP: 155/58 148/86 152/72 124/55  Pulse: 77 74 77 104  Temp: 97.8 F (36.6 C) 97.9 F (36.6 C) 97.6 F (36.4 C)   TempSrc: Oral Oral Oral   Resp: 20 20 20 22   Height:      Weight:      SpO2: 94% 98% 96% 92%   General: Alert, no distress Lungs: Clear Heart: Regular; no extra sounds or murmurs Abdomen: Bowel sounds present, soft, nontender Extremities: No edema Neurologic: Alert and oriented; speech appears normal (patient reports that she needs her dentures); no evident cranial nerve deficits; strength appears symmetrical   Lab results:   Basic Metabolic Panel:  Recent Labs  27/25/36 0825 05/14/13 0834  NA 140 139  K 3.6 3.5  CL 102 102  CO2 27  --   GLUCOSE 110* 113*  BUN 16 15  CREATININE 0.78 0.80  CALCIUM 9.2  --     Liver Function Tests:  Recent Labs  05/14/13 0825  AST 30  ALT 15  ALKPHOS 140*  BILITOT 0.4  PROT 6.9  ALBUMIN 3.2*     CBC:  Recent Labs  05/14/13 0825 05/14/13 0834  WBC 11.8*  --    HGB 13.8 14.3  HCT 39.4 42.0  MCV 86.6  --   PLT 274  --     Recent Labs  05/14/13 0825  NEUTROABS 9.3*  LYMPHSABS 1.6  MONOABS 0.7  EOSABS 0.1  BASOSABS 0.0    Cardiac Enzymes:  Recent Labs  05/14/13 0825 05/14/13 1930 05/15/13 0155 05/15/13 0715  CKTOTAL  --  81  --   --   CKMB  --  2.0  --   --   TROPONINI 0.78* 0.58* 0.71* 0.67*     Hemoglobin A1C:  Recent Labs  05/14/13 1930  HGBA1C 5.4   Fasting Lipid Panel:  Recent Labs  05/15/13 0155  CHOL 168  HDL 38*  LDLCALC 105*  TRIG 123  CHOLHDL 4.4    Thyroid Function Tests:  Recent Labs  05/14/13 1930  TSH 0.354     Coagulation:  Recent Labs  05/14/13 0825  INR 1.02        Urinalysis    Component Value Date/Time   COLORURINE YELLOW 05/14/2013 0846   APPEARANCEUR CLEAR 05/14/2013 0846   LABSPEC 1.011 05/14/2013 0846   PHURINE 7.0 05/14/2013 0846   GLUCOSEU NEGATIVE 05/14/2013 0846   HGBUR NEGATIVE 05/14/2013 0846   BILIRUBINUR NEGATIVE 05/14/2013 0846   KETONESUR NEGATIVE 05/14/2013 0846   PROTEINUR NEGATIVE 05/14/2013 0846   UROBILINOGEN 0.2 05/14/2013 0846   NITRITE NEGATIVE  05/14/2013 0846   LEUKOCYTESUR NEGATIVE 05/14/2013 0846      Imaging results:  Ct Head Wo Contrast  05/14/2013   CLINICAL DATA:  Slurred speech, found on the floor  EXAM: CT HEAD WITHOUT CONTRAST  TECHNIQUE: Contiguous axial images were obtained from the base of the skull through the vertex without intravenous contrast.  COMPARISON:  None.  FINDINGS: No skull fracture is noted. There is mucosal thickening with air-fluid level left sphenoid sinus. The mastoid air cells are unremarkable. No intracranial hemorrhage, mass effect or midline shift. Mild cerebral atrophy. There is probable old infarct with mild encephalomalacia in right frontal lobe. There is decreased attenuation in left basal ganglia measures about 1.3 cm. This is highly suspicious for evolving infarct/ischemia. Best seen in axial image 17.  Further evaluation with MRI with diffusion imaging is recommended. No mass lesion is noted on this unenhanced scan.  IMPRESSION: No intracranial hemorrhage, mass effect or midline shift. Mild cerebral atrophy. There is probable old infarct with mild encephalomalacia in right frontal lobe. There is decreased attenuation in left basal ganglia measures about 1.3 cm. This is highly suspicious for evolving infarct/ischemia. Best seen in axial image 17. Further evaluation with MRI with diffusion imaging is recommended.  These results were called by telephone at the time of interpretation on 05/14/2013 at 9:44 AM to Dr. Cathren Laine , who verbally acknowledged these results.   Electronically Signed   By: Natasha Mead M.D.   On: 05/14/2013 09:45   Mr Maxine Glenn Head Wo Contrast  05/14/2013   CLINICAL DATA:  Difficulty with speech. Numbness in hands. Cirrhosis. Hypertension.  EXAM: MRI HEAD WITHOUT CONTRAST  MRA HEAD WITHOUT CONTRAST  TECHNIQUE: Multiplanar, multiecho pulse sequences of the brain and surrounding structures were obtained without intravenous contrast. Angiographic images of the head were obtained using MRA technique without contrast.  COMPARISON:  05/14/2013 head CT.  FINDINGS: MRI HEAD FINDINGS  Small to moderate size acute infarct involving portions of the left operculum region/ subinsular region, posterior aspect of the left internal capsule, posterior left coronal radiata and left frontal lobe. Small amount of hemorrhage within the posterior left lenticular nucleus acute infarct.  Remote right operculum region infarct extending to the right corona radiata with encephalomalacia.  Remote small right cerebellar infarcts.  Mild small vessel disease type changes.  Global atrophy without hydrocephalus.  No intracranial mass lesion noted on this unenhanced exam.  Mild cervical spondylotic changes. Cervical medullary junction, pituitary region, pineal region and orbital structures unremarkable.  Partial opacification  left sphenoid sinus air cell.  MRA HEAD FINDINGS  M1 segment right middle cerebral artery mild to slightly moderate narrowing.  Middle cerebral artery branch vessel narrowing, irregularity and areas of occlusion bilaterally more notable on the left.  Left vertebral artery ends in a posterior inferior cerebellar artery distribution with vessels narrowed and irregular.  Right narrowing of the distal right vertebral artery.  Moderate to marked narrowing proximal to mid basilar artery.  Nonvisualization the anterior inferior cerebellar arteries.  Moderate to marked narrowing involving portions of the superior cerebellar arteries and posterior cerebral arteries bilaterally most notable involving the right posterior cerebral artery.  No aneurysm identified.  Fetal contribution to the posterior cerebral arteries.  IMPRESSION: Small to moderate size acute infarct involving portions of the left operculum region/ subinsular region, posterior aspect of the left internal capsule, posterior left coronal radiata and left frontal lobe. Small amount of hemorrhage within the posterior left lenticular nucleus acute infarct.  Remote right operculum region  infarct extending to the right corona radiata with encephalomalacia.  Remote small right cerebellar infarcts.  Mild small vessel disease type changes.  Global atrophy without hydrocephalus.  Partial opacification left sphenoid sinus air cell.  Intracranial atherosclerotic type changes as detailed above.  These results will be called to the ordering clinician or representative by the Radiologist Assistant, and communication documented in the PACS Dashboard.   Electronically Signed   By: Bridgett Larsson M.D.   On: 05/14/2013 17:31   Mr Brain Wo Contrast  05/14/2013   CLINICAL DATA:  Difficulty with speech. Numbness in hands. Cirrhosis. Hypertension.  EXAM: MRI HEAD WITHOUT CONTRAST  MRA HEAD WITHOUT CONTRAST  TECHNIQUE: Multiplanar, multiecho pulse sequences of the brain and  surrounding structures were obtained without intravenous contrast. Angiographic images of the head were obtained using MRA technique without contrast.  COMPARISON:  05/14/2013 head CT.  FINDINGS: MRI HEAD FINDINGS  Small to moderate size acute infarct involving portions of the left operculum region/ subinsular region, posterior aspect of the left internal capsule, posterior left coronal radiata and left frontal lobe. Small amount of hemorrhage within the posterior left lenticular nucleus acute infarct.  Remote right operculum region infarct extending to the right corona radiata with encephalomalacia.  Remote small right cerebellar infarcts.  Mild small vessel disease type changes.  Global atrophy without hydrocephalus.  No intracranial mass lesion noted on this unenhanced exam.  Mild cervical spondylotic changes. Cervical medullary junction, pituitary region, pineal region and orbital structures unremarkable.  Partial opacification left sphenoid sinus air cell.  MRA HEAD FINDINGS  M1 segment right middle cerebral artery mild to slightly moderate narrowing.  Middle cerebral artery branch vessel narrowing, irregularity and areas of occlusion bilaterally more notable on the left.  Left vertebral artery ends in a posterior inferior cerebellar artery distribution with vessels narrowed and irregular.  Right narrowing of the distal right vertebral artery.  Moderate to marked narrowing proximal to mid basilar artery.  Nonvisualization the anterior inferior cerebellar arteries.  Moderate to marked narrowing involving portions of the superior cerebellar arteries and posterior cerebral arteries bilaterally most notable involving the right posterior cerebral artery.  No aneurysm identified.  Fetal contribution to the posterior cerebral arteries.  IMPRESSION: Small to moderate size acute infarct involving portions of the left operculum region/ subinsular region, posterior aspect of the left internal capsule, posterior left  coronal radiata and left frontal lobe. Small amount of hemorrhage within the posterior left lenticular nucleus acute infarct.  Remote right operculum region infarct extending to the right corona radiata with encephalomalacia.  Remote small right cerebellar infarcts.  Mild small vessel disease type changes.  Global atrophy without hydrocephalus.  Partial opacification left sphenoid sinus air cell.  Intracranial atherosclerotic type changes as detailed above.  These results will be called to the ordering clinician or representative by the Radiologist Assistant, and communication documented in the PACS Dashboard.   Electronically Signed   By: Bridgett Larsson M.D.   On: 05/14/2013 17:31    Other results: EKG: Sinus rhythm; borderline left axis deviation  Assessment & Plan by Problem:  1. Stroke.  Plans include monitor; antiplatelet therapy; OT/PT/speech therapy; supportive care; workup to include TEE as per neurology consult.  OT/PT have recommended a skilled nursing facility placement.  2.  Elevated troponin.  No evidence of acute MI by EKG; no chest pain.  Cardiology has been consulted, and they felt that the troponin elevation may represent demand ischemia.  They advised antiplatelet therapy and statin, with outpatient Myoview  in 3-4 weeks after the CVA to assess for possible coronary artery disease.  3.  Altered mental status.  Improved today.  4.  Other problems and plans as per the resident physician's note.

## 2013-05-15 NOTE — Evaluation (Signed)
Physical Therapy Evaluation Patient Details Name: Diamond Miller MRN: [id number] DOB: 11/22/1932 Today's Date: 05/15/2013 Time: 7829-5621 PT Time Calculation (min): 23 min  PT Assessment / Plan / Recommendation History of Present Illness  pt presents with L Basal Ganglia Infarct.    Clinical Impression  Pt with decreased cognition and poor awareness.  Very concerned if pt D/Cs to home as she lives alone.  At this time safest D/C plan is for SNF.  Will continue to follow.      PT Assessment  Patient needs continued PT services    Follow Up Recommendations  SNF    Does the patient have the potential to tolerate intense rehabilitation      Barriers to Discharge Decreased caregiver support      Equipment Recommendations  None recommended by PT    Recommendations for Other Services     Frequency Min 4X/week    Precautions / Restrictions Precautions Precautions: Fall Restrictions Weight Bearing Restrictions: No   Pertinent Vitals/Pain Deneid pain.        Mobility  Bed Mobility Bed Mobility: Supine to Sit;Sitting - Scoot to Edge of Bed;Sit to Supine Supine to Sit: 3: Mod assist;With rails Sitting - Scoot to Edge of Bed: 1: +1 Total assist Sit to Supine: 3: Mod assist Details for Bed Mobility Assistance: cues for sequencing and safety.   Transfers Transfers: Sit to Stand;Stand to Sit Sit to Stand: 3: Mod assist;With upper extremity assist;From bed Stand to Sit: 3: Mod assist;With upper extremity assist;To bed Details for Transfer Assistance: cues for use of UEs and for making sure her hips were all the way on the bed when coming to sitting.  Repeated transfer x3 for strengthening and technique.   Ambulation/Gait Ambulation/Gait Assistance: Not tested (comment) Stairs: No Wheelchair Mobility Wheelchair Mobility: No Modified Rankin (Stroke Patients Only) Pre-Morbid Rankin Score: No significant disability Modified Rankin: Severe disability    Exercises     PT  Diagnosis: Difficulty walking;Generalized weakness  PT Problem List: Decreased strength;Decreased activity tolerance;Decreased balance;Decreased mobility;Decreased coordination;Decreased cognition;Decreased knowledge of use of DME;Decreased safety awareness PT Treatment Interventions: DME instruction;Gait training;Stair training;Functional mobility training;Therapeutic activities;Therapeutic exercise;Balance training;Neuromuscular re-education;Patient/family education     PT Goals(Current goals can be found in the care plan section) Acute Rehab PT Goals Patient Stated Goal: None stated.   PT Goal Formulation: With patient Time For Goal Achievement: 05/29/13 Potential to Achieve Goals: Good  Visit Information  Last PT Received On: 05/15/13 Assistance Needed: +2 (to ambulate) History of Present Illness: pt presents with L Basal Ganglia Infarct.         Prior Functioning  Home Living Family/patient expects to be discharged to:: Skilled nursing facility Living Arrangements: Alone Type of Home: Apartment  Lives With: Alone Prior Function Level of Independence: Needs assistance Gait / Transfers Assistance Needed: Uses cane Communication Communication: No difficulties Dominant Hand: Right    Cognition  Cognition Arousal/Alertness: Awake/alert Behavior During Therapy: WFL for tasks assessed/performed Overall Cognitive Status: Impaired/Different from baseline Area of Impairment: Memory;Safety/judgement;Awareness;Problem solving;Orientation Orientation Level: Time;Situation Memory: Decreased short-term memory Safety/Judgement: Decreased awareness of safety;Decreased awareness of deficits Awareness: Emergent;Anticipatory Problem Solving: Slow processing;Decreased initiation;Difficulty sequencing;Requires verbal cues;Requires tactile cues    Extremity/Trunk Assessment Upper Extremity Assessment Upper Extremity Assessment: Defer to OT evaluation Lower Extremity Assessment Lower  Extremity Assessment: Generalized weakness   Balance Balance Balance Assessed: Yes Static Sitting Balance Static Sitting - Balance Support: Bilateral upper extremity supported;Feet unsupported Static Sitting - Level of Assistance: 4: Min assist Static Sitting -  Comment/# of Minutes: pt tends to lean posteriorly and when cued to attend to balance pt blames it on her Scoliosis and not aware that she was falling over.    End of Session PT - End of Session Equipment Utilized During Treatment: Gait belt Activity Tolerance: Patient tolerated treatment well Patient left: in bed;with call bell/phone within reach;with bed alarm set Nurse Communication: Mobility status  GP     Sunny Schlein, [place] [phone] 05/15/2013, 10:33 AM

## 2013-05-15 NOTE — Evaluation (Signed)
Speech Language Pathology Evaluation Patient Details Name: Diamond Miller MRN: [id number] DOB: [birth year] Today's Date: 05/15/2013 Time: 4098-1191 SLP Time Calculation (min): 47 min  Problem List:  Patient Active Problem List   Diagnosis Date Noted  . Acute ischemic stroke 05/14/2013  . Elevated troponin I level 05/14/2013  . Stroke 05/14/2013  . Altered mental status 05/14/2013  . LEG EDEMA, BILATERAL 09/07/2010  . BILIARY CIRRHOSIS, PRIMARY 06/01/2010  . NONSPEC ELEVATION OF LEVELS OF TRANSAMINASE/LDH 03/19/2010  . CHEST WALL PAIN, ANTERIOR 02/26/2009  . DIABETES MELLITUS, TYPE I 08/25/2008  . UNSPECIFIED IRON DEFICIENCY ANEMIA 08/25/2008  . HYPERTENSION 08/25/2008  . Esophageal reflux 08/25/2008  . NAUSEA ALONE 08/25/2008   Past Medical History:  Past Medical History  Diagnosis Date  . Back pain Chronic back pain  . Scoliosis   . Liver cirrhosis   . Hypertension   . Acid reflux    Past Surgical History:  Past Surgical History  Procedure Laterality Date  . Knee repalcement    . Replacement total knee    . Eye surgery     HPI:  79 year old with PMH of primary biliary cirrhosis, hypertension, no history of diabetes, + GERD, who presents with a slurry speech and altered mental status. CT head showed possible ischemic stroke left basal ganglia.  MRI showed acute corona radiata CVA, remote cerebellar cva.   Pt failed RN stroke swallow screen in Ed and underwent BSE by SLP placed on dys2/thin diet.  Pt to be seen today for dysphagia tx and speech evaluation.    Assessment / Plan / Recommendation Clinical Impression  Pt presents with suspected moderate cognitive linguistic deficits  - ? baseline function and no family present.  Pt with delayed processing of information and decreased attention resulting in decreased abilities to follow directions.  Pt also had difficulties stating her correct age and date - stating she is 29 and the year is 59.  Speech was functional  without significant dysarthria.   Expression with decreased semantic usage impairing pt ability to introduce topics and identifying people by name during conversation - often using "she" to speak of sister.  She was able to carry on conversation with MD re: her medications but did not recall name of specific medication.   Pt named 8 animals in 60 seconds, demonstrating decreased semantics.  Pt perseverates on previous medical procedures, etc and had difficulty transitioning topics.  Skilled intervention included educating pt to results and implementing compensation strategies to facilitate cognitive progression. Recommend pt have skilled SLP to maximize her function and decrease caregiver burden.  She would benefit from 24/7 supervision for safety.       SLP Assessment  Patient needs continued Speech Lanaguage Pathology Services    Follow Up Recommendations  Skilled Nursing facility;24 hour supervision/assistance    Frequency and Duration min 2x/week  2 weeks   Pertinent Vitals/Pain Afebrile, decreased   SLP Goals  SLP Goals Potential to Achieve Goals: Fair Potential Considerations: Family/community support;Previous level of function;Ability to learn/carryover information (? 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  Overall Cognitive Status: Impaired/Different from baseline Arousal/Alertness: Awake/alert Orientation Level: Oriented to person;Oriented to place;Oriented to situation;Disoriented to time Attention: Selective;Sustained Sustained Attention: Impaired Sustained Attention Impairment: Verbal basic;Functional basic Selective Attention: Impaired Selective Attention Impairment: Verbal basic;Functional basic Memory: Impaired (did not recall seeing md last night) Memory  Impairment: Storage deficit;Retrieval deficit Awareness: Impaired  (decreased awareness to deficits with stating numbers) Awareness Impairment: Intellectual impairment Problem Solving: Impaired Problem Solving Impairment: Functional basic (needs verbal cues to check for oral stasis during meal) Executive Function: Reasoning;Sequencing;Organizing;Decision Making;Initiating;Self Monitoring;Self Correcting Reasoning: Impaired Reasoning Impairment: Verbal complex Sequencing: Impaired Organizing: Impaired Decision Making: Impaired Initiating: Impaired Self Monitoring: Impaired Self Correcting: Impaired Behaviors: Confabulation;Restless Safety/Judgment: Impaired (poor judgement to ramifications of deficits )    Comprehension  Auditory Comprehension Yes/No Questions: Impaired Basic Immediate Environment Questions: 75-100% accurate Complex Questions: 75-100% accurate (with delay) Commands: Impaired One Step Basic Commands: 75-100% accurate Two Step Basic Commands: 75-100% accurate Conversation: Complex Other Conversation Comments: pt able to discuss with md medication including aspirin and contraindication with gi issue Visual Recognition/Discrimination Discrimination: Not tested Reading Comprehension Reading Status: Not tested    Expression Expression Primary Mode of Expression: Verbal Verbal Expression Overall Verbal Expression: Impaired Initiation: No impairment Level of Generative/Spontaneous Verbalization: Conversation Repetition: No impairment Naming: No impairment (basic pictures) Pragmatics: Impairment Impairments: Abnormal affect;Monotone;Topic maintenance;Turn Taking Non-Verbal Means of Communication: Not applicable Other Verbal Expression Comments: Difficult for pt to express herself adequately at times, decreased semantics and introduction to topic noted - ? baseline function.   Written Expression Dominant Hand: Right Written Expression: Not tested   Oral / Motor Oral Motor/Sensory Function Overall Oral Motor/Sensory Function:  (pt  reports left side of face "messed up" due to broken jaw with reconstruction from rib bone) Labial ROM: Within Functional Limits Labial Symmetry: Within Functional Limits Labial Strength: Within Functional Limits Labial Sensation: Within Functional Limits Lingual ROM: Within Functional Limits Lingual Symmetry: Within Functional Limits Lingual Strength: Within Functional Limits Lingual Sensation: Within Functional Limits Facial ROM: Within Functional Limits Facial Symmetry: Left droop Facial Strength: Within Functional Limits Facial Sensation: Reduced Velum: Within Functional Limits Mandible: Within Functional Limits Motor Speech Respiration: Within functional limits Phonation: Normal Resonance: Within functional limits Articulation: Within functional limitis Intelligibility: Intelligible Motor Planning: Witnin functional limits Motor Speech Errors: Not applicable   GO     Mills Koller, MS [facility] SLP [phone]

## 2013-05-15 NOTE — Evaluation (Signed)
Occupational Therapy Evaluation Patient Details Name: Diamond Miller MRN: [id number] DOB: 12/13/1932 Today's Date: 05/15/2013 Time: 4098-1191 OT Time Calculation (min): 43 min  OT Assessment / Plan / Recommendation History of present illness pt presents with L Basal Ganglia Infarct.     Clinical Impression   PT admitted with Lt basal Ganglia. Pt currently with functional limitiations due to the deficits listed below (see OT problem list). Cognitive impairments unaware of deficits at this time. HIGH FALL RISK Pt will benefit from skilled OT to increase their independence and safety with adls and balance to allow discharge SNF.     OT Assessment  Patient needs continued OT Services    Follow Up Recommendations  SNF;Supervision/Assistance - 24 hour    Barriers to Discharge      Equipment Recommendations  3 in 1 bedside comode;Wheelchair (measurements OT);Wheelchair cushion (measurements OT)    Recommendations for Other Services    Frequency  Min 2X/week    Precautions / Restrictions Precautions Precautions: Fall Restrictions Weight Bearing Restrictions: No   Pertinent Vitals/Pain None reported    ADL  Eating/Feeding: Set up Where Assessed - Eating/Feeding: Chair (coughing noted with each drink of water during session) Grooming: Wash/dry hands;Maximal assistance Where Assessed - Grooming: Supported standing Toilet Transfer: Maximal Dentist Method: Sit to Barista: Raised toilet seat with arms (or 3-in-1 over toilet) Equipment Used: Gait belt Transfers/Ambulation Related to ADLs: Pt reports being able to return home. Pt allowed to attempt sit,>Stand x6 unable to achieve static standing due to posterior lean. pt reports the balance is due to her back and knee. Pt when asked further reports she had therapy for 2 weeks on her knee and could walk after that. Pt with right LE toe dragging and demonstrates ataxic gait. Pt with RT side  weakness  ADL Comments: Pt demonstrates lack of awareness to all deficits. pt needs cues to problem solve basic adls. pt with strong posterior lean at eob sitting. Pt is unsafe to d/c home. Pt unable to report events that have occurred within session. tp with no recall of Dr Pearlean Brownie education. pt unable to recall admission due to CVA    OT Diagnosis: Generalized weakness;Cognitive deficits  OT Problem List: Decreased strength;Decreased activity tolerance;Impaired balance (sitting and/or standing);Decreased safety awareness;Decreased knowledge of use of DME or AE;Decreased knowledge of precautions OT Treatment Interventions: Self-care/ADL training;Therapeutic exercise;Neuromuscular education;DME and/or AE instruction;Therapeutic activities;Cognitive remediation/compensation;Patient/family education;Balance training   OT Goals(Current goals can be found in the care plan section) Acute Rehab OT Goals Patient Stated Goal: None stated.   OT Goal Formulation: Patient unable to participate in goal setting Time For Goal Achievement: 05/29/13 Potential to Achieve Goals: Good  Visit Information  Last OT Received On: 05/15/13 Assistance Needed: +1 History of Present Illness: pt presents with L Basal Ganglia Infarct.         Prior Functioning     Home Living Family/patient expects to be discharged to:: Skilled nursing facility Living Arrangements: Alone Type of Home: Apartment Additional Comments: sister present and reports she does not have a POA or (A) upon d/c. Pt with cogntiive deficits and unable to make a decision on safety. SISTer reports multiple falls recently. Pt self reports more than 5 falls at home in last 6 months and majority occur at night  Lives With: Alone Prior Function Level of Independence: Independent with assistive device(s) Gait / Transfers Assistance Needed: Uses cane Communication Communication: Expressive difficulties (slurred speech, word finding deficts) Dominant  Hand:  Right         Vision/Perception Vision - History Baseline Vision: No visual deficits Patient Visual Report: No change from baseline   Cognition  Cognition Arousal/Alertness: Awake/alert Behavior During Therapy: Impulsive Overall Cognitive Status: Impaired/Different from baseline Area of Impairment: Orientation;Memory;Attention;Following commands;Awareness;Safety/judgement;Problem solving Orientation Level: Disoriented to;Time;Situation (only reports fall and states "maybe a heart attack") Current Attention Level: Sustained Memory: Decreased recall of precautions;Decreased short-term memory Following Commands: Follows one step commands inconsistently Safety/Judgement: Decreased awareness of safety;Decreased awareness of deficits Awareness: Emergent;Anticipatory Problem Solving: Slow processing;Difficulty sequencing General Comments: Pt reports date as december 15 and tomorrow is Christmas. pt asking sister if she went by her apartment and sister has educated patient of this information this morning. Sister states "Urijah thats why you have your teeth I brought you, your teeth, Remember?" pt becoming agitiated with cognitive challenges. Pt asked to locate family number in addres book adn pt states "I am about to cuss you out like Myrtle would"    Extremity/Trunk Assessment Upper Extremity Assessment Upper Extremity Assessment: RUE deficits/detail RUE Deficits / Details: 3+ out 5 decr supintion decr sensation undershooting when reaching for paper towel decr grasp RUE Sensation: decreased light touch;decreased proprioception RUE Coordination: decreased fine motor;decreased gross motor Lower Extremity Assessment Lower Extremity Assessment: Defer to PT evaluation Cervical / Trunk Assessment Cervical / Trunk Assessment: Kyphotic     Mobility Bed Mobility Bed Mobility: Supine to Sit;Sitting - Scoot to [place] of Bed;Sit to Supine Supine to Sit: 3: Mod assist;With rails Sitting -  Scoot to Edge of Bed: 1: +1 Total assist Sit to Supine: 3: Mod assist Details for Bed Mobility Assistance: EOB on arrival with posterior lean Transfers Transfers: Sit to Stand;Stand to Sit Sit to Stand: 2: Max assist;With upper extremity assist;From chair/3-in-1 Stand to Sit: 2: Max assist;With upper extremity assist;To chair/3-in-1 Details for Transfer Assistance: impulsive attempting to sit before proper alignment with chair to safetly sit     Exercise     Balance Balance Balance Assessed: Yes Static Sitting Balance Static Sitting - Balance Support: Bilateral upper extremity supported;Feet unsupported Static Sitting - Level of Assistance: 4: Min assist Static Sitting - Comment/# of Minutes: pt tends to lean posteriorly and when cued to attend to balance pt blames it on her Scoliosis and not aware that she was falling over.     End of Session OT - End of Session Activity Tolerance: Patient tolerated treatment well Patient left: in chair;with call bell/phone within reach;with chair alarm set Nurse Communication: Mobility status;Precautions  GO     Harolyn Rutherford 05/15/2013, 12:06 PM Pager: [phone]

## 2013-05-15 NOTE — Progress Notes (Signed)
Subjective: Diamond Miller is doing well this morning, very chatty and asking to go home.  No complaints, strength good and no further numbness or tingling.  She states that she had left jaw surgery years ago thus the left side of her face looks different than the right at baseline, but she does not think her face looks droopy when she looks in the mirror.   Objective: Vital signs in last 24 hours: Filed Vitals:   05/14/13 2357 05/15/13 0151 05/15/13 0400 05/15/13 0600  BP: 143/61 141/66 155/58 148/86  Pulse: 69 71 77 74  Temp: 97.6 F (36.4 C) 98.1 F (36.7 C) 97.8 F (36.6 C) 97.9 F (36.6 C)  TempSrc: Oral Oral Oral Oral  Resp: 20 20 20 20   Height:      Weight:      SpO2: 100% 99% 94% 98%   Weight change:  No intake or output data in the 24 hours ending 05/15/13 0842 PEX General: alert, cooperative, NAD HEENT: NCAT, sclerae anicteric  Neck: supple, no lymphadenopathy Lungs: clear to ascultation bilaterally, normal work of respiration, no wheezes, rales, ronchi Heart: regular rate and rhythm, no murmurs, gallops, or rubs Abdomen: soft, non-tender, non-distended, normal bowel sounds Extremities: 2+ DP/PT pulses bilaterally, no cyanosis, clubbing, or edema Neurologic: alert & oriented X3, cranial nerves II-XII intact, strength grossly intact, sensation intact to light touch; no slurred speech, no facial droop appreciated  Lab Results: Basic Metabolic Panel:  Recent Labs Lab 05/14/13 0825 05/14/13 0834  NA 140 139  K 3.6 3.5  CL 102 102  CO2 27  --   GLUCOSE 110* 113*  BUN 16 15  CREATININE 0.78 0.80  CALCIUM 9.2  --    Liver Function Tests:  Recent Labs Lab 05/14/13 0825  AST 30  ALT 15  ALKPHOS 140*  BILITOT 0.4  PROT 6.9  ALBUMIN 3.2*   CBC:  Recent Labs Lab 05/14/13 0825 05/14/13 0834  WBC 11.8*  --   NEUTROABS 9.3*  --   HGB 13.8 14.3  HCT 39.4 42.0  MCV 86.6  --   PLT 274  --    Cardiac Enzymes:  Recent Labs Lab 05/14/13 0825  05/14/13 1930 05/15/13 0155 05/15/13 0715  CKTOTAL  --  81  --   --   CKMB  --  2.0  --   --   TROPONINI 0.78* 0.58* 0.71* 0.67*   Hemoglobin A1C:  Recent Labs Lab 05/14/13 1930  HGBA1C 5.4   Fasting Lipid Panel:  Recent Labs Lab 05/15/13 0155  CHOL 168  HDL 38*  LDLCALC 105*  TRIG 123  CHOLHDL 4.4   Thyroid Function Tests:  Recent Labs Lab 05/14/13 1930  TSH 0.354   Coagulation:  Recent Labs Lab 05/14/13 0825  LABPROT 13.2  INR 1.02   Urinalysis:  Recent Labs Lab 05/14/13 0846  COLORURINE YELLOW  LABSPEC 1.011  PHURINE 7.0  GLUCOSEU NEGATIVE  HGBUR NEGATIVE  BILIRUBINUR NEGATIVE  KETONESUR NEGATIVE  PROTEINUR NEGATIVE  UROBILINOGEN 0.2  NITRITE NEGATIVE  LEUKOCYTESUR NEGATIVE   Studies/Results: Ct Head Wo Contrast  05/14/2013   CLINICAL DATA:  Slurred speech, found on the floor  EXAM: CT HEAD WITHOUT CONTRAST  TECHNIQUE: Contiguous axial images were obtained from the base of the skull through the vertex without intravenous contrast.  COMPARISON:  None.  FINDINGS: No skull fracture is noted. There is mucosal thickening with air-fluid level left sphenoid sinus. The mastoid air cells are unremarkable. No intracranial hemorrhage, mass effect or  midline shift. Mild cerebral atrophy. There is probable old infarct with mild encephalomalacia in right frontal lobe. There is decreased attenuation in left basal ganglia measures about 1.3 cm. This is highly suspicious for evolving infarct/ischemia. Best seen in axial image 17. Further evaluation with MRI with diffusion imaging is recommended. No mass lesion is noted on this unenhanced scan.  IMPRESSION: No intracranial hemorrhage, mass effect or midline shift. Mild cerebral atrophy. There is probable old infarct with mild encephalomalacia in right frontal lobe. There is decreased attenuation in left basal ganglia measures about 1.3 cm. This is highly suspicious for evolving infarct/ischemia. Best seen in axial image  17. Further evaluation with MRI with diffusion imaging is recommended.  These results were called by telephone at the time of interpretation on 05/14/2013 at 9:44 AM to Dr. Cathren Laine , who verbally acknowledged these results.   Electronically Signed   By: Natasha Mead M.D.   On: 05/14/2013 09:45   Mr Maxine Glenn Head Wo Contrast  05/14/2013   CLINICAL DATA:  Difficulty with speech. Numbness in hands. Cirrhosis. Hypertension.  EXAM: MRI HEAD WITHOUT CONTRAST  MRA HEAD WITHOUT CONTRAST  TECHNIQUE: Multiplanar, multiecho pulse sequences of the brain and surrounding structures were obtained without intravenous contrast. Angiographic images of the head were obtained using MRA technique without contrast.  COMPARISON:  05/14/2013 head CT.  FINDINGS: MRI HEAD FINDINGS  Small to moderate size acute infarct involving portions of the left operculum region/ subinsular region, posterior aspect of the left internal capsule, posterior left coronal radiata and left frontal lobe. Small amount of hemorrhage within the posterior left lenticular nucleus acute infarct.  Remote right operculum region infarct extending to the right corona radiata with encephalomalacia.  Remote small right cerebellar infarcts.  Mild small vessel disease type changes.  Global atrophy without hydrocephalus.  No intracranial mass lesion noted on this unenhanced exam.  Mild cervical spondylotic changes. Cervical medullary junction, pituitary region, pineal region and orbital structures unremarkable.  Partial opacification left sphenoid sinus air cell.  MRA HEAD FINDINGS  M1 segment right middle cerebral artery mild to slightly moderate narrowing.  Middle cerebral artery branch vessel narrowing, irregularity and areas of occlusion bilaterally more notable on the left.  Left vertebral artery ends in a posterior inferior cerebellar artery distribution with vessels narrowed and irregular.  Right narrowing of the distal right vertebral artery.  Moderate to marked  narrowing proximal to mid basilar artery.  Nonvisualization the anterior inferior cerebellar arteries.  Moderate to marked narrowing involving portions of the superior cerebellar arteries and posterior cerebral arteries bilaterally most notable involving the right posterior cerebral artery.  No aneurysm identified.  Fetal contribution to the posterior cerebral arteries.  IMPRESSION: Small to moderate size acute infarct involving portions of the left operculum region/ subinsular region, posterior aspect of the left internal capsule, posterior left coronal radiata and left frontal lobe. Small amount of hemorrhage within the posterior left lenticular nucleus acute infarct.  Remote right operculum region infarct extending to the right corona radiata with encephalomalacia.  Remote small right cerebellar infarcts.  Mild small vessel disease type changes.  Global atrophy without hydrocephalus.  Partial opacification left sphenoid sinus air cell.  Intracranial atherosclerotic type changes as detailed above.  These results will be called to the ordering clinician or representative by the Radiologist Assistant, and communication documented in the PACS Dashboard.   Electronically Signed   By: Bridgett Larsson M.D.   On: 05/14/2013 17:31   Mr Brain Wo Contrast  05/14/2013  CLINICAL DATA:  Difficulty with speech. Numbness in hands. Cirrhosis. Hypertension.  EXAM: MRI HEAD WITHOUT CONTRAST  MRA HEAD WITHOUT CONTRAST  TECHNIQUE: Multiplanar, multiecho pulse sequences of the brain and surrounding structures were obtained without intravenous contrast. Angiographic images of the head were obtained using MRA technique without contrast.  COMPARISON:  05/14/2013 head CT.  FINDINGS: MRI HEAD FINDINGS  Small to moderate size acute infarct involving portions of the left operculum region/ subinsular region, posterior aspect of the left internal capsule, posterior left coronal radiata and left frontal lobe. Small amount of hemorrhage within  the posterior left lenticular nucleus acute infarct.  Remote right operculum region infarct extending to the right corona radiata with encephalomalacia.  Remote small right cerebellar infarcts.  Mild small vessel disease type changes.  Global atrophy without hydrocephalus.  No intracranial mass lesion noted on this unenhanced exam.  Mild cervical spondylotic changes. Cervical medullary junction, pituitary region, pineal region and orbital structures unremarkable.  Partial opacification left sphenoid sinus air cell.  MRA HEAD FINDINGS  M1 segment right middle cerebral artery mild to slightly moderate narrowing.  Middle cerebral artery branch vessel narrowing, irregularity and areas of occlusion bilaterally more notable on the left.  Left vertebral artery ends in a posterior inferior cerebellar artery distribution with vessels narrowed and irregular.  Right narrowing of the distal right vertebral artery.  Moderate to marked narrowing proximal to mid basilar artery.  Nonvisualization the anterior inferior cerebellar arteries.  Moderate to marked narrowing involving portions of the superior cerebellar arteries and posterior cerebral arteries bilaterally most notable involving the right posterior cerebral artery.  No aneurysm identified.  Fetal contribution to the posterior cerebral arteries.  IMPRESSION: Small to moderate size acute infarct involving portions of the left operculum region/ subinsular region, posterior aspect of the left internal capsule, posterior left coronal radiata and left frontal lobe. Small amount of hemorrhage within the posterior left lenticular nucleus acute infarct.  Remote right operculum region infarct extending to the right corona radiata with encephalomalacia.  Remote small right cerebellar infarcts.  Mild small vessel disease type changes.  Global atrophy without hydrocephalus.  Partial opacification left sphenoid sinus air cell.  Intracranial atherosclerotic type changes as detailed  above.  These results will be called to the ordering clinician or representative by the Radiologist Assistant, and communication documented in the PACS Dashboard.   Electronically Signed   By: Bridgett Larsson M.D.   On: 05/14/2013 17:31   Medications: I have reviewed the patient's current medications. Scheduled Meds: . atorvastatin  40 mg Oral q1800  . heparin  5,000 Units Subcutaneous Q8H  . metoprolol succinate  50 mg Oral Daily  . multivitamin with minerals  1 tablet Oral Daily  . pantoprazole  40 mg Oral Daily  . traZODone  150 mg Oral QHS   PRN Meds:.diazepam, morphine injection, nitroGLYCERIN, oxyCODONE Assessment/Plan: #Stroke: Patient presents with acute onset of slurred speech and right hand numbness. CT head, MRI/MRA brain results above.  She presented outside window for tPA.  Patient has risk factors for stroke, including age and hypertension.  Patient has history of primary biliary cirrhosis and was advised not to take aspirin by her gastroenterologist.   Stroke workup thus far: Bilateral carotid dopplers negative.  Echo with EF 65-70%, normal systolic function, no wall motion abnormalities.  Risk factor stratification: A1C 5.4%, LDL 105, TSH within normal limits.  - neurology consult, appreciate recs  - dysphagia 2 diet per SLP  - Lipitor 40 mg daily - Plavix 75  mg daily starting today per neurology (patient unwilling to take ASA due to recommendations by GI) - TEE Friday to rule-out PFO per neurology, cardiology aware (scheduled for Friday at 11a with Dr. Melburn Popper) - PT/OT consults, recommending SNF placement but patient not sure if she is willing to go   #Elevated troponin: Trop 0.78 on admission --> 0.58 --> 0.71 --> 0.67 --> 0.64 this afternoon.  Cardiology feels confident that elevation due to demand in setting of stroke.  Patient did not have history of MI.  EKG has no significant change compared to previous one on 10/18/11. She is on metoprolol 50 mg daily at home.   - Cardiology  consult, appreciate recs - continue metoprolol 50 mg daily, Lipitor 40 mg daily - TEE per above on Friday  - NST on Saturday   #HTN: BP stable at 148/86 today. On metoprolol 50 mg daily at home.  - permissive hypertension but continue metoprolol given demand ischemia  #GERD: on Nexium at home - continue PPI   #Primary biliary cirrhosis: 04/2010 liver biopsy was highly suggestive of PBC at early-stage.  Per Dr. Collier Bullock note, patient had negative anti-smooth muscle antibody and ANA homogenous + 1:320 titer.  She was treated with trial of Urso forte which resulted in dramatic diarrhea so she is not taking any medications currently.  LFTs on admission: AST 30, ALT 15, bilirubin 0.4, albumin 3.2, protein 6.9, and ALP 140.   #DVT PPX: Heparin subq TID   Dispo: Disposition is deferred at this time, awaiting improvement of current medical problems.  Anticipated discharge in approximately 3-5 day(s).   The patient does have a current PCP Lorie Phenix, MD) and does need an [facility] hospital follow-up appointment after discharge.   .Services Needed at time of discharge: Y = Yes, Blank = No PT:   OT:   RN:   Equipment:   Other:     LOS: 1 day   Rocco Serene, MD 05/15/2013, 8:42 AM

## 2013-05-15 NOTE — Progress Notes (Signed)
\  Subjective: No complaints. Speech in back to baseline, per patient. She denies any further upper extremity numbness.   Objective: Vital signs in last 24 hours: Temp:  [97.4 F (36.3 C)-99 F (37.2 C)] 97.6 F (36.4 C) (12/24 1037) Pulse Rate:  [69-104] 104 (12/24 1130) Resp:  [20-22] 22 (12/24 1130) BP: (124-155)/(55-86) 124/55 mmHg (12/24 1130) SpO2:  [92 %-100 %] 92 % (12/24 1130) Last BM Date: 05/14/13  Intake/Output from previous day:   Intake/Output this shift: Total I/O In: -  Out: 1 [Stool:1]  Medications Current Facility-Administered Medications  Medication Dose Route Frequency Provider Last Rate Last Dose  . atorvastatin (LIPITOR) tablet 40 mg  40 mg Oral q1800 Lorretta Harp, MD   40 mg at 05/14/13 1657  . clopidogrel (PLAVIX) tablet 75 mg  75 mg Oral Once Rocco Serene, MD      . Melene Muller ON 05/16/2013] clopidogrel (PLAVIX) tablet 75 mg  75 mg Oral Q breakfast Rocco Serene, MD      . diazepam (VALIUM) tablet 5 mg  5 mg Oral Q8H PRN Lorretta Harp, MD      . heparin injection 5,000 Units  5,000 Units Subcutaneous Q8H Lorretta Harp, MD   5,000 Units at 05/15/13 [phone]  . metoprolol succinate (TOPROL-XL) 24 hr tablet 50 mg  50 mg Oral Daily Lorretta Harp, MD   50 mg at 05/15/13 1059  . morphine 2 MG/ML injection 2 mg  2 mg Intravenous Q4H PRN Lorretta Harp, MD      . multivitamin with minerals tablet 1 tablet  1 tablet Oral Daily Lorretta Harp, MD   1 tablet at 05/15/13 1059  . nitroGLYCERIN (NITROSTAT) SL tablet 0.4 mg  0.4 mg Sublingual Q5 min PRN Lorretta Harp, MD      . oxyCODONE (Oxy IR/ROXICODONE) immediate release tablet 5 mg  5 mg Oral TID PRN Lorretta Harp, MD      . pantoprazole (PROTONIX) EC tablet 40 mg  40 mg Oral Daily Lorretta Harp, MD   40 mg at 05/15/13 1059  . traZODone (DESYREL) tablet 150 mg  150 mg Oral QHS Lorretta Harp, MD   150 mg at 05/14/13 2251    PE: General appearance: alert, cooperative and no distress Lungs: clear to auscultation bilaterally Heart: regular rate and  rhythm Extremities: no LEE Pulses: 2+ and symmetric Skin: warm and dry Neurologic: Grossly normal  Lab Results:   Recent Labs  05/14/13 0825 05/14/13 0834  WBC 11.8*  --   HGB 13.8 14.3  HCT 39.4 42.0  PLT 274  --    BMET  Recent Labs  05/14/13 0825 05/14/13 0834  NA 140 139  K 3.6 3.5  CL 102 102  CO2 27  --   GLUCOSE 110* 113*  BUN 16 15  CREATININE 0.78 0.80  CALCIUM 9.2  --    PT/INR  Recent Labs  05/14/13 0825  LABPROT 13.2  INR 1.02   Cholesterol  Recent Labs  05/15/13 0155  CHOL 168   Cardiac Panel (last 3 results)  Recent Labs  05/14/13 0825 05/14/13 1930 05/15/13 0155 05/15/13 0715  CKTOTAL  --  81  --   --   CKMB  --  2.0  --   --   TROPONINI 0.78* 0.58* 0.71* 0.67*  RELINDX  --  RELATIVE INDEX IS INVALID  --   --       Assessment/Plan  Principal Problem:   Acute ischemic stroke Active Problems:   DIABETES MELLITUS, TYPE I  HYPERTENSION   Esophageal reflux   BILIARY CIRRHOSIS, PRIMARY   Elevated troponin I level   Altered mental status  Plan: Troponin trending down. She continues to deny any chest pain or SOB. TEE is scheduled for Friday at 11:00 am with Dr. Melburn Popper. Orders have been placed. She will be NPO starting midnight Friday. Will also plan for NST on Saturday.      LOS: 1 day    Brittainy M. Sharol Harness, PA-C 05/15/2013 2:08 PM  Stable overall from cardiac standpoint - Echo was relatively normal. Neurology has recommended TEE.  As she will likely be here over the weekend, may as well do Myoview prior to d/c.  We will be available for assistance & perform Myoview on Saturday.  Marykay Lex, MD

## 2013-05-15 NOTE — Progress Notes (Signed)
Speech Language Pathology Treatment: Dysphagia  Patient Details Name: Diamond Miller MRN: [id number] DOB: [birth year] Today's Date: 05/15/2013 Time: 0815-0829 SLP Time Calculation (min): 14 min  Assessment / Plan / Recommendation Clinical Impression  Pt seen for skilled dysphagia treatment, pt eating breakfast alone and reports difficulty masticating food due to lower dentures being at home.  Upper denture in place however and use of pureed (yogurt) and liquids aided oral clearance.  Pt consumed eggs, sausage, yogurt and orange juice with SLP in room.  Delayed oral manipulation/transiting observed no clinical indications of airway compromise with po.   Educated pt to need to consume liquids/pureed to clear oral cavity and provided pt with written copy of precaution sign.  Also wrote note requesting family to bring dentures for pt.   Pt will benefit from intermittent supervision during meals to encourage her to eat, as her initiation appeared impaired during session.    Perseveration on sister going to church today noted and pt detailing events leading up to hospitalization.  Pt required frequent redirection during session.  She was able to verbalize and locate call bell for assist.  Speech evaluation to be conducted.      HPI HPI: 79 year old with PMH of primary biliary cirrhosis, hypertension, no history of diabetes, + GERD, who presents with a slurry speech and altered mental status. CT head showed possible ischemic stroke left basal ganglia.  Pt failed RN stroke swallow screen in Ed and underwent BSE by SLP placed on dys2/thin diet.  Pt to be seen today for dysphagia tx and speech evaluation.    Pertinent Vitals Afebrile, decreased  SLP Plan  Continue with current plan of care    Recommendations Diet recommendations: Dysphagia 2 (fine chop);Thin liquid Liquids provided via: Cup;Straw Medication Administration: Whole meds with liquid Supervision: Patient able to self feed;Full  supervision/cueing for compensatory strategies Compensations: Slow rate;Small sips/bites;Check for pocketing Postural Changes and/or Swallow Maneuvers: Seated upright 90 degrees;Upright 30-60 min after meal              Oral Care Recommendations: Oral care BID Follow up Recommendations: Inpatient Rehab Plan: Continue with current plan of care    GO     Donavan Burnet, MS [facility] SLP [phone]

## 2013-05-15 NOTE — Progress Notes (Signed)
UR complete.  Zaiden Ludlum RN, MSN 

## 2013-05-15 NOTE — Progress Notes (Signed)
Stroke Team Progress Note  HISTORY Diamond Miller is an 79 y.o. female patient who lives alone in a senior apartment in [place]. At her baseline, she can use her walker to walk slowly at home. She talks to sister almost every day on the phone. The patient was last known normal at 3:30 PM 05/13/2013 when her sister talked to her on the phone. At about 6:10 in the morning on 05/14/2013, patient called her sister telling that she may have had a stroke. She told her sister that she felt numb in her hand (did not say which hand). Speech was fairly reasonable at that time. Then, her sister went to the patient's home. Because patient's apartment was locked inside, she called 911 and found pt lying on the floor next to couch with AMS and slurred speech. Blood pressure was elevated at 210/160 mmHg. Patient was not a TPA candidate secondary to delay in arrival. She was admitted for further evaluation and treatment.  SUBJECTIVE Her sister is at the bedside.  Overall she feels her condition is gradually improving. She wants to go home. She does not remember events of yesterday.  OBJECTIVE Most recent Vital Signs: Filed Vitals:   05/15/13 0151 05/15/13 0400 05/15/13 0600 05/15/13 1037  BP: 141/66 155/58 148/86 152/72  Pulse: 71 77 74 77  Temp: 98.1 F (36.7 C) 97.8 F (36.6 C) 97.9 F (36.6 C) 97.6 F (36.4 C)  TempSrc: Oral Oral Oral Oral  Resp: 20 20 20 20   Height:      Weight:      SpO2: 99% 94% 98% 96%   CBG (last 3)  No results found for this basename: GLUCAP,  in the last 72 hours  IV Fluid Intake:     MEDICATIONS  . atorvastatin  40 mg Oral q1800  . heparin  5,000 Units Subcutaneous Q8H  . metoprolol succinate  50 mg Oral Daily  . multivitamin with minerals  1 tablet Oral Daily  . pantoprazole  40 mg Oral Daily  . traZODone  150 mg Oral QHS   PRN:  diazepam, morphine injection, nitroGLYCERIN, oxyCODONE  Diet:  Dysphagia 2 thin liquids Activity:  Bedrest with BRP DVT  Prophylaxis:  Heparin 5000 units sq tid   CLINICALLY SIGNIFICANT STUDIES Basic Metabolic Panel:   Recent Labs Lab 05/14/13 0825 05/14/13 0834  NA 140 139  K 3.6 3.5  CL 102 102  CO2 27  --   GLUCOSE 110* 113*  BUN 16 15  CREATININE 0.78 0.80  CALCIUM 9.2  --    Liver Function Tests:   Recent Labs Lab 05/14/13 0825  AST 30  ALT 15  ALKPHOS 140*  BILITOT 0.4  PROT 6.9  ALBUMIN 3.2*   CBC:   Recent Labs Lab 05/14/13 0825 05/14/13 0834  WBC 11.8*  --   NEUTROABS 9.3*  --   HGB 13.8 14.3  HCT 39.4 42.0  MCV 86.6  --   PLT 274  --    Coagulation:   Recent Labs Lab 05/14/13 0825  LABPROT 13.2  INR 1.02   Cardiac Enzymes:   Recent Labs Lab 05/14/13 0825 05/14/13 1930 05/15/13 0155 05/15/13 0715  CKTOTAL  --  81  --   --   CKMB  --  2.0  --   --   TROPONINI 0.78* 0.58* 0.71* 0.67*   Urinalysis:   Recent Labs Lab 05/14/13 0846  COLORURINE YELLOW  LABSPEC 1.011  PHURINE 7.0  GLUCOSEU NEGATIVE  HGBUR NEGATIVE  BILIRUBINUR  NEGATIVE  KETONESUR NEGATIVE  PROTEINUR NEGATIVE  UROBILINOGEN 0.2  NITRITE NEGATIVE  LEUKOCYTESUR NEGATIVE   Lipid Panel    Component Value Date/Time   CHOL 168 05/15/2013 0155   TRIG 123 05/15/2013 0155   HDL 38* 05/15/2013 0155   CHOLHDL 4.4 05/15/2013 0155   VLDL 25 05/15/2013 0155   LDLCALC 105* 05/15/2013 0155   HgbA1C  Lab Results  Component Value Date   HGBA1C 5.4 05/14/2013    Urine Drug Screen:   No results found for this basename: labopia,  cocainscrnur,  labbenz,  amphetmu,  thcu,  labbarb    Alcohol Level: No results found for this basename: ETH,  in the last 168 hours  CT of the brain  05/14/2013  No intracranial hemorrhage, mass effect or midline shift. Mild cerebral atrophy. There is probable old infarct with mild encephalomalacia in right frontal lobe. There is decreased attenuation in left basal ganglia measures about 1.3 cm. This is highly suspicious for evolving infarct/ischemia.   MRI of  the brain  05/14/2013   Small to moderate size acute infarct involving portions of the left operculum region/ subinsular region, posterior aspect of the left internal capsule, posterior left coronal radiata and left frontal lobe. Small amount of hemorrhage within the posterior left lenticular nucleus acute infarct.  Remote right operculum region infarct extending to the right corona radiata with encephalomalacia.  Remote small right cerebellar infarcts.  Mild small vessel disease type changes.  Global atrophy without hydrocephalus.  Partial opacification left sphenoid sinus air cell.  MRA of the brain  05/14/2013     Intracranial atherosclerotic type changes  2D Echocardiogram  No significant heart disease. The  etiology of the patient's symptoms is still not clear. The contrast study was inadequate to rule out shunt. No cardiac source of embolism was identified, but cannot be ruled out on the basis of this examination.  Carotid Doppler  Preliminary report: Bilateral: 1-39% ICA stenosis. Vertebral artery flow is antegrade.   CXR    EKG  Sinus rhythm. Borderline left axis deviation. No significant change since last tracing.   Therapy Recommendations SNF  Physical Exam   Obese elderly Caucasian lady currently not in distress.Awake alert. Afebrile. Head is nontraumatic. Neck is supple without bruit. Hearing is normal. Cardiac exam no murmur or gallop. Lungs are clear to auscultation. Distal pulses are well felt. Neurological Exam :  Awake alert oriented x2. Diminished short-term memory, registration and recall. Follows simple one and two-step commands. Extraocular movements full range without nystagmus. Blinks to threat bilaterally. Vision acuity seems adequate. Fundi were not visualized. Face is symmetric without weakness. Tongue is midline. Motor system exam no upper or lower extremity drift. Symmetric and equal strength in all 4 extremities. Mild subjective decreased sensation in the right arm only.  Gait is broad-based and slightly unsteady. ASSESSMENT [name] is a 79 y.o. female presenting with numbness, altered mental status and slurred speech. Imaging confirms a left operculum region/ subinsular region, posterior aspect of the left internal capsule, posterior left coronal radiata and left frontal lobe infarct with a small amount of petechial hemorrhage within the posterior left lenticular nucleus acute infarct.  Acute strokes in setting of old right opercular/right corona radiata infarct with encephalomalacia along with old small right cerebellar infarcts. Infarcts felt to be embolic secondary to unknown source.  On no antithrombotics prior to admission. Now on aspirin 300 mg suppository daily for secondary stroke prevention. Patient with resultant symptoms that have resolved. Work up  underway.  Hypertension Hyperlipidemia, LDL 105, on no statin PTA, now on pravachol 40 mg daily, goal LDL < 100 (< 70 for diabetics) Elevated troponins Alkaline phosphotase 140  Hospital day # 1  TREATMENT/PLAN  Change aspirin to aspirin 325 mg orally every day for secondary stroke prevention.  OOB with assistance TEE to look for embolic source. Please arrange with pts cardiologist or cardiologist of choice. If positive for PFO (patent foramen ovale), check bilateral lower extremity venous dopplers to rule out DVT as possible source of stroke.  Will not consider a loop on this patient  Dr. Pearlean Brownie discussed diagnosis, prognosis,  treatment options and plan of care with medical resident.    Annie Main, MSN, RN, ANVP-BC, ANP-BC, [facility] Pager: [phone] 05/15/2013 10:55 AM  I have personally obtained a history, examined the patient, evaluated imaging results, and formulated the assessment and plan of care. I agree with the above. Delia Heady, MD

## 2013-05-16 DIAGNOSIS — R1310 Dysphagia, unspecified: Secondary | ICD-10-CM

## 2013-05-16 MED ORDER — SODIUM CHLORIDE 0.9 % IV SOLN
INTRAVENOUS | Status: DC
  Administered 2013-05-17: 02:00:00 via INTRAVENOUS
  Administered 2013-05-17: 500 mL via INTRAVENOUS

## 2013-05-16 NOTE — Progress Notes (Signed)
Internal Medicine Attending  Date: 05/16/2013  Patient name: Diamond Miller Medical record number: [id number] Date of birth: [birth year] Age: 79 y.o. Gender: female  I saw and evaluated the patient on a.m. rounds, and I discussed her care with house staff. I reviewed the resident's note by Dr. Aundria Rud and I agree with the resident's findings and plans as documented in her note.

## 2013-05-16 NOTE — Progress Notes (Signed)
Stroke Team Progress Note  HISTORY Diamond Miller is an 79 y.o. female patient who lives alone in a senior apartment in [place]. At her baseline, she can use her walker to walk slowly at home. She talks to sister almost every day on the phone. The patient was last known normal at 3:30 PM 05/13/2013 when her sister talked to her on the phone. At about 6:10 in the morning on 05/14/2013, patient called her sister telling that she may have had a stroke. She told her sister that she felt numb in her hand (did not say which hand). Speech was fairly reasonable at that time. Then, her sister went to the patient's home. Because patient's apartment was locked inside, she called 911 and found pt lying on the floor next to couch with AMS and slurred speech. Blood pressure was elevated at 210/160 mmHg. Patient was not a TPA candidate secondary to delay in arrival. She was admitted for further evaluation and treatment.  SUBJECTIVE No family at bedside.  OBJECTIVE Most recent Vital Signs: Filed Vitals:   05/15/13 2113 05/16/13 0200 05/16/13 0544 05/16/13 0947  BP: 153/81 148/65 157/75 144/85  Pulse: 78 73 74 83  Temp: 97.9 F (36.6 C) 98 F (36.7 C) 98.6 F (37 C) 97.7 F (36.5 C)  TempSrc: Oral Oral Axillary Oral  Resp: 20 20 18 18   Height:      Weight:      SpO2: 96% 98% 91% 96%   CBG (last 3)  No results found for this basename: GLUCAP,  in the last 72 hours  IV Fluid Intake:     MEDICATIONS  . atorvastatin  40 mg Oral q1800  . clopidogrel  75 mg Oral Q breakfast  . heparin  5,000 Units Subcutaneous Q8H  . metoprolol succinate  50 mg Oral Daily  . multivitamin with minerals  1 tablet Oral Daily  . pantoprazole  40 mg Oral Daily  . traZODone  150 mg Oral QHS   PRN:  diazepam, morphine injection, nitroGLYCERIN, oxyCODONE  Diet:  Dysphagia 2 thin liquids Activity:  Up with assistance DVT Prophylaxis:  Heparin 5000 units sq tid   CLINICALLY SIGNIFICANT STUDIES Basic Metabolic  Panel:   Recent Labs Lab 05/14/13 0825 05/14/13 0834  NA 140 139  K 3.6 3.5  CL 102 102  CO2 27  --   GLUCOSE 110* 113*  BUN 16 15  CREATININE 0.78 0.80  CALCIUM 9.2  --    Liver Function Tests:   Recent Labs Lab 05/14/13 0825  AST 30  ALT 15  ALKPHOS 140*  BILITOT 0.4  PROT 6.9  ALBUMIN 3.2*   CBC:   Recent Labs Lab 05/14/13 0825 05/14/13 0834  WBC 11.8*  --   NEUTROABS 9.3*  --   HGB 13.8 14.3  HCT 39.4 42.0  MCV 86.6  --   PLT 274  --    Coagulation:   Recent Labs Lab 05/14/13 0825  LABPROT 13.2  INR 1.02   Cardiac Enzymes:   Recent Labs Lab 05/14/13 0825 05/14/13 1930 05/15/13 0155 05/15/13 0715 05/15/13 1304  CKTOTAL  --  81  --   --   --   CKMB  --  2.0  --   --   --   TROPONINI 0.78* 0.58* 0.71* 0.67* 0.64*   Urinalysis:   Recent Labs Lab 05/14/13 0846  COLORURINE YELLOW  LABSPEC 1.011  PHURINE 7.0  GLUCOSEU NEGATIVE  HGBUR NEGATIVE  BILIRUBINUR NEGATIVE  KETONESUR NEGATIVE  PROTEINUR NEGATIVE  UROBILINOGEN 0.2  NITRITE NEGATIVE  LEUKOCYTESUR NEGATIVE   Lipid Panel    Component Value Date/Time   CHOL 168 05/15/2013 0155   TRIG 123 05/15/2013 0155   HDL 38* 05/15/2013 0155   CHOLHDL 4.4 05/15/2013 0155   VLDL 25 05/15/2013 0155   LDLCALC 105* 05/15/2013 0155   HgbA1C  Lab Results  Component Value Date   HGBA1C 5.4 05/14/2013    Urine Drug Screen:   No results found for this basename: labopia,  cocainscrnur,  labbenz,  amphetmu,  thcu,  labbarb    Alcohol Level: No results found for this basename: ETH,  in the last 168 hours  CT of the brain  05/14/2013  No intracranial hemorrhage, mass effect or midline shift. Mild cerebral atrophy. There is probable old infarct with mild encephalomalacia in right frontal lobe. There is decreased attenuation in left basal ganglia measures about 1.3 cm. This is highly suspicious for evolving infarct/ischemia.   MRI of the brain  05/14/2013   Small to moderate size acute infarct  involving portions of the left operculum region/ subinsular region, posterior aspect of the left internal capsule, posterior left coronal radiata and left frontal lobe. Small amount of hemorrhage within the posterior left lenticular nucleus acute infarct.  Remote right operculum region infarct extending to the right corona radiata with encephalomalacia.  Remote small right cerebellar infarcts.  Mild small vessel disease type changes.  Global atrophy without hydrocephalus.  Partial opacification left sphenoid sinus air cell.  MRA of the brain  05/14/2013     Intracranial atherosclerotic type changes  2D Echocardiogram  No significant heart disease. The  etiology of the patient's symptoms is still not clear. The contrast study was inadequate to rule out shunt. No cardiac source of embolism was identified, but cannot be ruled out on the basis of this examination.  Carotid Doppler  Preliminary report: Bilateral: 1-39% ICA stenosis. Vertebral artery flow is antegrade.     EKG  Sinus rhythm. Borderline left axis deviation. No significant change since last tracing.   Therapy Recommendations SNF  Physical Exam   Obese elderly Caucasian lady currently not in distress.Awake alert. Afebrile. Head is nontraumatic. Neck is supple without bruit. Hearing is normal. Cardiac exam no murmur or gallop. Lungs are clear to auscultation. Distal pulses are well felt. Neurological Exam :  Awake alert oriented x2. Diminished short-term memory, registration and recall. Follows simple one and two-step commands. Extraocular movements full range without nystagmus. Blinks to threat bilaterally. Vision acuity seems adequate. Fundi were not visualized. Face is symmetric without weakness. Tongue is midline. Motor system exam no upper or lower extremity drift. Symmetric and equal strength in all 4 extremities. Mild subjective decreased sensation in the right arm only. Gait is broad-based and slightly unsteady.  ASSESSMENT Diamond Miller is a 79 y.o. female presenting with numbness, altered mental status and slurred speech. Imaging confirms a left operculum region/ subinsular region, posterior aspect of the left internal capsule, posterior left coronal radiata and left frontal lobe infarct with a small amount of petechial hemorrhage within the posterior left lenticular nucleus acute infarct.  Acute strokes in setting of old right opercular/right corona radiata infarct with encephalomalacia along with old small right cerebellar infarcts. Infarcts felt to be embolic secondary to unknown source.  On no antithrombotics prior to admission. Now on clopidogrel 75 mg orally every day for secondary stroke prevention (history of primary biliary cirrhosis and was advised not to take aspirin by her  gastroenterologist). Patient with resultant symptoms that have resolved. Work up underway.  Hypertension Hyperlipidemia, LDL 105, on no statin PTA, now on pravachol 40 mg daily, goal LDL < 100 (< 70 for diabetics) Elevated troponins, trending down. Cardiology following. Plan myoview prior to discharge. Alkaline phosphotase 140  Hospital day # 2  TREATMENT/PLAN  Continue  clopidogrel 75 mg orally every day for secondary stroke prevention. TEE tomorrow look for embolic source. If positive for PFO (patent foramen ovale), check bilateral lower extremity venous dopplers to rule out DVT as possible source of stroke.  Will not consider a loop on this patient SNF recommended by therapy at discharge  Dr. Pearlean Brownie discussed diagnosis, prognosis,  treatment options and plan of care with patient.    Annie Main, MSN, RN, ANVP-BC, ANP-BC, [facility] Pager: [phone] 05/16/2013 10:53 AM  I have personally obtained a history, examined the patient, evaluated imaging results, and formulated the assessment and plan of care. I agree with the above.  Delia Heady, MD

## 2013-05-16 NOTE — Progress Notes (Signed)
Subjective: [name] is doing well this morning, eating well.  Wants to try to ambulate with PT today.    Objective: Vital signs in last 24 hours: Filed Vitals:   05/15/13 1824 05/15/13 2113 05/16/13 0200 05/16/13 0544  BP: 128/81 153/81 148/65 157/75  Pulse: 82 78 73 74  Temp: 98.3 F (36.8 C) 97.9 F (36.6 C) 98 F (36.7 C) 98.6 F (37 C)  TempSrc: Oral Oral Oral Axillary  Resp: 20 20 20 18   Height:      Weight:      SpO2: 98% 96% 98% 91%   Weight change:   Intake/Output Summary (Last 24 hours) at 05/16/13 0729 Last data filed at 05/16/13 0656  Gross per 24 hour  Intake      0 ml  Output      5 ml  Net     -5 ml   PEX General: alert, cooperative, NAD HEENT: NCAT, sclerae anicteric  Neck: supple, no lymphadenopathy Lungs: clear to ascultation bilaterally, normal work of respiration, no wheezes, rales, ronchi Heart: regular rate and rhythm, no murmurs, gallops, or rubs Abdomen: soft, non-tender, non-distended, normal bowel sounds Extremities: 2+ DP/PT pulses bilaterally, no cyanosis, clubbing, or edema Neurologic: alert & oriented X3, cranial nerves II-XII intact, strength grossly intact, sensation intact to light touch; no slurred speech, no facial droop appreciated  Lab Results: Basic Metabolic Panel:  Recent Labs Lab 05/14/13 0825 05/14/13 0834  NA 140 139  K 3.6 3.5  CL 102 102  CO2 27  --   GLUCOSE 110* 113*  BUN 16 15  CREATININE 0.78 0.80  CALCIUM 9.2  --    Liver Function Tests:  Recent Labs Lab 05/14/13 0825  AST 30  ALT 15  ALKPHOS 140*  BILITOT 0.4  PROT 6.9  ALBUMIN 3.2*   CBC:  Recent Labs Lab 05/14/13 0825 05/14/13 0834  WBC 11.8*  --   NEUTROABS 9.3*  --   HGB 13.8 14.3  HCT 39.4 42.0  MCV 86.6  --   PLT 274  --    Cardiac Enzymes:  Recent Labs Lab 05/14/13 0825 05/14/13 1930 05/15/13 0155 05/15/13 0715 05/15/13 1304  CKTOTAL  --  81  --   --   --   CKMB  --  2.0  --   --   --   TROPONINI 0.78* 0.58*  0.71* 0.67* 0.64*   Hemoglobin A1C:  Recent Labs Lab 05/14/13 1930  HGBA1C 5.4   Fasting Lipid Panel:  Recent Labs Lab 05/15/13 0155  CHOL 168  HDL 38*  LDLCALC 105*  TRIG 123  CHOLHDL 4.4   Thyroid Function Tests:  Recent Labs Lab 05/14/13 1930  TSH 0.354   Coagulation:  Recent Labs Lab 05/14/13 0825  LABPROT 13.2  INR 1.02   Urinalysis:  Recent Labs Lab 05/14/13 0846  COLORURINE YELLOW  LABSPEC 1.011  PHURINE 7.0  GLUCOSEU NEGATIVE  HGBUR NEGATIVE  BILIRUBINUR NEGATIVE  KETONESUR NEGATIVE  PROTEINUR NEGATIVE  UROBILINOGEN 0.2  NITRITE NEGATIVE  LEUKOCYTESUR NEGATIVE   Studies/Results: Ct Head Wo Contrast  05/14/2013   CLINICAL DATA:  Slurred speech, found on the floor  EXAM: CT HEAD WITHOUT CONTRAST  TECHNIQUE: Contiguous axial images were obtained from the base of the skull through the vertex without intravenous contrast.  COMPARISON:  None.  FINDINGS: No skull fracture is noted. There is mucosal thickening with air-fluid level left sphenoid sinus. The mastoid air cells are unremarkable. No intracranial hemorrhage, mass effect or midline  shift. Mild cerebral atrophy. There is probable old infarct with mild encephalomalacia in right frontal lobe. There is decreased attenuation in left basal ganglia measures about 1.3 cm. This is highly suspicious for evolving infarct/ischemia. Best seen in axial image 17. Further evaluation with MRI with diffusion imaging is recommended. No mass lesion is noted on this unenhanced scan.  IMPRESSION: No intracranial hemorrhage, mass effect or midline shift. Mild cerebral atrophy. There is probable old infarct with mild encephalomalacia in right frontal lobe. There is decreased attenuation in left basal ganglia measures about 1.3 cm. This is highly suspicious for evolving infarct/ischemia. Best seen in axial image 17. Further evaluation with MRI with diffusion imaging is recommended.  These results were called by telephone at the  time of interpretation on 05/14/2013 at 9:44 AM to Dr. Cathren Laine , who verbally acknowledged these results.   Electronically Signed   By: Natasha Mead M.D.   On: 05/14/2013 09:45   Mr Maxine Glenn Head Wo Contrast  05/14/2013   CLINICAL DATA:  Difficulty with speech. Numbness in hands. Cirrhosis. Hypertension.  EXAM: MRI HEAD WITHOUT CONTRAST  MRA HEAD WITHOUT CONTRAST  TECHNIQUE: Multiplanar, multiecho pulse sequences of the brain and surrounding structures were obtained without intravenous contrast. Angiographic images of the head were obtained using MRA technique without contrast.  COMPARISON:  05/14/2013 head CT.  FINDINGS: MRI HEAD FINDINGS  Small to moderate size acute infarct involving portions of the left operculum region/ subinsular region, posterior aspect of the left internal capsule, posterior left coronal radiata and left frontal lobe. Small amount of hemorrhage within the posterior left lenticular nucleus acute infarct.  Remote right operculum region infarct extending to the right corona radiata with encephalomalacia.  Remote small right cerebellar infarcts.  Mild small vessel disease type changes.  Global atrophy without hydrocephalus.  No intracranial mass lesion noted on this unenhanced exam.  Mild cervical spondylotic changes. Cervical medullary junction, pituitary region, pineal region and orbital structures unremarkable.  Partial opacification left sphenoid sinus air cell.  MRA HEAD FINDINGS  M1 segment right middle cerebral artery mild to slightly moderate narrowing.  Middle cerebral artery branch vessel narrowing, irregularity and areas of occlusion bilaterally more notable on the left.  Left vertebral artery ends in a posterior inferior cerebellar artery distribution with vessels narrowed and irregular.  Right narrowing of the distal right vertebral artery.  Moderate to marked narrowing proximal to mid basilar artery.  Nonvisualization the anterior inferior cerebellar arteries.  Moderate to marked  narrowing involving portions of the superior cerebellar arteries and posterior cerebral arteries bilaterally most notable involving the right posterior cerebral artery.  No aneurysm identified.  Fetal contribution to the posterior cerebral arteries.  IMPRESSION: Small to moderate size acute infarct involving portions of the left operculum region/ subinsular region, posterior aspect of the left internal capsule, posterior left coronal radiata and left frontal lobe. Small amount of hemorrhage within the posterior left lenticular nucleus acute infarct.  Remote right operculum region infarct extending to the right corona radiata with encephalomalacia.  Remote small right cerebellar infarcts.  Mild small vessel disease type changes.  Global atrophy without hydrocephalus.  Partial opacification left sphenoid sinus air cell.  Intracranial atherosclerotic type changes as detailed above.  These results will be called to the ordering clinician or representative by the Radiologist Assistant, and communication documented in the PACS Dashboard.   Electronically Signed   By: Bridgett Larsson M.D.   On: 05/14/2013 17:31   Mr Brain Wo Contrast  05/14/2013  CLINICAL DATA:  Difficulty with speech. Numbness in hands. Cirrhosis. Hypertension.  EXAM: MRI HEAD WITHOUT CONTRAST  MRA HEAD WITHOUT CONTRAST  TECHNIQUE: Multiplanar, multiecho pulse sequences of the brain and surrounding structures were obtained without intravenous contrast. Angiographic images of the head were obtained using MRA technique without contrast.  COMPARISON:  05/14/2013 head CT.  FINDINGS: MRI HEAD FINDINGS  Small to moderate size acute infarct involving portions of the left operculum region/ subinsular region, posterior aspect of the left internal capsule, posterior left coronal radiata and left frontal lobe. Small amount of hemorrhage within the posterior left lenticular nucleus acute infarct.  Remote right operculum region infarct extending to the right corona  radiata with encephalomalacia.  Remote small right cerebellar infarcts.  Mild small vessel disease type changes.  Global atrophy without hydrocephalus.  No intracranial mass lesion noted on this unenhanced exam.  Mild cervical spondylotic changes. Cervical medullary junction, pituitary region, pineal region and orbital structures unremarkable.  Partial opacification left sphenoid sinus air cell.  MRA HEAD FINDINGS  M1 segment right middle cerebral artery mild to slightly moderate narrowing.  Middle cerebral artery branch vessel narrowing, irregularity and areas of occlusion bilaterally more notable on the left.  Left vertebral artery ends in a posterior inferior cerebellar artery distribution with vessels narrowed and irregular.  Right narrowing of the distal right vertebral artery.  Moderate to marked narrowing proximal to mid basilar artery.  Nonvisualization the anterior inferior cerebellar arteries.  Moderate to marked narrowing involving portions of the superior cerebellar arteries and posterior cerebral arteries bilaterally most notable involving the right posterior cerebral artery.  No aneurysm identified.  Fetal contribution to the posterior cerebral arteries.  IMPRESSION: Small to moderate size acute infarct involving portions of the left operculum region/ subinsular region, posterior aspect of the left internal capsule, posterior left coronal radiata and left frontal lobe. Small amount of hemorrhage within the posterior left lenticular nucleus acute infarct.  Remote right operculum region infarct extending to the right corona radiata with encephalomalacia.  Remote small right cerebellar infarcts.  Mild small vessel disease type changes.  Global atrophy without hydrocephalus.  Partial opacification left sphenoid sinus air cell.  Intracranial atherosclerotic type changes as detailed above.  These results will be called to the ordering clinician or representative by the Radiologist Assistant, and  communication documented in the PACS Dashboard.   Electronically Signed   By: Bridgett Larsson M.D.   On: 05/14/2013 17:31   Medications: I have reviewed the patient's current medications. Scheduled Meds: . atorvastatin  40 mg Oral q1800  . clopidogrel  75 mg Oral Q breakfast  . heparin  5,000 Units Subcutaneous Q8H  . metoprolol succinate  50 mg Oral Daily  . multivitamin with minerals  1 tablet Oral Daily  . pantoprazole  40 mg Oral Daily  . traZODone  150 mg Oral QHS   PRN Meds:.diazepam, morphine injection, nitroGLYCERIN, oxyCODONE Assessment/Plan: #Stroke: Patient presents with acute onset of slurred speech and right hand numbness. CT head, MRI/MRA brain results above.  She presented outside window for tPA.  Patient has risk factors for stroke, including age and hypertension.  Patient has history of primary biliary cirrhosis and was advised not to take aspirin by her gastroenterologist.   Stroke workup thus far: Bilateral carotid dopplers negative.  Echo with EF 65-70%, normal systolic function, no wall motion abnormalities.  Risk factor stratification: A1C 5.4%, LDL 105, TSH within normal limits.  - neurology consult, appreciate recs  - dysphagia 2 diet per SLP  -  Lipitor 40 mg daily - Plavix 75 mg daily (patient unwilling to take ASA due to recommendations by GI) - TEE tomorrow 12/26 at 11a with Dr. Melburn Popper to rule-out PFO per neurology - PT/OT consults, recommending SNF placement but patient not sure if she is willing to go   #Elevated troponin: Trop 0.78 on admission --> 0.58 --> 0.71 --> 0.67 --> 0.64 this afternoon.  Cardiology feels confident that elevation due to demand in setting of stroke.  Patient did not have history of MI.  EKG has no significant change compared to previous one on 10/18/11. She is on metoprolol 50 mg daily at home.   - Cardiology consult, appreciate recs - continue metoprolol 50 mg daily, Lipitor 40 mg daily - TEE per above on Friday  - NST on Saturday    #HTN: BP stable at 157/75 today. On metoprolol 50 mg daily at home.  - permissive hypertension but continue metoprolol given demand ischemia  #GERD: on Nexium at home - continue PPI   #Primary biliary cirrhosis: 04/2010 liver biopsy was highly suggestive of PBC at early-stage.  Per Dr. Collier Bullock note, patient had negative anti-smooth muscle antibody and ANA homogenous + 1:320 titer.  She was treated with trial of Urso forte which resulted in dramatic diarrhea so she is not taking any medications currently.  LFTs on admission: AST 30, ALT 15, bilirubin 0.4, albumin 3.2, protein 6.9, and ALP 140.   #DVT PPX: Heparin subq TID   Dispo: Disposition is deferred at this time, awaiting improvement of current medical problems.  Anticipated discharge in approximately 2-4 day(s).   The patient does have a current PCP Lorie Phenix, MD) and does need an [facility] hospital follow-up appointment after discharge.   .Services Needed at time of discharge: Y = Yes, Blank = No PT:   OT:   RN:   Equipment:   Other:     LOS: 2 days   Rocco Serene, MD 05/16/2013, 7:29 AM

## 2013-05-17 ENCOUNTER — Encounter (HOSPITAL_COMMUNITY)
Admission: EM | Disposition: A | Discharge status: Home Health | Payer: Self-pay | Source: Home / Self Care | Attending: Internal Medicine

## 2013-05-17 ENCOUNTER — Encounter (HOSPITAL_COMMUNITY): Payer: Self-pay | Admitting: *Deleted

## 2013-05-17 DIAGNOSIS — I059 Rheumatic mitral valve disease, unspecified: Secondary | ICD-10-CM

## 2013-05-17 HISTORY — PX: TEE WITHOUT CARDIOVERSION: SHX5443

## 2013-05-17 SURGERY — ECHOCARDIOGRAM, TRANSESOPHAGEAL
Anesthesia: Moderate Sedation

## 2013-05-17 MED ORDER — MIDAZOLAM HCL 5 MG/ML IJ SOLN
INTRAMUSCULAR | Status: AC
  Filled 2013-05-17: qty 2

## 2013-05-17 MED ORDER — FENTANYL CITRATE 0.05 MG/ML IJ SOLN
INTRAMUSCULAR | Status: DC | PRN
  Administered 2013-05-17 (×4): 25 ug via INTRAVENOUS

## 2013-05-17 MED ORDER — BUTAMBEN-TETRACAINE-BENZOCAINE 2-2-14 % EX AERO
INHALATION_SPRAY | CUTANEOUS | Status: DC | PRN
  Administered 2013-05-17: 2 via TOPICAL

## 2013-05-17 MED ORDER — MIDAZOLAM HCL 10 MG/2ML IJ SOLN
INTRAMUSCULAR | Status: DC | PRN
  Administered 2013-05-17: 2 mg via INTRAVENOUS
  Administered 2013-05-17: 3 mg via INTRAVENOUS
  Administered 2013-05-17: 2 mg via INTRAVENOUS
  Administered 2013-05-17: 1 mg via INTRAVENOUS

## 2013-05-17 MED ORDER — FENTANYL CITRATE 0.05 MG/ML IJ SOLN
INTRAMUSCULAR | Status: AC
  Filled 2013-05-17: qty 2

## 2013-05-17 NOTE — H&P (View-Only) (Signed)
 Subjective: Diamond Miller is doing well this morning, no complaints.  NPO in preparation for TEE.   Objective: Vital signs in last 24 hours: Filed Vitals:   05/16/13 1446 05/16/13 2022 05/17/13 0154 05/17/13 0609  BP: 143/79 162/85 169/85 154/64  Pulse: 84 80 69 69  Temp: 97.1 F (36.2 C) 98.1 F (36.7 C) 97.5 F (36.4 C) 98.6 F (37 C)  TempSrc: Oral Oral Oral Oral  Resp: 16 16 19 20  Height:      Weight:      SpO2: 97% 96% 97% 96%   Weight change:   Intake/Output Summary (Last 24 hours) at 05/17/13 0822 Last data filed at 05/17/13 0805  Gross per 24 hour  Intake    687 ml  Output     13 ml  Net    674 ml   PEX General: alert, cooperative, NAD HEENT: NCAT, sclerae anicteric  Neck: supple, no lymphadenopathy Lungs: clear to ascultation bilaterally, normal work of respiration, no wheezes, rales, ronchi Heart: regular rate and rhythm, no murmurs, gallops, or rubs Abdomen: soft, non-tender, non-distended, normal bowel sounds Extremities: 2+ DP/PT pulses bilaterally, no cyanosis, clubbing, or edema Neurologic: alert & oriented X3, cranial nerves II-XII intact, strength grossly intact, sensation intact to light touch; no slurred speech, no facial droop appreciated  Lab Results: Basic Metabolic Panel:  Recent Labs Lab 05/14/13 0825 05/14/13 0834  NA 140 139  K 3.6 3.5  CL 102 102  CO2 27  --   GLUCOSE 110* 113*  BUN 16 15  CREATININE 0.78 0.80  CALCIUM 9.2  --    Liver Function Tests:  Recent Labs Lab 05/14/13 0825  AST 30  ALT 15  ALKPHOS 140*  BILITOT 0.4  PROT 6.9  ALBUMIN 3.2*   CBC:  Recent Labs Lab 05/14/13 0825 05/14/13 0834  WBC 11.8*  --   NEUTROABS 9.3*  --   HGB 13.8 14.3  HCT 39.4 42.0  MCV 86.6  --   PLT 274  --    Cardiac Enzymes:  Recent Labs Lab 05/14/13 0825 05/14/13 1930 05/15/13 0155 05/15/13 0715 05/15/13 1304  CKTOTAL  --  81  --   --   --   CKMB  --  2.0  --   --   --   TROPONINI 0.78* 0.58* 0.71* 0.67*  0.64*   Hemoglobin A1C:  Recent Labs Lab 05/14/13 1930  HGBA1C 5.4   Fasting Lipid Panel:  Recent Labs Lab 05/15/13 0155  CHOL 168  HDL 38*  LDLCALC 105*  TRIG 123  CHOLHDL 4.4   Thyroid Function Tests:  Recent Labs Lab 05/14/13 1930  TSH 0.354   Coagulation:  Recent Labs Lab 05/14/13 0825  LABPROT 13.2  INR 1.02   Urinalysis:  Recent Labs Lab 05/14/13 0846  COLORURINE YELLOW  LABSPEC 1.011  PHURINE 7.0  GLUCOSEU NEGATIVE  HGBUR NEGATIVE  BILIRUBINUR NEGATIVE  KETONESUR NEGATIVE  PROTEINUR NEGATIVE  UROBILINOGEN 0.2  NITRITE NEGATIVE  LEUKOCYTESUR NEGATIVE    Medications: I have reviewed the patient's current medications. Scheduled Meds: . atorvastatin  40 mg Oral q1800  . clopidogrel  75 mg Oral Q breakfast  . heparin  5,000 Units Subcutaneous Q8H  . metoprolol succinate  50 mg Oral Daily  . multivitamin with minerals  1 tablet Oral Daily  . pantoprazole  40 mg Oral Daily  . traZODone  150 mg Oral QHS   PRN Meds:.diazepam, morphine injection, nitroGLYCERIN, oxyCODONE Assessment/Plan: #Stroke: Patient presents with acute onset   of slurred speech and right hand numbness. CT head, MRI/MRA brain results above.  She presented outside window for tPA.  Patient has risk factors for stroke, including age and hypertension.  Patient has history of primary biliary cirrhosis and was advised not to take aspirin by her gastroenterologist.   Stroke workup thus far: Bilateral carotid dopplers negative.  Echo with EF 65-70%, normal systolic function, no wall motion abnormalities.  Risk factor stratification: A1C 5.4%, LDL 105, TSH within normal limits.  - neurology consult, appreciate recs  - dysphagia 2 diet per SLP  - Lipitor 40 mg daily - Plavix 75 mg daily (patient unwilling to take ASA due to recommendations by GI) - TEE today at 11a with Dr. Nasher to rule-out PFO per neurology - PT/OT consults, recommending SNF placement but patient not sure if she is  willing to go; will likely have to evaluate patient's capacity to make this decision   #Elevated troponin: Trop 0.78 on admission --> 0.58 --> 0.71 --> 0.67 --> 0.64 this afternoon.  Cardiology feels confident that elevation due to demand in setting of stroke.  Patient did not have history of MI.  EKG has no significant change compared to previous one on 10/18/11. She is on metoprolol 50 mg daily at home.   - Cardiology consult, appreciate recs - continue metoprolol 50 mg daily, Lipitor 40 mg daily - TEE per above today - NST on Saturday 12/27  #HTN: BP stable at 157/75 today. On metoprolol 50 mg daily at home.  - continue metoprolol  #GERD: on Nexium at home - continue PPI   #Primary biliary cirrhosis: 04/2010 liver biopsy was highly suggestive of PBC at early-stage.  Per Dr. Jocbs's note, patient had negative anti-smooth muscle antibody and ANA homogenous + 1:320 titer.  She was treated with trial of Urso forte which resulted in dramatic diarrhea so she is not taking any medications currently.  LFTs on admission: AST 30, ALT 15, bilirubin 0.4, albumin 3.2, protein 6.9, and ALP 140.   #DVT PPX: Heparin subq TID   Dispo: Disposition is deferred at this time, awaiting improvement of current medical problems.  Anticipated discharge in approximately 1-3 day(s).   The patient does have a current PCP (Nancy Maloney, MD) and does need an [facility] hospital follow-up appointment after discharge.   .Services Needed at time of discharge: Y = Yes, Blank = No PT:   OT:   RN:   Equipment:   Other:     LOS: 3 days   Malaina Mortellaro, MD 05/17/2013, 8:22 AM  

## 2013-05-17 NOTE — Progress Notes (Signed)
  Echocardiogram Echocardiogram Transesophageal has been performed.  Diamond Miller 05/17/2013, 2:08 PM

## 2013-05-17 NOTE — CV Procedure (Signed)
    Transesophageal Echocardiogram Note  ABENA ERDMAN [id number] March 01, 1933  Procedure: Transesophageal Echocardiogram Indications: CVA  Procedure Details Consent: Obtained Time Out: Verified patient identification, verified procedure, site/side was marked, verified correct patient position, special equipment/implants available, Radiology Safety Procedures followed,  medications/allergies/relevent history reviewed, required imaging and test results available.  Performed  Medications: Fentanyl: 100 mcg iv Versed: 8 mg iv  Left Ventrical:  Normal LV function  Mitral Valve: mild MR  Aortic Valve: normal AV  Tricuspid Valve: normal TV  Pulmonic Valve: normal  Left Atrium/ Left atrial appendage: no thrombus,   Atrial septum: intact.  No right to left shunting by bubble study or color doppler  Aorta: mild calcification   Complications: No apparent complications Patient did tolerate procedure well.  She remained awake during the whole procedure.  She has a high tolerance to benzos and opiates.    Vesta Mixer, Montez Hageman., MD, [facility] 05/17/2013, 11:42 AM

## 2013-05-17 NOTE — Progress Notes (Signed)
Stroke Update:   TEE - no PFO, no thrombus, no source of embolus  Continue plavix 75 mg daily for secondary stroke prevention  SNF bed search underway  Would recommend OP tele monitoring to look for atrial fibrillation as possible source of her stroke   Patient has a 10-15% risk of having another stroke over the next year, the highest risk is within 2 weeks of the most recent stroke/TIA (risk of having a stroke following a stroke or TIA is the same).  Ongoing risk factor control by Primary Care Physician  Stroke Service will sign off. Please call should any needs arise.  Follow up with Dr. Pearlean Brownie, Stroke Clinic, in 2 months.  Annie Main, MSN, RN, ANVP-BC, ANP-BC, [facility] Pager: [phone] 05/17/2013 1:27 PM I have personally examined this patient, reviewed pertinent data, and developed plan of care and agree with above. Delia Heady, MD

## 2013-05-17 NOTE — Interval H&P Note (Signed)
History and Physical Interval Note:  05/17/2013 10:58 AM  Diamond Miller  has presented today for surgery, with the diagnosis of CDA  The various methods of treatment have been discussed with the patient and family. After consideration of risks, benefits and other options for treatment, the patient has consented to  Procedure(s): TRANSESOPHAGEAL ECHOCARDIOGRAM (TEE) (N/A) as a surgical intervention .  The patient's history has been reviewed, patient examined, no change in status, stable for surgery.  I have reviewed the patient's chart and labs.  Questions were answered to the patient's satisfaction.     [name].

## 2013-05-17 NOTE — Progress Notes (Signed)
Subjective: Diamond Miller is doing well this morning, no complaints.  NPO in preparation for TEE.   Objective: Vital signs in last 24 hours: Filed Vitals:   05/16/13 1446 05/16/13 2022 05/17/13 0154 05/17/13 0609  BP: 143/79 162/85 169/85 154/64  Pulse: 84 80 69 69  Temp: 97.1 F (36.2 C) 98.1 F (36.7 C) 97.5 F (36.4 C) 98.6 F (37 C)  TempSrc: Oral Oral Oral Oral  Resp: 16 16 19 20   Height:      Weight:      SpO2: 97% 96% 97% 96%   Weight change:   Intake/Output Summary (Last 24 hours) at 05/17/13 1610 Last data filed at 05/17/13 0805  Gross per 24 hour  Intake    687 ml  Output     13 ml  Net    674 ml   PEX General: alert, cooperative, NAD HEENT: NCAT, sclerae anicteric  Neck: supple, no lymphadenopathy Lungs: clear to ascultation bilaterally, normal work of respiration, no wheezes, rales, ronchi Heart: regular rate and rhythm, no murmurs, gallops, or rubs Abdomen: soft, non-tender, non-distended, normal bowel sounds Extremities: 2+ DP/PT pulses bilaterally, no cyanosis, clubbing, or edema Neurologic: alert & oriented X3, cranial nerves II-XII intact, strength grossly intact, sensation intact to light touch; no slurred speech, no facial droop appreciated  Lab Results: Basic Metabolic Panel:  Recent Labs Lab 05/14/13 0825 05/14/13 0834  NA 140 139  K 3.6 3.5  CL 102 102  CO2 27  --   GLUCOSE 110* 113*  BUN 16 15  CREATININE 0.78 0.80  CALCIUM 9.2  --    Liver Function Tests:  Recent Labs Lab 05/14/13 0825  AST 30  ALT 15  ALKPHOS 140*  BILITOT 0.4  PROT 6.9  ALBUMIN 3.2*   CBC:  Recent Labs Lab 05/14/13 0825 05/14/13 0834  WBC 11.8*  --   NEUTROABS 9.3*  --   HGB 13.8 14.3  HCT 39.4 42.0  MCV 86.6  --   PLT 274  --    Cardiac Enzymes:  Recent Labs Lab 05/14/13 0825 05/14/13 1930 05/15/13 0155 05/15/13 0715 05/15/13 1304  CKTOTAL  --  81  --   --   --   CKMB  --  2.0  --   --   --   TROPONINI 0.78* 0.58* 0.71* 0.67*  0.64*   Hemoglobin A1C:  Recent Labs Lab 05/14/13 1930  HGBA1C 5.4   Fasting Lipid Panel:  Recent Labs Lab 05/15/13 0155  CHOL 168  HDL 38*  LDLCALC 105*  TRIG 123  CHOLHDL 4.4   Thyroid Function Tests:  Recent Labs Lab 05/14/13 1930  TSH 0.354   Coagulation:  Recent Labs Lab 05/14/13 0825  LABPROT 13.2  INR 1.02   Urinalysis:  Recent Labs Lab 05/14/13 0846  COLORURINE YELLOW  LABSPEC 1.011  PHURINE 7.0  GLUCOSEU NEGATIVE  HGBUR NEGATIVE  BILIRUBINUR NEGATIVE  KETONESUR NEGATIVE  PROTEINUR NEGATIVE  UROBILINOGEN 0.2  NITRITE NEGATIVE  LEUKOCYTESUR NEGATIVE    Medications: I have reviewed the patient's current medications. Scheduled Meds: . atorvastatin  40 mg Oral q1800  . clopidogrel  75 mg Oral Q breakfast  . heparin  5,000 Units Subcutaneous Q8H  . metoprolol succinate  50 mg Oral Daily  . multivitamin with minerals  1 tablet Oral Daily  . pantoprazole  40 mg Oral Daily  . traZODone  150 mg Oral QHS   PRN Meds:.diazepam, morphine injection, nitroGLYCERIN, oxyCODONE Assessment/Plan: #Stroke: Patient presents with acute onset  of slurred speech and right hand numbness. CT head, MRI/MRA brain results above.  She presented outside window for tPA.  Patient has risk factors for stroke, including age and hypertension.  Patient has history of primary biliary cirrhosis and was advised not to take aspirin by her gastroenterologist.   Stroke workup thus far: Bilateral carotid dopplers negative.  Echo with EF 65-70%, normal systolic function, no wall motion abnormalities.  Risk factor stratification: A1C 5.4%, LDL 105, TSH within normal limits.  - neurology consult, appreciate recs  - dysphagia 2 diet per SLP  - Lipitor 40 mg daily - Plavix 75 mg daily (patient unwilling to take ASA due to recommendations by GI) - TEE today at 11a with Dr. Melburn Popper to rule-out PFO per neurology - PT/OT consults, recommending SNF placement but patient not sure if she is  willing to go; will likely have to evaluate patient's capacity to make this decision   #Elevated troponin: Trop 0.78 on admission --> 0.58 --> 0.71 --> 0.67 --> 0.64 this afternoon.  Cardiology feels confident that elevation due to demand in setting of stroke.  Patient did not have history of MI.  EKG has no significant change compared to previous one on 10/18/11. She is on metoprolol 50 mg daily at home.   - Cardiology consult, appreciate recs - continue metoprolol 50 mg daily, Lipitor 40 mg daily - TEE per above today - NST on Saturday 12/27  #HTN: BP stable at 157/75 today. On metoprolol 50 mg daily at home.  - continue metoprolol  #GERD: on Nexium at home - continue PPI   #Primary biliary cirrhosis: 04/2010 liver biopsy was highly suggestive of PBC at early-stage.  Per Dr. Collier Bullock note, patient had negative anti-smooth muscle antibody and ANA homogenous + 1:320 titer.  She was treated with trial of Urso forte which resulted in dramatic diarrhea so she is not taking any medications currently.  LFTs on admission: AST 30, ALT 15, bilirubin 0.4, albumin 3.2, protein 6.9, and ALP 140.   #DVT PPX: Heparin subq TID   Dispo: Disposition is deferred at this time, awaiting improvement of current medical problems.  Anticipated discharge in approximately 1-3 day(s).   The patient does have a current PCP Lorie Phenix, MD) and does need an [facility] hospital follow-up appointment after discharge.   .Services Needed at time of discharge: Y = Yes, Blank = No PT:   OT:   RN:   Equipment:   Other:     LOS: 3 days   Rocco Serene, MD 05/17/2013, 8:22 AM

## 2013-05-17 NOTE — Progress Notes (Signed)
Patient is due to have an TEE in the AM. Patient is NPO. TEE information reviewed with patient. Patient verbalized understanding of procedure at this time. Consent is signed, placed in chart. Patient denied having any jewelry at this time. Will continue to monitor,   Sim Boast, RN

## 2013-05-17 NOTE — Progress Notes (Signed)
Physical Therapy Treatment Patient Details Name: Diamond Miller MRN: [id number] DOB: 1933-01-20 Today's Date: 05/17/2013 Time: 2130-8657 PT Time Calculation (min): 24 min  PT Assessment / Plan / Recommendation  History of Present Illness pt presents with L Basal Ganglia Infarct.     PT Comments   Improved from evaluation.  Still not fully safety aware.   List moderately to right during turns, head turns or loss of focus.  Still appropriate to go to SNF.  Follow Up Recommendations  SNF     Does the patient have the potential to tolerate intense rehabilitation     Barriers to Discharge        Equipment Recommendations  None recommended by PT    Recommendations for Other Services    Frequency Min 4X/week   Progress towards PT Goals Progress towards PT goals: Progressing toward goals  Plan      Precautions / Restrictions Restrictions Weight Bearing Restrictions: No   Pertinent Vitals/Pain     Mobility  Bed Mobility Bed Mobility: Supine to Sit;Sitting - Scoot to Edge of Bed Supine to Sit: 4: Min guard;With rails Sitting - Scoot to Edge of Bed: 4: Min guard Details for Bed Mobility Assistance: moderate use of UE's Transfers Transfers: Sit to Stand;Stand to Sit Sit to Stand: 4: Min guard Stand to Sit: 4: Min guard Details for Transfer Assistance: cues to use UE's for safety Ambulation/Gait Ambulation/Gait Assistance: 4: Min assist Ambulation Distance (Feet): 80 Feet Assistive device: Rolling walker Ambulation/Gait Assistance Details: mildly unsteady throughout.  Tended to list off R during turns or with head turns/loss of focus Gait Pattern: Step-through pattern Gait velocity: slow Stairs: No Modified Rankin (Stroke Patients Only) Pre-Morbid Rankin Score: No significant disability Modified Rankin: Moderately severe disability    Exercises     PT Diagnosis:    PT Problem List:   PT Treatment Interventions:     PT Goals (current goals can now be found in the  care plan section) Acute Rehab PT Goals Patient Stated Goal: None stated.   Time For Goal Achievement: 05/29/13 Potential to Achieve Goals: Good  Visit Information  Last PT Received On: 05/17/13 Assistance Needed: +1 History of Present Illness: pt presents with L Basal Ganglia Infarct.      Subjective Data  Subjective: I couldn't reach to the phone (like this)"  Now I kind of fall (this way0" Patient Stated Goal: None stated.     Cognition  Cognition Arousal/Alertness: Awake/alert Behavior During Therapy: Impulsive Overall Cognitive Status: Impaired/Different from baseline Current Attention Level: Sustained Following Commands: Follows one step commands with increased time Safety/Judgement: Decreased awareness of safety;Decreased awareness of deficits Problem Solving: Slow processing    Balance  Balance Balance Assessed: Yes Static Standing Balance Static Standing - Balance Support: Bilateral upper extremity supported Static Standing - Level of Assistance: 5: Stand by assistance Static Standing - Comment/# of Minutes: tendency to list to the Right with any challenge to balance  End of Session PT - End of Session Activity Tolerance: Patient tolerated treatment well Patient left: in chair;with call bell/phone within reach;with chair alarm set Nurse Communication: Mobility status   GP     Calvin Chura, Eliseo Gum 05/17/2013, 4:47 PM 05/17/2013  Barron Bing, PT [phone]  (pager)

## 2013-05-18 ENCOUNTER — Inpatient Hospital Stay (HOSPITAL_COMMUNITY): Payer: Medicare Other

## 2013-05-18 DIAGNOSIS — R7989 Other specified abnormal findings of blood chemistry: Secondary | ICD-10-CM

## 2013-05-18 MED ORDER — TECHNETIUM TC 99M SESTAMIBI - CARDIOLITE
30.0000 | Freq: Once | INTRAVENOUS | Status: AC | PRN
  Administered 2013-05-18: 30 via INTRAVENOUS

## 2013-05-18 MED ORDER — REGADENOSON 0.4 MG/5ML IV SOLN
INTRAVENOUS | Status: AC
  Administered 2013-05-18: 0.4 mg
  Filled 2013-05-18: qty 5

## 2013-05-18 MED ORDER — TECHNETIUM TC 99M SESTAMIBI GENERIC - CARDIOLITE
10.0000 | Freq: Once | INTRAVENOUS | Status: AC | PRN
  Administered 2013-05-18: 10 via INTRAVENOUS

## 2013-05-18 NOTE — Progress Notes (Signed)
Subjective: Diamond Miller was on her way to NST this morning.  No complaints.   Objective: Vital signs in last 24 hours: Filed Vitals:   05/17/13 1802 05/17/13 2100 05/18/13 0200 05/18/13 0500  BP: 125/81 149/62 100/49 110/41  Pulse: 92 67 71 73  Temp: 97.7 F (36.5 C) 97.5 F (36.4 C) 98 F (36.7 C) 98.1 F (36.7 C)  TempSrc: Oral Oral Oral Oral  Resp: 19 20 20 20   Height:      Weight:      SpO2: 96% 98% 98% 96%   Weight change:   Intake/Output Summary (Last 24 hours) at 05/18/13 0730 Last data filed at 05/18/13 0105  Gross per 24 hour  Intake    730 ml  Output      9 ml  Net    721 ml   PEX  General: alert, cooperative, NAD HEENT: NCAT, sclerae anicteric  Neck: supple, no lymphadenopathy Lungs: clear to ascultation bilaterally, normal work of respiration, no wheezes, rales, ronchi Heart: regular rate and rhythm, no murmurs, gallops, or rubs Abdomen: soft, non-tender, non-distended, normal bowel sounds  Extremities: 2+ DP/PT pulses bilaterally, no cyanosis, clubbing, or edema Neurologic: alert & oriented X3, cranial nerves II-XII intact, strength grossly intact, sensation intact to light touch; no slurred speech, no facial droop appreciated  Lab Results: Basic Metabolic Panel:  Recent Labs Lab 05/14/13 0825 05/14/13 0834  NA 140 139  K 3.6 3.5  CL 102 102  CO2 27  --   GLUCOSE 110* 113*  BUN 16 15  CREATININE 0.78 0.80  CALCIUM 9.2  --    Liver Function Tests:  Recent Labs Lab 05/14/13 0825  AST 30  ALT 15  ALKPHOS 140*  BILITOT 0.4  PROT 6.9  ALBUMIN 3.2*   CBC:  Recent Labs Lab 05/14/13 0825 05/14/13 0834  WBC 11.8*  --   NEUTROABS 9.3*  --   HGB 13.8 14.3  HCT 39.4 42.0  MCV 86.6  --   PLT 274  --    Cardiac Enzymes:  Recent Labs Lab 05/14/13 0825 05/14/13 1930 05/15/13 0155 05/15/13 0715 05/15/13 1304  CKTOTAL  --  81  --   --   --   CKMB  --  2.0  --   --   --   TROPONINI 0.78* 0.58* 0.71* 0.67* 0.64*   Hemoglobin  A1C:  Recent Labs Lab 05/14/13 1930  HGBA1C 5.4   Fasting Lipid Panel:  Recent Labs Lab 05/15/13 0155  CHOL 168  HDL 38*  LDLCALC 105*  TRIG 123  CHOLHDL 4.4   Thyroid Function Tests:  Recent Labs Lab 05/14/13 1930  TSH 0.354   Coagulation:  Recent Labs Lab 05/14/13 0825  LABPROT 13.2  INR 1.02   Urinalysis:  Recent Labs Lab 05/14/13 0846  COLORURINE YELLOW  LABSPEC 1.011  PHURINE 7.0  GLUCOSEU NEGATIVE  HGBUR NEGATIVE  BILIRUBINUR NEGATIVE  KETONESUR NEGATIVE  PROTEINUR NEGATIVE  UROBILINOGEN 0.2  NITRITE NEGATIVE  LEUKOCYTESUR NEGATIVE   Medications: I have reviewed the patient's current medications. Scheduled Meds: . atorvastatin  40 mg Oral q1800  . clopidogrel  75 mg Oral Q breakfast  . heparin  5,000 Units Subcutaneous Q8H  . metoprolol succinate  50 mg Oral Daily  . multivitamin with minerals  1 tablet Oral Daily  . pantoprazole  40 mg Oral Daily  . traZODone  150 mg Oral QHS   PRN Meds:.diazepam, morphine injection, nitroGLYCERIN, oxyCODONE Assessment/Plan: #Stroke: Patient presents with acute onset  of slurred speech and right hand numbness. CT head, MRI/MRA brain results above. She presented outside window for tPA. Patient has risk factors for stroke, including age and hypertension. Patient has history of primary biliary cirrhosis and was advised not to take aspirin by her gastroenterologist.  Stroke workup thus far: Bilateral carotid dopplers negative. TTE with EF 65-70%, normal systolic function, no wall motion abnormalities. Risk factor stratification: A1C 5.4%, LDL 105, TSH within normal limits.  TEE on 12/26 showed no PFO, no thrombus, no source of embolus - neurology consult, appreciate recs; neuro signed off 12/26 with request for follow-up with Dr. Pearlean Brownie in 2 months - dysphagia 2 diet per SLP  - Lipitor 40 mg daily  - Plavix 75 mg daily (patient unwilling to take ASA due to recommendations by GI)  - PT/OT consults- recommending SNF  placement, but patient has been resistant to this; may have to evaluate patient's capacity to make this decision pending discussion with sw given clear memory issues and unsafe home situation (lives alone with little family support) - social work Diamond Miller, [phone]) to talk to patient today about SNF placement    #Elevated troponin: Trop 0.78 on admission --> 0.58 --> 0.71 --> 0.67 --> 0.64 this afternoon. Cardiology feels confident that elevation due to demand in setting of stroke. Patient did not have history of MI. EKG has no significant change compared to previous one on 10/18/11. She is on metoprolol 50 mg daily at home.  - Cardiology consult, appreciate recs  - NST today  - continue metoprolol 50 mg daily, Lipitor 40 mg daily  - need to touch base with cardiology about arranging outpatient tele to look for PAF per neuro recs  #HTN: BP stable at 110/41 today. On metoprolol 50 mg daily at home.  - continue metoprolol   #GERD: on Nexium at home  - continue PPI   #Primary biliary cirrhosis: 04/2010 liver biopsy was highly suggestive of PBC at early-stage. Per Dr. Collier Bullock note, patient had negative anti-smooth muscle antibody and ANA homogenous + 1:320 titer. She was treated with trial of Urso forte which resulted in dramatic diarrhea so she is not taking any medications currently. LFTs on admission: AST 30, ALT 15, bilirubin 0.4, albumin 3.2, protein 6.9, and ALP 140.   #DVT PPX: Heparin subq TID   Dispo: Anticipated discharge Monday 12/29.  The patient does have a current PCP Lorie Phenix, MD) and does need an [facility] hospital follow-up appointment after discharge.  .Services Needed at time of discharge: Y = Yes, Blank = No PT:   OT:   RN:   Equipment:   Other:     LOS: 4 days   Rocco Serene, MD 05/18/2013, 7:30 AM

## 2013-05-18 NOTE — Progress Notes (Addendum)
Pt down at nuclear med for stress test. Cannot admin meds or do assessment at this time d/t pt being off floor. Will do so when pt returns.  Claudette Head, MSN, RN @ [phone] on 05/18/13

## 2013-05-18 NOTE — Progress Notes (Signed)
Internal Medicine Attending  Date: 05/18/2013  Patient name: Diamond Miller Medical record number: [id number] Date of birth: July 15, 1932 Age: 79 y.o. Gender: female  I saw and evaluated the patient and discussed her care with house staff. I reviewed the resident's note by Dr. Aundria Rud and I agree with the resident's findings and plans as documented in her note.  Dr. Rogelia Boga will take over as attending physician tomorrow 05/19/2013.

## 2013-05-18 NOTE — Progress Notes (Signed)
Subjective: Throat is a bit sore from TEE yesterday but no other complaints. Denies CP and SOB.   Objective: Vital signs in last 24 hours: Temp:  [97.5 F (36.4 C)-98.5 F (36.9 C)] 98.1 F (36.7 C) (12/27 0500) Pulse Rate:  [63-92] 71 (12/27 0947) Resp:  [10-23] 20 (12/27 0500) BP: (100-224)/(41-154) 162/81 mmHg (12/27 0947) SpO2:  [96 %-100 %] 96 % (12/27 0500) Last BM Date: 05/17/13  Intake/Output from previous day: 12/26 0701 - 12/27 0700 In: 730 [P.O.:580; I.V.:150] Out: 9 [Urine:8; Stool:1] Intake/Output this shift:    Medications Current Facility-Administered Medications  Medication Dose Route Frequency Provider Last Rate Last Dose  . atorvastatin (LIPITOR) tablet 40 mg  40 mg Oral q1800 Lorretta Harp, MD   40 mg at 05/17/13 1643  . clopidogrel (PLAVIX) tablet 75 mg  75 mg Oral Q breakfast Rocco Serene, MD   75 mg at 05/17/13 1478  . diazepam (VALIUM) tablet 5 mg  5 mg Oral Q8H PRN Lorretta Harp, MD      . heparin injection 5,000 Units  5,000 Units Subcutaneous Q8H Lorretta Harp, MD   5,000 Units at 05/18/13 0703  . metoprolol succinate (TOPROL-XL) 24 hr tablet 50 mg  50 mg Oral Daily Lorretta Harp, MD   50 mg at 05/17/13 2956  . morphine 2 MG/ML injection 2 mg  2 mg Intravenous Q4H PRN Lorretta Harp, MD      . multivitamin with minerals tablet 1 tablet  1 tablet Oral Daily Lorretta Harp, MD   1 tablet at 05/17/13 [phone]  . nitroGLYCERIN (NITROSTAT) SL tablet 0.4 mg  0.4 mg Sublingual Q5 min PRN Lorretta Harp, MD      . oxyCODONE (Oxy IR/ROXICODONE) immediate release tablet 5 mg  5 mg Oral TID PRN Lorretta Harp, MD   5 mg at 05/17/13 2241  . pantoprazole (PROTONIX) EC tablet 40 mg  40 mg Oral Daily Lorretta Harp, MD   40 mg at 05/17/13 0953  . technetium sestamibi (CARDIOLITE) injection 30 milli Curie  30 milli Curie Intravenous Once PRN Medication Radiologist, MD      . traZODone (DESYREL) tablet 150 mg  150 mg Oral QHS Lorretta Harp, MD   75 mg at 05/17/13 2232    PE: General appearance: alert,  cooperative and no distress Lungs: clear to auscultation bilaterally Heart: regular rate and rhythm, S1, S2 normal, no murmur, click, rub or gallop Extremities: no LEE Pulses: 2+ and symmetric Skin: walker Neurologic: Grossly normal  Lab Results:  No results found for this basename: WBC, HGB, HCT, PLT,  in the last 72 hours BMET No results found for this basename: NA, K, CL, CO2, GLUCOSE, BUN, CREATININE, CALCIUM,  in the last 72 hours PT/INR No results found for this basename: LABPROT, INR,  in the last 72 hours Cholesterol No results found for this basename: CHOL,  in the last 72 hours Cardiac Panel (last 3 results)  Recent Labs  05/15/13 1304  TROPONINI 0.64*    Studies/Results:  TEE 05/17/13 Left Ventrical: Normal LV function  Mitral Valve: mild MR  Aortic Valve: normal AV  Tricuspid Valve: normal TV  Pulmonic Valve: normal  Left Atrium/ Left atrial appendage: no thrombus,  Atrial septum: intact. No right to left shunting by bubble study or color doppler  Aorta: mild calcification   Assessment/Plan    Principal Problem:   Acute ischemic stroke Active Problems:   DIABETES MELLITUS, TYPE I   HYPERTENSION   Esophageal reflux   BILIARY  CIRRHOSIS, PRIMARY   Elevated troponin I level   Altered mental status  Plan: admitted for CVA. + troponin. No chest pain. TEE yesterday revealed no thrombus. NST today to evaluate for ischemia. Cardiologist and radiologist interpretation of images to follow.     LOS: 4 days    Brittainy M. Delmer Islam 05/18/2013 9:58 AM  Agree with note written by Boyce Medici  [facility]  RIND with small trop leak. MPI today. Images pending.   Runell Gess 05/18/2013 11:40 AM

## 2013-05-18 NOTE — Progress Notes (Signed)
RN tried to give patient new IV but could not fine any veins to stick. Patient asked if this could be done a little later so she could eat her dinner and relax. Will pass along to on coming RN.

## 2013-05-18 NOTE — Progress Notes (Signed)
Clinical Child psychotherapist (CSW) met with patient to discuss skilled nursing facility placement. Patient refused SNF and stated that she wanted to go home. Patient reported that she lives in [place] in senior apartments. Patient does not have 24 hour assistance at home. CSW asked patient what her reservations about going to a SNF were, patient stated that "it is my private business and I don't want to discuss it with anybody." CSW made MD aware of above.   [name], LCSWA Weekend CSW [phone]

## 2013-05-18 NOTE — Progress Notes (Signed)
Patient wants IV placed tomr. She has already fallen asleep. Internal medicine has been paged. Paged returned-IV may be left out for the night. MD wants it placed in the morning asap. Will pass message on to on coming RN.

## 2013-05-19 ENCOUNTER — Other Ambulatory Visit (HOSPITAL_COMMUNITY): Payer: Self-pay | Admitting: Cardiology

## 2013-05-19 DIAGNOSIS — I059 Rheumatic mitral valve disease, unspecified: Secondary | ICD-10-CM

## 2013-05-19 DIAGNOSIS — I10 Essential (primary) hypertension: Secondary | ICD-10-CM

## 2013-05-19 DIAGNOSIS — I639 Cerebral infarction, unspecified: Secondary | ICD-10-CM

## 2013-05-19 DIAGNOSIS — I248 Other forms of acute ischemic heart disease: Secondary | ICD-10-CM | POA: Diagnosis present

## 2013-05-19 DIAGNOSIS — F39 Unspecified mood [affective] disorder: Secondary | ICD-10-CM

## 2013-05-19 MED ORDER — CLOPIDOGREL BISULFATE 75 MG PO TABS: 75.0000 mg | ORAL_TABLET | Freq: Every day | ORAL | Status: DC

## 2013-05-19 MED ORDER — ATORVASTATIN CALCIUM 40 MG PO TABS: 40.0000 mg | ORAL_TABLET | Freq: Every day | ORAL | Status: DC

## 2013-05-19 MED ORDER — HEART RATE MONITOR MISC: 1.0000 [IU] | Status: DC

## 2013-05-19 NOTE — Progress Notes (Signed)
   CARE MANAGEMENT NOTE 05/19/2013  Patient:  Diamond Miller, Diamond Miller   Account Number:  [id number]  Date Initiated:  05/19/2013  Documentation initiated by:  [facility]  Subjective/Objective Assessment:   adm: Stroke Symptoms     Action/Plan:   discharge plan   Anticipated DC Date:  05/19/2013   Anticipated DC Plan:  HOME W HOME HEALTH SERVICES      DC Planning Services  CM consult      [facility] Choice  HOME HEALTH   Choice offered to / List presented to:  C-1 Patient   DME arranged  3-N-1  WHEELCHAIR - MANUAL  OTHER - SEE COMMENT  3-N-1      DME agency  [facility]     HH arranged  HH-1 RN  HH-2 PT  HH-3 OT  HH-4 NURSE'S AIDE  HH-6 SOCIAL WORKER      HH agency  [facility]   Status of service:  Completed, signed off Medicare Important Message given?   (If response is "NO", the following Medicare IM given date fields will be blank) Date Medicare IM given:   Date Additional Medicare IM given:    Discharge Disposition:  HOME W HOME HEALTH SERVICES  Per UR Regulation:    If discussed at Long Length of Stay Meetings, dates discussed:    Comments:  05/19/13 16:40 CM spoke with pt in room.  Sister, Steward Drone, was also in room.  Though counseled by MD, PT/OT, RN to choose SNF, pt adamantly refuses SNF.  Address to receive HHPT/OT/RN for eval/aide/Social Worker will be [address], [place], [place] [address].  Contact number remains the same as on facesheet.  3n1 and manual wheelchair with cusion will be delivered to room prior to discharge.  Referral faxed to [facility] with appropriate address. No further CM needs were communicated.  Freddy Jaksch, BSN, CM [phone].

## 2013-05-19 NOTE — Consult Note (Signed)
Reason for Consult: Capacity evaluation for placement Referring Physician: Windell Hummingbird, MD   Diamond Miller is an 79 y.o. female.  HPI: Patient was seen and chart reviewed. Patient's sister was at bedside during this evaluation. Patient has been unhappy about her past life events like separation and he was from her husband, her daughter was raised by her husband and reportedly stated she worked 2 different jobs and this was retied and also able to care for her mother and her grandchildren. Reportedly patient fell down from couch and unable to get out on daily his sister came in car for 911. Diamond Miller is an 79 y.o. female patient who lives alone in a senior apartment in [place]. She can use her walker or cane to walk slowly at home. She talks to sister almost every day on the phone also reportedly has a couple of friends and different places she she often talks. The patient is reluctant to talk initially with the psychiatrist because of her previous negative interaction and than she started telling  knee about her life events and answers to orientation, first name, last name, date of birth, age, current date and name of the hospital.  patient keep on saying that she does not want to talk to the people about her personal issues or secrets. Patient has been knowledgeable about her general medical condition and medication needs unable to fix her own medications. Patient was unable to shop for groceries or [place] at home but able eat frozen food. Reportedly her grandson brings grocery to home and her sister helping when necessary.   Menttal Status Examination: Patient is calm, quiet, awake alert, oriented x3 and cooperative. Patient appeared as per his stated age, fairly groomed, and maintaining good eye contact. Patient has unhappy mood and his affect was appropriate. He has normal rate, rhythm, and volume of speech. His thought process is linear and goal directed. Patient has denied suicidal,  homicidal ideations, intentions or plans. Patient has no evidence of auditory or visual hallucinations, delusions, and paranoia. Patient has fair insight judgment and impulse control.  Past Medical History  Diagnosis Date  . Back pain Chronic back pain  . Scoliosis   . Liver cirrhosis   . Hypertension   . Acid reflux     Past Surgical History  Procedure Laterality Date  . Knee repalcement    . Replacement total knee    . Eye surgery      Family History  Problem Relation Age of Onset  . Heart disease Mother   . Hypertension Sister   . Dementia Brother 59    Social History:  reports that she has quit smoking. She does not have any smokeless tobacco history on file. She reports that she does not drink alcohol or use illicit drugs.  Allergies:  Allergies  Allergen Reactions  . Aspirin Other (See Comments)    Liver function  . Tylenol [Acetaminophen] Other (See Comments)    Liver function   . Penicillins Rash    Medications: I have reviewed the patient's current medications.  No results found for this or any previous visit (from the past 48 hour(s)).  Nm Myocar Multi W/spect W/wall Motion / Ef  05/18/2013   CLINICAL DATA:  Elevated troponins question ischemia  EXAM: MYOCARDIAL IMAGING WITH SPECT (REST AND EXERCISE)  GATED LEFT VENTRICULAR WALL MOTION STUDY  LEFT VENTRICULAR EJECTION FRACTION  TECHNIQUE: Standard myocardial SPECT imaging was performed after resting intravenous injection of 10 mCi Tc-64m sestamibi. Subsequently,  exercise tolerance test was performed by the patient under the supervision of the Cardiology staff. At peak-stress, 30 mCi Tc-64m sestamibi was injected intravenously and standard myocardial SPECT imaging was performed. Quantitative gated imaging was also performed to evaluate left ventricular wall motion, and estimate left ventricular ejection fraction.  COMPARISON:  None  FINDINGS: Myocardial profusion SPECT images obtained following exercise are  normal.  No focal perfusion defects identified.  Resting exam unchanged.  No pulmonary uptake of tracer.  Elevated left ventricular ejection fraction of 87% is calculated on gated SPECT images following exercise.  This is derive from an end-diastolic volume calculation of 41 mL and end systolic volume of 5 mL.  Left ventricular ejection fractions may be overestimated in patients with very low calculated end systolic volumes.  Elevated LVEF may also be seen with hyperdynamic states, valvular disease, restrictive pericarditis and IHSS.  No focal wall motion abnormalities.  IMPRESSION: Normal myocardial profusion SPECT imaging.  Mildly elevated left ventricular ejection fraction of 87% as above   Electronically Signed   By: Ulyses Southward M.D.   On: 05/18/2013 14:51    Positive for bad mood, depression and sleep disturbance Blood pressure 137/64, pulse 77, temperature 98.2 F (36.8 C), temperature source Oral, resp. rate 18, height 5' (1.524 m), weight 161 lb 6 oz (73.2 kg), SpO2 100.00%.   Assessment/Plan: Unspecified mood disorder   Recommendation:  Patient meets criteria for Capacity to make her own medical decisions and her living arrangements Patient has requested in-home care services instead of skilled nursing facility Case discussed with Windell Hummingbird, MD Appreciate psychiatric evaluation and will sign off at this time   Magnum Lunde,JANARDHAHA R. 05/19/2013, 2:06 PM

## 2013-05-19 NOTE — Discharge Summary (Signed)
Name: Diamond Miller MRN: [id number] DOB: [birth year] 79 y.o. PCP: Lorie Phenix, MD  Date of Admission: 05/14/2013  7:50 AM Date of Discharge: 05/19/2013 Attending Physician: Dr. Blanch Media  Discharge Diagnosis:  Principal Problem:   Acute ischemic stroke- no residual deficits, negative stroke work up Active Problems:   DIABETES MELLITUS, TYPE I   HYPERTENSION   Esophageal reflux   BILIARY CIRRHOSIS, PRIMARY   Elevated troponin I level (likely demand ischemia in the setting of acute CVA)   Altered mental status  Discharge Medications:   Medication List         atorvastatin 40 MG tablet  Commonly known as:  LIPITOR  Take 1 tablet (40 mg total) by mouth daily at 6 PM.     clopidogrel 75 MG tablet  Commonly known as:  PLAVIX  Take 1 tablet (75 mg total) by mouth daily with breakfast.     diazepam 5 MG tablet  Commonly known as:  VALIUM  Take 5 mg by mouth every 8 (eight) hours as needed for anxiety.     esomeprazole 40 MG capsule  Commonly known as:  NEXIUM  Take 40 mg by mouth 2 (two) times daily before a meal.     glucosamine-chondroitin 500-400 MG tablet  Take 1 tablet by mouth 3 (three) times daily.     Heart Rate Monitor Misc  1 Units by Does not apply route as directed.     magnesium oxide 400 MG tablet  Commonly known as:  MAG-OX  Take 400 mg by mouth daily.     metoprolol succinate 50 MG 24 hr tablet  Commonly known as:  TOPROL-XL  Take 50 mg by mouth daily. Take with or immediately following a meal.     moxifloxacin 0.5 % ophthalmic solution  Commonly known as:  VIGAMOX  1 drop 3 (three) times daily.     multivitamin with minerals Tabs tablet  Take 1 tablet by mouth daily.     oxyCODONE 5 MG immediate release tablet  Commonly known as:  Oxy IR/ROXICODONE  Take 5 mg by mouth 3 (three) times daily as needed for moderate pain. For pain     traZODone 150 MG tablet  Commonly known as:  DESYREL  Take 150 mg by mouth at bedtime.          Disposition and follow-up:   Diamond Miller was discharged from [facility] in Stable condition.  At the hospital follow up visit please address:  1.  Stability and function at home. PT/OT had recommended SNF, but pt adamantly refused and wanted [facility] instead.   2. Neuro recommended outpatient telemetry monitoring to evaluate for A fib, which cardiology set up for patient prior to her discharge.  2.  Labs / imaging needed at time of follow-up: none  3.  Pending labs/ test needing follow-up: none  Follow-up Appointments:     Follow-up Information   Follow up with Gates Rigg, MD. Schedule an appointment as soon as possible for a visit in 2 months. (stroke clinic)    Specialties:  Neurology, Radiology   Contact information:   [address] [place] [address] [phone]       Follow up with [facility]. (our office will call you with date and time to pick up heart monitor)    Contact information:   [address] [place] [address] [phone]      Follow up with [facility]. (home  health physical and occupational therapy, nurse for eval, aide, social worker)    Contact information:   [address] [place] [address] [phone]       Follow up with [facility], MD. Call in 1 week.   Specialty:  Family Medicine   Contact information:   [address] [place] [address] [phone]       Discharge Instructions: Discharge Orders   Future Orders Complete By Expires   Call MD for:  difficulty breathing, headache or visual disturbances  As directed    Call MD for:  persistant dizziness or light-headedness  As directed    Call MD for:  severe uncontrolled pain  As directed    Diet - low sodium heart healthy  As directed    Increase activity slowly  As directed       Consultations:  Cardiology Neurology  Nehemiah Settle, MD  (psychiatry)  Procedures Performed:  Ct Head Wo Contrast  05/14/2013   CLINICAL DATA:  Slurred speech, found on the floor  EXAM: CT HEAD WITHOUT CONTRAST  TECHNIQUE: Contiguous axial images were obtained from the base of the skull through the vertex without intravenous contrast.  COMPARISON:  None.  FINDINGS: No skull fracture is noted. There is mucosal thickening with air-fluid level left sphenoid sinus. The mastoid air cells are unremarkable. No intracranial hemorrhage, mass effect or midline shift. Mild cerebral atrophy. There is probable old infarct with mild encephalomalacia in right frontal lobe. There is decreased attenuation in left basal ganglia measures about 1.3 cm. This is highly suspicious for evolving infarct/ischemia. Best seen in axial image 17. Further evaluation with MRI with diffusion imaging is recommended. No mass lesion is noted on this unenhanced scan.  IMPRESSION: No intracranial hemorrhage, mass effect or midline shift. Mild cerebral atrophy. There is probable old infarct with mild encephalomalacia in right frontal lobe. There is decreased attenuation in left basal ganglia measures about 1.3 cm. This is highly suspicious for evolving infarct/ischemia. Best seen in axial image 17. Further evaluation with MRI with diffusion imaging is recommended.  These results were called by telephone at the time of interpretation on 05/14/2013 at 9:44 AM to Dr. Cathren Laine , who verbally acknowledged these results.   Electronically Signed   By: Natasha Mead M.D.   On: 05/14/2013 09:45   Mr Maxine Glenn Head Wo Contrast  05/14/2013   CLINICAL DATA:  Difficulty with speech. Numbness in hands. Cirrhosis. Hypertension.  EXAM: MRI HEAD WITHOUT CONTRAST  MRA HEAD WITHOUT CONTRAST  TECHNIQUE: Multiplanar, multiecho pulse sequences of the brain and surrounding structures were obtained without intravenous contrast. Angiographic images of the head were obtained using MRA technique without contrast.  COMPARISON:   05/14/2013 head CT.  FINDINGS: MRI HEAD FINDINGS  Small to moderate size acute infarct involving portions of the left operculum region/ subinsular region, posterior aspect of the left internal capsule, posterior left coronal radiata and left frontal lobe. Small amount of hemorrhage within the posterior left lenticular nucleus acute infarct.  Remote right operculum region infarct extending to the right corona radiata with encephalomalacia.  Remote small right cerebellar infarcts.  Mild small vessel disease type changes.  Global atrophy without hydrocephalus.  No intracranial mass lesion noted on this unenhanced exam.  Mild cervical spondylotic changes. Cervical medullary junction, pituitary region, pineal region and orbital structures unremarkable.  Partial opacification left sphenoid sinus air cell.  MRA HEAD FINDINGS  M1 segment right middle cerebral artery mild to slightly moderate narrowing.  Middle cerebral artery branch vessel narrowing, irregularity and areas of occlusion bilaterally more notable on the left.  Left vertebral artery ends in a posterior inferior cerebellar artery distribution with vessels narrowed and irregular.  Right narrowing of the distal right vertebral artery.  Moderate to marked narrowing proximal to mid basilar artery.  Nonvisualization the anterior inferior cerebellar arteries.  Moderate to marked narrowing involving portions of the superior cerebellar arteries and posterior cerebral arteries bilaterally most notable involving the right posterior cerebral artery.  No aneurysm identified.  Fetal contribution to the posterior cerebral arteries.  IMPRESSION: Small to moderate size acute infarct involving portions of the left operculum region/ subinsular region, posterior aspect of the left internal capsule, posterior left coronal radiata and left frontal lobe. Small amount of hemorrhage within the posterior left lenticular nucleus acute infarct.  Remote right operculum region infarct  extending to the right corona radiata with encephalomalacia.  Remote small right cerebellar infarcts.  Mild small vessel disease type changes.  Global atrophy without hydrocephalus.  Partial opacification left sphenoid sinus air cell.  Intracranial atherosclerotic type changes as detailed above.  These results will be called to the ordering clinician or representative by the Radiologist Assistant, and communication documented in the PACS Dashboard.   Electronically Signed   By: Bridgett Larsson M.D.   On: 05/14/2013 17:31   Mr Brain Wo Contrast  05/14/2013   CLINICAL DATA:  Difficulty with speech. Numbness in hands. Cirrhosis. Hypertension.  EXAM: MRI HEAD WITHOUT CONTRAST  MRA HEAD WITHOUT CONTRAST  TECHNIQUE: Multiplanar, multiecho pulse sequences of the brain and surrounding structures were obtained without intravenous contrast. Angiographic images of the head were obtained using MRA technique without contrast.  COMPARISON:  05/14/2013 head CT.  FINDINGS: MRI HEAD FINDINGS  Small to moderate size acute infarct involving portions of the left operculum region/ subinsular region, posterior aspect of the left internal capsule, posterior left coronal radiata and left frontal lobe. Small amount of hemorrhage within the posterior left lenticular nucleus acute infarct.  Remote right operculum region infarct extending to the right corona radiata with encephalomalacia.  Remote small right cerebellar infarcts.  Mild small vessel disease type changes.  Global atrophy without hydrocephalus.  No intracranial mass lesion noted on this unenhanced exam.  Mild cervical spondylotic changes. Cervical medullary junction, pituitary region, pineal region and orbital structures unremarkable.  Partial opacification left sphenoid sinus air cell.  MRA HEAD FINDINGS  M1 segment right middle cerebral artery mild to slightly moderate narrowing.  Middle cerebral artery branch vessel narrowing, irregularity and areas of occlusion bilaterally  more notable on the left.  Left vertebral artery ends in a posterior inferior cerebellar artery distribution with vessels narrowed and irregular.  Right narrowing of the distal right vertebral artery.  Moderate to marked narrowing proximal to mid basilar artery.  Nonvisualization the anterior inferior cerebellar arteries.  Moderate to marked narrowing involving portions of the superior cerebellar arteries and posterior cerebral arteries bilaterally most notable involving the right posterior cerebral artery.  No aneurysm identified.  Fetal contribution to the posterior cerebral arteries.  IMPRESSION: Small to moderate size acute infarct involving portions of the left operculum region/ subinsular region, posterior aspect of the left internal capsule, posterior left coronal radiata and left frontal lobe. Small amount of hemorrhage within the posterior left lenticular nucleus acute infarct.  Remote right operculum region infarct extending to the right corona radiata with encephalomalacia.  Remote small right cerebellar infarcts.  Mild small vessel disease type changes.  Global atrophy without  hydrocephalus.  Partial opacification left sphenoid sinus air cell.  Intracranial atherosclerotic type changes as detailed above.  These results will be called to the ordering clinician or representative by the Radiologist Assistant, and communication documented in the PACS Dashboard.   Electronically Signed   By: Bridgett Larsson M.D.   On: 05/14/2013 17:31   Nm Myocar Multi W/spect W/wall Motion / Ef  05/18/2013   CLINICAL DATA:  Elevated troponins question ischemia  EXAM: MYOCARDIAL IMAGING WITH SPECT (REST AND EXERCISE)  GATED LEFT VENTRICULAR WALL MOTION STUDY  LEFT VENTRICULAR EJECTION FRACTION  TECHNIQUE: Standard myocardial SPECT imaging was performed after resting intravenous injection of 10 mCi Tc-44m sestamibi. Subsequently, exercise tolerance test was performed by the patient under the supervision of the Cardiology  staff. At peak-stress, 30 mCi Tc-13m sestamibi was injected intravenously and standard myocardial SPECT imaging was performed. Quantitative gated imaging was also performed to evaluate left ventricular wall motion, and estimate left ventricular ejection fraction.  COMPARISON:  None  FINDINGS: Myocardial profusion SPECT images obtained following exercise are normal.  No focal perfusion defects identified.  Resting exam unchanged.  No pulmonary uptake of tracer.  Elevated left ventricular ejection fraction of 87% is calculated on gated SPECT images following exercise.  This is derive from an end-diastolic volume calculation of 41 mL and end systolic volume of 5 mL.  Left ventricular ejection fractions may be overestimated in patients with very low calculated end systolic volumes.  Elevated LVEF may also be seen with hyperdynamic states, valvular disease, restrictive pericarditis and IHSS.  No focal wall motion abnormalities.  IMPRESSION: Normal myocardial profusion SPECT imaging.  Mildly elevated left ventricular ejection fraction of 87% as above   Electronically Signed   By: Ulyses Southward M.D.   On: 05/18/2013 14:51    2D Echo: No significant heart disease. The etiology of the patient's symptoms is still not clear. The contrast study was inadequate to rule out shunt. No cardiac source of embolism was identified, but cannot be ruled out on the basis of this examination.  TEE: Study Conclusions  - Left ventricle: Systolic function was normal. The estimated ejection fraction was in the range of 60% to 65%. - Mitral valve: Mild regurgitation. - Left atrium: No evidence of thrombus in the atrial cavity or appendage. - Atrial septum: No defect or patent foramen ovale was identified  Admission HPI:  Patient has PMH of primary biliary cirrhosis, hypertension, questionable diabetes type 1 (not on any medications) and GERD, who presents with slurry speech, hand numbness and altered mental status.   Patient has  difficulty in speaking. The history was provided by her sister. Per her sister, patient is living alone in a senior apartment in [place]. At her baseline, she can use her walker to walk slowly at home. She talks to sister almost every day on the phone.   The patient was last known normal at 3:30 PM yesterday when her sister talked to her on the phone. At about 6:10 in the morning, patient called her sister telling that she may have a stroke. She told her sister that she feels numb in her hand (did not say which hand). Her sister noticed that patient had a slurry speech on the phone. Then, her sister went to the patient's home. Because patient's apartment was locked inside, she called 911 and found pt lying on the floor next to couch with AMS and slurry speech. She seems to be able to move all extremities. Patient was noticed to have  mild left facial droop and urinary incontinence. Per EMS, patient's blood pressure was elevated at 210/160 mmHg. CT-head showed no intracranial hemorrhage, but there is a decreased attenuation in left basal ganglia measures about 1.3 cm which is suspicious for evolving infarct/ischemia per radiologist.   Of note, patient was found to have elevated troponin of 0.78 in ED. Patient cannot tell whether she has chest pain or not. EKG has no significant change compared to previous one on 10/18/11.   Hospital Course by problem list:  #Acute infarct w/ small hemorrhage per MRI: Patient presented with acute onset of slurred speech and right hand numbness, though now with no residual deficits. Bilateral carotid dopplers negative. TTE with EF 65-70%, normal systolic function, no wall motion abnormalities. TEE on 12/26 showed no PFO, no thrombus, no source of embolus. Risk factor stratification: A1C 5.4%, LDL 105, TSH within normal limits. Neurology was consulted and recommended stroke work up and since the work up was negative, they recommended outpatient telemetry to evaluate for  possible A fib, which cardiology set up prior to patient discharge. Neurology requested follow-up with Dr. Pearlean Brownie in 2 months, which patient and daughter were told to call the office to make this appointment since patient discharged on a weekend. Patient was started on lipitor 40mg  daily and plavix 75mg  daily (pt cannot take ASA given PBC), which were both continued at discharge. PT/OT evaluated patient and both recommended SNF placement, though patient adamantly refusing. Psychiatry was consulted to evaluate capacity of patient to make this decision. Psych thought pt met criteria for having capacity to make her own medical decisions, so patient was discharged with [facility] PT, OT, aid, RN, social work (though this was against medical advice). Pt also discharged with light weight wheelchair and 3 in 1 bedside commode.  #Demand ischemia in setting of acute CVA: Trop 0.78 on admission, which subsequently downtrended. Cardiology was consulted. Thought to be due to demand ischemia in the setting of acute stroke. EKG had no significant change compared to previous one on 10/18/11. She is on metoprolol 50 mg daily at home, which was held initially for permissive hypertension, then restarted. Pt also managed on lipitor as per above. NST was performed, which was normal as per above.    #HTN: BP stable. Metoprolol as per above.  Discharge Vitals:   BP 137/64  Pulse 77  Temp(Src) 98.2 F (36.8 C) (Oral)  Resp 18  Ht 5' (1.524 m)  Wt 73.2 kg (161 lb 6 oz)  BMI 31.52 kg/m2  SpO2 100%  Discharge Labs:  No results found for this or any previous visit (from the past 24 hour(s)).  Signed: Windell Hummingbird, MD 05/19/2013, 4:38 PM   Time Spent on Discharge: 35 minutes Services Ordered on Discharge: HH PT, OT, Aid, RN, social work Equipment Ordered on Discharge: lightweight wheelchair, 3 in 1 bedside commode

## 2013-05-19 NOTE — Progress Notes (Signed)
Subjective:  No chest pain or SOB  Objective:   Vital Signs in the last 24 hours: Temp:  [97.6 F (36.4 C)-97.9 F (36.6 C)] 97.9 F (36.6 C) (12/28 0603) Pulse Rate:  [67-90] 67 (12/28 0603) Resp:  [18-20] 20 (12/28 0603) BP: (111-166)/(50-98) 111/50 mmHg (12/28 0603) SpO2:  [97 %-99 %] 99 % (12/28 0603)  Intake/Output from previous day: 12/27 0701 - 12/28 0700 In: -  Out: 1 [Urine:1]  Medications: . atorvastatin  40 mg Oral q1800  . clopidogrel  75 mg Oral Q breakfast  . heparin  5,000 Units Subcutaneous Q8H  . metoprolol succinate  50 mg Oral Daily  . multivitamin with minerals  1 tablet Oral Daily  . pantoprazole  40 mg Oral Daily  . traZODone  150 mg Oral QHS       Physical Exam:   General appearance: alert, cooperative and no distress Neck: no JVD, supple, symmetrical, trachea midline and thyroid not enlarged, symmetric, no tenderness/mass/nodules Lungs: clear to auscultation bilaterally Heart: regular rate and rhythm Abdomen: soft, non-tender; bowel sounds normal; no masses,  no organomegaly Extremities: abrasion R elbow area; no edema Skin: Skin color, texture, turgor normal.    Rate: 79  Rhythm: normal sinus rhythm  Lab Results:   No results found for this basename: NA, K, CL, CO2, GLUCOSE, BUN, CREATININE,  in the last 72 hours No results found for this basename: TROPONINI, CK, MB,  in the last 72 hours Hepatic Function Panel No results found for this basename: PROT, ALBUMIN, AST, ALT, ALKPHOS, BILITOT, BILIDIR, IBILI,  in the last 72 hours No results found for this basename: INR,  in the last 72 hours BNP (last 3 results) No results found for this basename: PROBNP,  in the last 8760 hours  Lipid Panel     Component Value Date/Time   CHOL 168 05/15/2013 0155   TRIG 123 05/15/2013 0155   HDL 38* 05/15/2013 0155   CHOLHDL 4.4 05/15/2013 0155   VLDL 25 05/15/2013 0155   LDLCALC 105* 05/15/2013 0155      Imaging:  Nm Myocar Multi W/spect  W/wall Motion / Ef  05/18/2013   CLINICAL DATA:  Elevated troponins question ischemia  EXAM: MYOCARDIAL IMAGING WITH SPECT (REST AND EXERCISE)  GATED LEFT VENTRICULAR WALL MOTION STUDY  LEFT VENTRICULAR EJECTION FRACTION  TECHNIQUE: Standard myocardial SPECT imaging was performed after resting intravenous injection of 10 mCi Tc-56m sestamibi. Subsequently, exercise tolerance test was performed by the patient under the supervision of the Cardiology staff. At peak-stress, 30 mCi Tc-38m sestamibi was injected intravenously and standard myocardial SPECT imaging was performed. Quantitative gated imaging was also performed to evaluate left ventricular wall motion, and estimate left ventricular ejection fraction.  COMPARISON:  None  FINDINGS: Myocardial profusion SPECT images obtained following exercise are normal.  No focal perfusion defects identified.  Resting exam unchanged.  No pulmonary uptake of tracer.  Elevated left ventricular ejection fraction of 87% is calculated on gated SPECT images following exercise.  This is derive from an end-diastolic volume calculation of 41 mL and end systolic volume of 5 mL.  Left ventricular ejection fractions may be overestimated in patients with very low calculated end systolic volumes.  Elevated LVEF may also be seen with hyperdynamic states, valvular disease, restrictive pericarditis and IHSS.  No focal wall motion abnormalities.  IMPRESSION: Normal myocardial profusion SPECT imaging.  Mildly elevated left ventricular ejection fraction of 87% as above   Electronically Signed   By: Ulyses Southward  M.D.   On: 05/18/2013 14:51      Assessment/Plan:   Principal Problem:   Acute ischemic stroke Active Problems:   DIABETES MELLITUS, TYPE I   HYPERTENSION   Esophageal reflux   BILIARY CIRRHOSIS, PRIMARY   Elevated troponin I level   Altered mental status   TEE without thrombus, valves nl with mild MR. Negative bubble study.  Nuclear study normal with vigorous LV  contractility. Rhythm remains stable. BP controlled.  Will sign off.   Lennette Bihari, MD, [facility] 05/19/2013, 8:47 AM

## 2013-05-19 NOTE — Progress Notes (Addendum)
Subjective: Diamond Miller feels well this morning. Her only complaint is mild soreness to R elbow, denies focal weakness or numbness/tingling, difficulty with speech. Cards signed off after normal TEE. Pt still adamantly refusing SNF for unknown reasons. She states "my reasons are personal and I am not going to tell you." She is A&O x 3.  Objective: Vital signs in last 24 hours: Filed Vitals:   05/18/13 1724 05/18/13 2210 05/19/13 0207 05/19/13 0603  BP: 149/98 140/83 114/57 111/50  Pulse: 77 78 73 67  Temp: 97.7 F (36.5 C) 97.7 F (36.5 C) 97.7 F (36.5 C) 97.9 F (36.6 C)  TempSrc: Oral Oral Oral Oral  Resp: 18 18 18 20   Height:      Weight:      SpO2: 99% 98% 99% 99%   Weight change:   Intake/Output Summary (Last 24 hours) at 05/19/13 1011 Last data filed at 05/18/13 1903  Gross per 24 hour  Intake      0 ml  Output      1 ml  Net     -1 ml   PEX  General: alert, cooperative, NAD HEENT: NCAT, sclerae anicteric  Neck: supple Lungs: clear to ascultation bilaterally, normal work of respiration, no wheezes, rales, ronchi Heart: regular rate and rhythm, no murmurs, gallops, or rubs Abdomen: soft, non-tender, non-distended, normal bowel sounds; + scattered contusions to abdomen Extremities: warm extremities bilaterally; no BLE edema Neurologic: alert & oriented X3, cranial nerves II-XII intact, strength grossly intact, sensation intact to light touch; no slurred speech, no facial droop appreciated  Lab Results: Basic Metabolic Panel:  Recent Labs Lab 05/14/13 0825 05/14/13 0834  NA 140 139  K 3.6 3.5  CL 102 102  CO2 27  --   GLUCOSE 110* 113*  BUN 16 15  CREATININE 0.78 0.80  CALCIUM 9.2  --    Liver Function Tests:  Recent Labs Lab 05/14/13 0825  AST 30  ALT 15  ALKPHOS 140*  BILITOT 0.4  PROT 6.9  ALBUMIN 3.2*   CBC:  Recent Labs Lab 05/14/13 0825 05/14/13 0834  WBC 11.8*  --   NEUTROABS 9.3*  --   HGB 13.8 14.3  HCT 39.4 42.0  MCV  86.6  --   PLT 274  --    Cardiac Enzymes:  Recent Labs Lab 05/14/13 0825 05/14/13 1930 05/15/13 0155 05/15/13 0715 05/15/13 1304  CKTOTAL  --  81  --   --   --   CKMB  --  2.0  --   --   --   TROPONINI 0.78* 0.58* 0.71* 0.67* 0.64*   Hemoglobin A1C:  Recent Labs Lab 05/14/13 1930  HGBA1C 5.4   Fasting Lipid Panel:  Recent Labs Lab 05/15/13 0155  CHOL 168  HDL 38*  LDLCALC 105*  TRIG 123  CHOLHDL 4.4   Thyroid Function Tests:  Recent Labs Lab 05/14/13 1930  TSH 0.354   Coagulation:  Recent Labs Lab 05/14/13 0825  LABPROT 13.2  INR 1.02   Urinalysis:  Recent Labs Lab 05/14/13 0846  COLORURINE YELLOW  LABSPEC 1.011  PHURINE 7.0  GLUCOSEU NEGATIVE  HGBUR NEGATIVE  BILIRUBINUR NEGATIVE  KETONESUR NEGATIVE  PROTEINUR NEGATIVE  UROBILINOGEN 0.2  NITRITE NEGATIVE  LEUKOCYTESUR NEGATIVE   Medications: I have reviewed the patient's current medications. Scheduled Meds: . atorvastatin  40 mg Oral q1800  . clopidogrel  75 mg Oral Q breakfast  . heparin  5,000 Units Subcutaneous Q8H  . metoprolol succinate  50  mg Oral Daily  . multivitamin with minerals  1 tablet Oral Daily  . pantoprazole  40 mg Oral Daily  . traZODone  150 mg Oral QHS   PRN Meds:.diazepam, morphine injection, nitroGLYCERIN, oxyCODONE  Assessment/Plan:  #Acute infarct w/ small hemorrhage per MRI: No residual deficits. Bilateral carotid dopplers negative. TTE with EF 65-70%, normal systolic function, no wall motion abnormalities. Risk factor stratification: A1C 5.4%, LDL 105, TSH within normal limits.  TEE on 12/26 showed no PFO, no thrombus, no source of embolus - neuro signed off 12/26 with request for follow-up with Dr. Pearlean Brownie in 2 months - dysphagia 2 diet per SLP  - Lipitor 40 mg daily  - Plavix 75 mg daily (patient unwilling to take ASA due to recommendations by GI)  - PT/OT consults- recommending SNF placement, but pt refusing; psych saw today and patient does have  capacity, therefore I spoke with PT and they think [facility] PT/OT will be beneficial  - social work Fredric Mare, [phone]) talked to pt about snf, but pt still refusing -outpatient telemetry set up by cardiology  #Demand ischemia in setting of acute CVA: Trop 0.78 on admission -->down trending, stopped trending 12/27. EKG had no significant change compared to previous one on 10/18/11.  - Cardiology consulted, but now signed off given negative TEE and normal NST - continue metoprolol 50 mg daily, Lipitor 40 mg daily   #HTN: BP stable today. On metoprolol 50 mg daily at home.  - continue metoprolol   #GERD: on Nexium at home  - continue PPI   #DVT PPX: Heparin subq TID   Dispo: Anticipated discharge Monday 12/29.  The patient does have a current PCP Lorie Phenix, MD) and does need an [facility] hospital follow-up appointment after discharge.  .Services Needed at time of discharge: Y = Yes, Blank = No PT:   OT:   RN:   Equipment:   Other:     LOS: 5 days   Windell Hummingbird, MD 05/19/2013, 10:11 AM

## 2013-05-19 NOTE — Progress Notes (Signed)
Patient discharged home accompany by sister. [facility] arranged with [facility]. Discharged instructions and home precautions given to patient. RN informed patient to keep her follow up appointments and make sure she picks up her heart monitor at the CVD office. Patient and sister stated understanding of instructions given.

## 2013-05-19 NOTE — Progress Notes (Signed)
Spoke with MD regarding patient's discharge.  Pt adamantly refusing SNF.  Pt informed this is still the recommended d/c plan from PT, but continues to insist on home.  HHPT with 24 hour assist/supervision would be needed/recommended for d/c home.  Aida Raider, PT  Office # [phone] Pager [phone]

## 2013-05-20 ENCOUNTER — Telehealth: Payer: Self-pay | Admitting: Cardiology

## 2013-05-20 ENCOUNTER — Encounter (HOSPITAL_COMMUNITY): Payer: Self-pay | Admitting: Cardiovascular Disease

## 2013-05-20 NOTE — Telephone Encounter (Signed)
Returned call to patient. Informed patient that she has an appointment to be fitted with monitor and that she should come in so that it may be reviewed with her as to how to works, etc. Patient agreed with plan.

## 2013-05-20 NOTE — Telephone Encounter (Signed)
Suppose to have monitor put on tomorrow,wants to know if she can send somebody to pick it up. This message came from the answering machine on my line.

## 2013-05-20 NOTE — Progress Notes (Signed)
UR complete.  Emaly Boschert RN, MSN 

## 2013-05-21 ENCOUNTER — Ambulatory Visit: Payer: Medicare Other | Admitting: *Deleted

## 2013-05-21 DIAGNOSIS — R55 Syncope and collapse: Secondary | ICD-10-CM

## 2013-05-22 ENCOUNTER — Telehealth: Payer: Self-pay | Admitting: Cardiology

## 2013-05-22 ENCOUNTER — Ambulatory Visit (INDEPENDENT_AMBULATORY_CARE_PROVIDER_SITE_OTHER): Payer: Medicare Other | Admitting: *Deleted

## 2013-05-22 DIAGNOSIS — R079 Chest pain, unspecified: Secondary | ICD-10-CM

## 2013-05-22 NOTE — Telephone Encounter (Signed)
Returned call and pt verified x 2.  Pt stated she came in yesterday to get a monitor, but it wasn't put on her.  Stated she just picked it up.  Stated she has been reading and doesn't know how to set it up.  Stated she doesn't have a land line.  Pt informed she doesn't need a land line for the monitor and she can return to the clinic now to have monitor placed or call [employer] to have them walk her through set up.  Pt verbalized understanding and stated she will come now.  Appt scheduled for 4pm today.

## 2013-05-22 NOTE — Telephone Encounter (Signed)
Came in yesterday and got heart monitor  Was not put on her.  Has not put on yet because she does not have a home phone to call in remote.  Please call has questions.

## 2013-05-22 NOTE — Progress Notes (Signed)
Pt in for Cardiac Event Monitor placement.  Monitor hooked up and instructions given.  Pt to call [employer] for questions and supplies for monitor.  Monitor was completed w/o charge and it was charged while in the office.  Pt advised to charge when she gets home.  Pt verbalized understanding and agreed w/ plan.

## 2013-05-23 ENCOUNTER — Telehealth: Payer: Self-pay | Admitting: Cardiology

## 2013-05-23 NOTE — Telephone Encounter (Signed)
I was notified by [facility] that pt had run of PAF.  She has recent hx of stroke.  Diamond Miller was unremarkable and monitor was placed.  We will have her be seen 05/24/12 to determine anticoagulation.  Office will call her with an appointment.

## 2013-05-23 NOTE — Telephone Encounter (Signed)
Pt aware that our office will call her to be seen on Friday the 2nd.

## 2013-05-23 NOTE — Telephone Encounter (Signed)
See note

## 2013-05-23 NOTE — Telephone Encounter (Signed)
Was called by [employer]: Patient had a conversion pause from A. fib that lasted 7 seconds. The moment of the call patient is a normal sinus rhythm at the rate of 72 beats per minute

## 2013-05-24 ENCOUNTER — Encounter: Payer: Self-pay | Admitting: Cardiology

## 2013-05-24 ENCOUNTER — Ambulatory Visit (INDEPENDENT_AMBULATORY_CARE_PROVIDER_SITE_OTHER): Payer: Medicare Other | Admitting: Cardiology

## 2013-05-24 VITALS — BP 118/82 | HR 78 | Ht 61.0 in | Wt 175.7 lb

## 2013-05-24 DIAGNOSIS — K745 Biliary cirrhosis, unspecified: Secondary | ICD-10-CM

## 2013-05-24 DIAGNOSIS — I639 Cerebral infarction, unspecified: Secondary | ICD-10-CM

## 2013-05-24 DIAGNOSIS — R778 Other specified abnormalities of plasma proteins: Secondary | ICD-10-CM | POA: Insufficient documentation

## 2013-05-24 DIAGNOSIS — I10 Essential (primary) hypertension: Secondary | ICD-10-CM

## 2013-05-24 DIAGNOSIS — I6350 Cerebral infarction due to unspecified occlusion or stenosis of unspecified cerebral artery: Secondary | ICD-10-CM

## 2013-05-24 DIAGNOSIS — R7989 Other specified abnormal findings of blood chemistry: Secondary | ICD-10-CM

## 2013-05-24 NOTE — Assessment & Plan Note (Signed)
Treated

## 2013-05-24 NOTE — Patient Instructions (Signed)
Your physician recommends that you schedule a follow-up appointment in: On Monday with Dr Royann Shiversroitoru at 8:00 am  Your physician has recommended you make the following change in your medication: Take half tablets of Metoprolol

## 2013-05-24 NOTE — Assessment & Plan Note (Signed)
Good recovery but still has unsteady gait for other reasons

## 2013-05-24 NOTE — Progress Notes (Signed)
05/24/2013 Diamond Ruddlelara L Miller   May 17, 1933  [id number]  Primary Physicia MALONEY,NANCY, MD Primary Cardiologist: Dr Herbie BaltimoreHarding  HPI:  80 y/o female recently admitted with a Lt brain CVA. She had no documented arrythmia. A TEE was unremarkable. After discharge a Holter monitor showed PAF with pauses up to 7 seconds. She was seen today for further evaluation. The pt denies any syncope or palpitations. She lives alone. Unfortunately I don't think she has a good understanding of her medical issues. She is unsteady on her feet and has had several falls. She attributes this to DJD and a "failed" knee replacement. She also has biliary cirrhosis followed by Dr Christella HartiganJacobs. I reviewed her strips with Dr Herbie BaltimoreHarding today in the office.    Current Outpatient Prescriptions  Medication Sig Dispense Refill  . atorvastatin (LIPITOR) 40 MG tablet Take 1 tablet (40 mg total) by mouth daily at 6 PM.  30 tablet  0  . clopidogrel (PLAVIX) 75 MG tablet Take 1 tablet (75 mg total) by mouth daily with breakfast.  30 tablet  0  . diazepam (VALIUM) 5 MG tablet Take 5 mg by mouth every 8 (eight) hours as needed for anxiety.       Marland Kitchen. glucosamine-chondroitin 500-400 MG tablet Take 1 tablet by mouth 3 (three) times daily.       . magnesium oxide (MAG-OX) 400 MG tablet Take 400 mg by mouth daily.        . metoprolol succinate (TOPROL-XL) 50 MG 24 hr tablet Take 50 mg by mouth daily. Take with or immediately following a meal.      . Misc. Devices (HEART RATE MONITOR) MISC 1 Units by Does not apply route as directed.  1 each  0  . Multiple Vitamin (MULITIVITAMIN WITH MINERALS) TABS Take 1 tablet by mouth daily.      Marland Kitchen. oxyCODONE (OXY IR/ROXICODONE) 5 MG immediate release tablet Take 5 mg by mouth 3 (three) times daily as needed for moderate pain. For pain      . traZODone (DESYREL) 150 MG tablet Take 75 mg by mouth at bedtime.       Marland Kitchen. esomeprazole (NEXIUM) 40 MG capsule Take 40 mg by mouth 2 (two) times daily before a meal.        No  current facility-administered medications for this visit.    Allergies  Allergen Reactions  . Aspirin Other (See Comments)    Liver function  . Tylenol [Acetaminophen] Other (See Comments)    Liver function   . Penicillins Rash    History   Social History  . Marital Status: Widowed    Spouse Name: N/A    Number of Children: N/A  . Years of Education: N/A   Occupational History  . Not on file.   Social History Main Topics  . Smoking status: Former Games developermoker  . Smokeless tobacco: Not on file  . Alcohol Use: No  . Drug Use: No  . Sexual Activity: Not on file   Other Topics Concern  . Not on file   Social History Narrative  . No narrative on file     Review of Systems: General: negative for chills, fever, night sweats or weight changes.  Cardiovascular: negative for chest pain, dyspnea on exertion, edema, orthopnea, palpitations, paroxysmal nocturnal dyspnea or shortness of breath Dermatological: negative for rash Respiratory: negative for cough or wheezing Urologic: negative for hematuria Abdominal: negative for nausea, vomiting, diarrhea, bright red blood per rectum, melena, or hematemesis Neurologic: negative for visual changes, syncope,  or dizziness All other systems reviewed and are otherwise negative except as noted above.    Blood pressure 118/82, pulse 78, height 5\' 1"  (1.549 m), weight 175 lb 11.2 oz (79.697 kg).  General appearance: alert, cooperative, no distress and morbidly obese Neck: no carotid bruit and no JVD Lungs: clear to auscultation bilaterally Heart: regular rate and rhythm Extremities: no edema Skin: warm and dry Neurologic: Grossly normal  EKG NSR  ASSESSMENT AND PLAN:   Stroke Lt brain-05/14/13- probably embolic Good recovery but still has unsteady gait for other reasons  Elevated troponin- negative Myoview, Nl LVF No angina  BILIARY CIRRHOSIS, PRIMARY .  HYPERTENSION Treated   PLAN  After discussion with Dr Herbie Baltimore we  decided to cut her Toprol in half. She is in NSR today. She will probably need a pacemaker. She will see Dr Royann Shivers Monday in the office. She is probably not a long term anticoagulation candidate.  Diamond Miller KPA-C 05/24/2013 5:15 PM

## 2013-05-24 NOTE — Assessment & Plan Note (Signed)
No angina 

## 2013-05-27 ENCOUNTER — Encounter: Payer: Self-pay | Admitting: Cardiovascular Disease

## 2013-05-27 ENCOUNTER — Ambulatory Visit (INDEPENDENT_AMBULATORY_CARE_PROVIDER_SITE_OTHER): Payer: Medicare Other | Admitting: Cardiovascular Disease

## 2013-05-27 VITALS — BP 118/80 | HR 92 | Resp 16 | Ht 60.0 in | Wt 174.6 lb

## 2013-05-27 DIAGNOSIS — R5381 Other malaise: Secondary | ICD-10-CM

## 2013-05-27 DIAGNOSIS — R5383 Other fatigue: Secondary | ICD-10-CM

## 2013-05-27 DIAGNOSIS — I4891 Unspecified atrial fibrillation: Secondary | ICD-10-CM

## 2013-05-27 DIAGNOSIS — I499 Cardiac arrhythmia, unspecified: Secondary | ICD-10-CM

## 2013-05-27 DIAGNOSIS — I455 Other specified heart block: Secondary | ICD-10-CM | POA: Insufficient documentation

## 2013-05-27 DIAGNOSIS — Z79899 Other long term (current) drug therapy: Secondary | ICD-10-CM

## 2013-05-27 DIAGNOSIS — D689 Coagulation defect, unspecified: Secondary | ICD-10-CM

## 2013-05-27 DIAGNOSIS — I495 Sick sinus syndrome: Secondary | ICD-10-CM

## 2013-05-27 DIAGNOSIS — I480 Paroxysmal atrial fibrillation: Secondary | ICD-10-CM

## 2013-05-27 NOTE — Progress Notes (Signed)
In office monitor placement. Verbal instructions given. Patient voiced understanding.

## 2013-05-27 NOTE — Progress Notes (Signed)
Patient ID: Diamond Miller, female   DOB: August 24, 1932, 80 y.o.   MRN: [id number]      Reason for office visit Discuss pacemaker implantation  Diamond Miller is a 80 year old woman with a recent left hemispheric embolic stroke who wore an event monitor that showed evidence of paroxysmal atrial fibrillation rapid ventricular response as well as post tachycardia sinus pauses of up to 7 seconds in duration. She is therefore referred for pacemaker implantation. She discussed this with Corine Shelter last week and has considered her options and wants to go ahead with the pacemaker procedure.  She also has biliary cirrhosis and a history of frequent falls. This makes her a less than optimal candidate for full anticoagulation. She is currently receiving clopidogrel for stroke prevention. The atrial for relation had not been diagnosed prior to her stroke. She is unaware of any palpitations. She does have a history of falls related to poor balance and arthritis. At least one fall had serious consequences with a mandibular fracture that required bone grafting.  Her head MRI from December 23 showed multiple sites of left hemispheric ischemic stroke as well as a small amount of hemorrhage within the posterior left lenticular nucleus. There was also evidence of remote small right cerebellar strokes and a larger right hemispheric stroke.  Echocardiography did not show any meaningful structural abnormalities. Carotid duplex ultrasound showed minimal plaque without significant obstruction   Allergies  Allergen Reactions  . Aspirin Other (See Comments)    Liver function  . Tylenol [Acetaminophen] Other (See Comments)    Liver function   . Penicillins Rash    Current Outpatient Prescriptions  Medication Sig Dispense Refill  . atorvastatin (LIPITOR) 40 MG tablet Take 1 tablet (40 mg total) by mouth daily at 6 PM.  30 tablet  0  . clopidogrel (PLAVIX) 75 MG tablet Take 1 tablet (75 mg total) by mouth daily  with breakfast.  30 tablet  0  . diazepam (VALIUM) 5 MG tablet Take 5 mg by mouth every 8 (eight) hours as needed for anxiety.       Marland Kitchen esomeprazole (NEXIUM) 40 MG capsule Take 40 mg by mouth 2 (two) times daily before a meal.       . glucosamine-chondroitin 500-400 MG tablet Take 1 tablet by mouth 3 (three) times daily.       . magnesium oxide (MAG-OX) 400 MG tablet Take 400 mg by mouth daily.        . metoprolol succinate (TOPROL-XL) 50 MG 24 hr tablet Take 25 mg by mouth daily. Take with or immediately following a meal.      . Misc. Devices (HEART RATE MONITOR) MISC 1 Units by Does not apply route as directed.  1 each  0  . Multiple Vitamin (MULITIVITAMIN WITH MINERALS) TABS Take 1 tablet by mouth daily.      Marland Kitchen oxyCODONE (OXY IR/ROXICODONE) 5 MG immediate release tablet Take 5 mg by mouth 3 (three) times daily as needed for moderate pain. For pain      . traZODone (DESYREL) 150 MG tablet Take 75 mg by mouth at bedtime.        No current facility-administered medications for this visit.    Past Medical History  Diagnosis Date  . Back pain Chronic back pain  . Scoliosis   . Liver cirrhosis   . Hypertension   . Acid reflux     Past Surgical History  Procedure Laterality Date  . Knee repalcement    . Replacement  total knee    . Eye surgery    . Tee without cardioversion N/A 05/17/2013    Procedure: TRANSESOPHAGEAL ECHOCARDIOGRAM (TEE);  Surgeon: Vesta MixerPhilip J Nahser, MD;  [facility];  Service: Cardiovascular;  Laterality: N/A;    Family History  Problem Relation Age of Onset  . Heart disease Mother   . Hypertension Sister   . Dementia Brother [id number]    History   Social History  . Marital Status: Widowed    Spouse Name: N/A    Number of Children: N/A  . Years of Education: N/A   Occupational History  . Not on file.   Social History Main Topics  . Smoking status: Former Games developermoker  . Smokeless tobacco: Not on file  . Alcohol Use: No  . Drug Use: No  . Sexual  Activity: Not on file   Other Topics Concern  . Not on file   Social History Narrative  . No narrative on file    Review of systems: The patient specifically denies any chest pain at rest or with exertion, dyspnea at rest or with exertion, orthopnea, paroxysmal nocturnal dyspnea, syncope, palpitations, focal neurological deficits, intermittent claudication, lower extremity edema, unexplained weight gain, cough, hemoptysis or wheezing.  The patient also denies abdominal pain, nausea, vomiting, dysphagia, diarrhea, constipation, polyuria, polydipsia, dysuria, hematuria, frequency, urgency, abnormal bleeding or bruising, fever, chills, unexpected weight changes, mood swings, change in skin or hair texture, change in voice quality, auditory or visual problems, allergic reactions or rashes, new musculoskeletal complaints other than usual "aches and pains".   PHYSICAL EXAM BP 118/80  Pulse 92  Resp 16  Ht 5' (1.524 m)  Wt 174 lb 9.6 oz (79.198 kg)  BMI 34.10 kg/m2  General: Alert, oriented x3, no distress Head: no evidence of trauma, PERRL, EOMI, no exophtalmos or lid lag, no myxedema, no xanthelasma; normal ears, nose and oropharynx Neck: normal jugular venous pulsations and no hepatojugular reflux; brisk carotid pulses without delay and no carotid bruits Chest: clear to auscultation, no signs of consolidation by percussion or palpation, normal fremitus, symmetrical and full respiratory excursions Cardiovascular: normal position and quality of the apical impulse, regular rhythm, normal first and second heart sounds, no murmurs, rubs or gallops Abdomen: no tenderness or distention, no masses by palpation, no abnormal pulsatility or arterial bruits, normal bowel sounds, no hepatosplenomegaly Extremities: no clubbing, cyanosis or edema; 2+ radial, ulnar and brachial pulses bilaterally; 2+ right femoral, posterior tibial and dorsalis pedis pulses; 2+ left femoral, posterior tibial and dorsalis  pedis pulses; no subclavian or femoral bruits Neurological: grossly nonfocal   EKG: NSR  Lipid Panel     Component Value Date/Time   CHOL 168 05/15/2013 0155   TRIG 123 05/15/2013 0155   HDL 38* 05/15/2013 0155   CHOLHDL 4.4 05/15/2013 0155   VLDL 25 05/15/2013 0155   LDLCALC 105* 05/15/2013 0155    BMET    Component Value Date/Time   NA 139 05/14/2013 0834   K 3.5 05/14/2013 0834   CL 102 05/14/2013 0834   CO2 27 05/14/2013 0825   GLUCOSE 113* 05/14/2013 0834   BUN 15 05/14/2013 0834   CREATININE 0.80 05/14/2013 0834   CALCIUM 9.2 05/14/2013 0825   GFRNONAA 78* 05/14/2013 0825   GFRAA >90 05/14/2013 0825     ASSESSMENT AND PLAN  Mrs. Sundra AlandFarrington clearly meets criteria for implantation of a dual-chamber permanent pacemaker for sinus pauses with in excess of 5 seconds in duration as well as tachycardia-bradycardia syndrome with  paroxysmal atrial fibrillation with rapid ventricular response.  The technical details of the need for pacemaker therapy, the implantation procedure, the potential complications and risks and benefits were discussed in detail. She wants to go ahead with pacemaker implantation.  A much more difficult question is that of long-term anticoagulation. She has had numerous ischemic strokes in multiple vascular distributions highly consistent with cardioembolic events are just confirmed atrial fibrillation. Unfortunately she is also high risk of bleeding complications secondary to her age and gait instability. The presence of a biliary cirrhosis also raises the risks of anticoagulants, especially the risk of warfarin. At this point in time I still think the risk-benefit ratio is in favor of anticoagulations for now recommend using a novel agent such as Eliquis or Xarelto. We'll discuss this in more detail after pacemaker implantation. She is wary of any blood thinners.  Orders Placed This Encounter  Procedures  . CBC  . Protime-INR  . APTT  . Comprehensive  metabolic panel  . EKG 12-Lead  . PERMANENT PACEMAKER INSERTION   No orders of the defined types were placed in this encounter.    Junious SilkROITORU,Jaretzi Droz  Alson Mcpheeters, MD, [facility] [phone] office [phone] pager

## 2013-05-27 NOTE — Patient Instructions (Signed)
Your physician has recommended that you have a pacemaker inserted. A pacemaker is a small device that is placed under the skin of your chest or abdomen to help control abnormal heart rhythms. This device uses electrical pulses to prompt the heart to beat at a normal rate. Pacemakers are used to treat heart rhythms that are too slow. Wire (leads) are attached to the pacemaker that goes into the chambers of you heart. This is done in the hospital and usually requires and overnight stay. Please see the instruction sheet given to you today for more information.  Your physician recommends that you return for lab work in: 5-7 days prior to the pacemaker insertion.

## 2013-05-31 ENCOUNTER — Other Ambulatory Visit: Payer: Self-pay | Admitting: *Deleted

## 2013-05-31 DIAGNOSIS — I495 Sick sinus syndrome: Secondary | ICD-10-CM

## 2013-06-01 HISTORY — PX: PACEMAKER INSERTION: SHX728

## 2013-06-04 LAB — COMPREHENSIVE METABOLIC PANEL
ALBUMIN: 3.8 g/dL (ref 3.5–5.2)
ALK PHOS: 133 U/L — AB (ref 39–117)
ALT: 11 U/L (ref 0–35)
AST: 23 U/L (ref 0–37)
BILIRUBIN TOTAL: 0.5 mg/dL (ref 0.3–1.2)
BUN: 10 mg/dL (ref 6–23)
CO2: 30 mEq/L (ref 19–32)
Calcium: 9.6 mg/dL (ref 8.4–10.5)
Chloride: 96 mEq/L (ref 96–112)
Creat: 0.77 mg/dL (ref 0.50–1.10)
GLUCOSE: 102 mg/dL — AB (ref 70–99)
POTASSIUM: 4.3 meq/L (ref 3.5–5.3)
Sodium: 134 mEq/L — ABNORMAL LOW (ref 135–145)
Total Protein: 7 g/dL (ref 6.0–8.3)

## 2013-06-04 LAB — CBC
HEMATOCRIT: 38.7 % (ref 36.0–46.0)
HEMOGLOBIN: 13.3 g/dL (ref 12.0–15.0)
MCH: 29.5 pg (ref 26.0–34.0)
MCHC: 34.4 g/dL (ref 30.0–36.0)
MCV: 85.8 fL (ref 78.0–100.0)
Platelets: 308 10*3/uL (ref 150–400)
RBC: 4.51 MIL/uL (ref 3.87–5.11)
RDW: 13 % (ref 11.5–15.5)
WBC: 8.1 10*3/uL (ref 4.0–10.5)

## 2013-06-04 LAB — APTT: APTT: 30 s (ref 24–37)

## 2013-06-04 LAB — PROTIME-INR
INR: 1.03 (ref <1.50)
Prothrombin Time: 13.4 seconds (ref 11.6–15.2)

## 2013-06-05 ENCOUNTER — Ambulatory Visit
Admission: RE | Admit: 2013-06-05 | Discharge: 2013-06-05 | Disposition: A | Discharge status: Home / Self Care | Payer: Medicare Other | Source: Ambulatory Visit | Attending: Cardiovascular Disease | Admitting: Cardiovascular Disease

## 2013-06-05 ENCOUNTER — Telehealth: Payer: Self-pay | Admitting: Cardiovascular Disease

## 2013-06-05 DIAGNOSIS — I495 Sick sinus syndrome: Secondary | ICD-10-CM

## 2013-06-05 DIAGNOSIS — Z01818 Encounter for other preprocedural examination: Secondary | ICD-10-CM

## 2013-06-05 NOTE — Telephone Encounter (Signed)
Patient is there now. [facility]  Not finding an order in system.  Please call or fax order to [phone] .

## 2013-06-05 NOTE — Telephone Encounter (Signed)
Pt is there ,still waiting for an order for her xray.

## 2013-06-05 NOTE — Telephone Encounter (Signed)
Spoke to RUBY. RN INFORMED HER THAT AN ORDERED HAS BEEN PLACED IN [facility]  SHE IS AWARE

## 2013-06-10 MED ORDER — VANCOMYCIN HCL IN DEXTROSE 1-5 GM/200ML-% IV SOLN
1000.0000 mg | INTRAVENOUS | Status: DC
  Filled 2013-06-10: qty 200

## 2013-06-10 MED ORDER — SODIUM CHLORIDE 0.9 % IR SOLN
80.0000 mg | Status: DC
  Filled 2013-06-10: qty 2

## 2013-06-11 ENCOUNTER — Encounter (HOSPITAL_COMMUNITY)
Admission: RE | Disposition: A | Discharge status: Home / Self Care | Payer: Self-pay | Source: Ambulatory Visit | Attending: Cardiovascular Disease

## 2013-06-11 ENCOUNTER — Ambulatory Visit (HOSPITAL_COMMUNITY)
Admission: RE | Admit: 2013-06-11 | Discharge: 2013-06-12 | Disposition: A | Discharge status: Home / Self Care | Payer: Medicare Other | Source: Ambulatory Visit | Attending: Cardiovascular Disease | Admitting: Cardiovascular Disease

## 2013-06-11 DIAGNOSIS — Z950 Presence of cardiac pacemaker: Secondary | ICD-10-CM

## 2013-06-11 DIAGNOSIS — I480 Paroxysmal atrial fibrillation: Secondary | ICD-10-CM | POA: Diagnosis present

## 2013-06-11 DIAGNOSIS — I495 Sick sinus syndrome: Secondary | ICD-10-CM | POA: Diagnosis present

## 2013-06-11 DIAGNOSIS — I455 Other specified heart block: Secondary | ICD-10-CM

## 2013-06-11 HISTORY — DX: Pure hypercholesterolemia, unspecified: E78.00

## 2013-06-11 HISTORY — DX: Anemia, unspecified: D64.9

## 2013-06-11 HISTORY — DX: Anxiety disorder, unspecified: F41.9

## 2013-06-11 HISTORY — DX: Chronic obstructive pulmonary disease, unspecified: J44.9

## 2013-06-11 HISTORY — PX: INSERT / REPLACE / REMOVE PACEMAKER: SUR710

## 2013-06-11 HISTORY — DX: Presence of cardiac pacemaker: Z95.0

## 2013-06-11 HISTORY — PX: PERMANENT PACEMAKER INSERTION: SHX5480

## 2013-06-11 HISTORY — DX: Personal history of other medical treatment: Z92.89

## 2013-06-11 HISTORY — DX: Dorsalgia, unspecified: M54.9

## 2013-06-11 HISTORY — DX: Cerebral infarction, unspecified: I63.9

## 2013-06-11 HISTORY — DX: Other cervical disc degeneration, unspecified cervical region: M50.30

## 2013-06-11 HISTORY — DX: Other chronic pain: G89.29

## 2013-06-11 HISTORY — DX: Cardiac arrhythmia, unspecified: I49.9

## 2013-06-11 HISTORY — DX: Shortness of breath: R06.02

## 2013-06-11 LAB — SURGICAL PCR SCREEN
MRSA, PCR: NEGATIVE
STAPHYLOCOCCUS AUREUS: POSITIVE — AB

## 2013-06-11 SURGERY — PERMANENT PACEMAKER INSERTION
Anesthesia: LOCAL

## 2013-06-11 MED ORDER — ONDANSETRON HCL 4 MG/2ML IJ SOLN: 4.0000 mg | Freq: Four times a day (QID) | INTRAMUSCULAR | Status: DC | PRN

## 2013-06-11 MED ORDER — MAGNESIUM OXIDE 400 (241.3 MG) MG PO TABS
400.0000 mg | ORAL_TABLET | Freq: Every day | ORAL | Status: DC
  Administered 2013-06-11 – 2013-06-12 (×2): 400 mg via ORAL
  Filled 2013-06-11 (×2): qty 1

## 2013-06-11 MED ORDER — FENTANYL CITRATE 0.05 MG/ML IJ SOLN
INTRAMUSCULAR | Status: AC
Start: 2013-06-11 — End: 2013-06-11
  Filled 2013-06-11: qty 2

## 2013-06-11 MED ORDER — SODIUM CHLORIDE 0.9 % IV SOLN: INTRAVENOUS | Status: AC

## 2013-06-11 MED ORDER — CLOPIDOGREL BISULFATE 75 MG PO TABS
75.0000 mg | ORAL_TABLET | Freq: Every day | ORAL | Status: DC
  Administered 2013-06-12: 75 mg via ORAL
  Filled 2013-06-11: qty 1

## 2013-06-11 MED ORDER — LIDOCAINE HCL (PF) 1 % IJ SOLN
INTRAMUSCULAR | Status: AC
  Filled 2013-06-11: qty 60

## 2013-06-11 MED ORDER — METOPROLOL SUCCINATE ER 25 MG PO TB24
25.0000 mg | ORAL_TABLET | Freq: Every day | ORAL | Status: DC
  Administered 2013-06-11 – 2013-06-12 (×2): 25 mg via ORAL
  Filled 2013-06-11 (×2): qty 1

## 2013-06-11 MED ORDER — HEPARIN (PORCINE) IN NACL 2-0.9 UNIT/ML-% IJ SOLN
INTRAMUSCULAR | Status: AC
  Filled 2013-06-11: qty 1000

## 2013-06-11 MED ORDER — MUPIROCIN 2 % EX OINT
TOPICAL_OINTMENT | CUTANEOUS | Status: AC
  Administered 2013-06-11: 1 via NASAL
  Filled 2013-06-11: qty 22

## 2013-06-11 MED ORDER — SODIUM CHLORIDE 0.9 % IV SOLN
INTRAVENOUS | Status: DC
  Administered 2013-06-11: 14:00:00 via INTRAVENOUS

## 2013-06-11 MED ORDER — MIDAZOLAM HCL 5 MG/5ML IJ SOLN
INTRAMUSCULAR | Status: AC
  Filled 2013-06-11: qty 5

## 2013-06-11 MED ORDER — DIAZEPAM 5 MG PO TABS: 5.0000 mg | ORAL_TABLET | Freq: Three times a day (TID) | ORAL | Status: DC | PRN

## 2013-06-11 MED ORDER — PANTOPRAZOLE SODIUM 40 MG PO TBEC
40.0000 mg | DELAYED_RELEASE_TABLET | Freq: Every day | ORAL | Status: DC
  Administered 2013-06-11 – 2013-06-12 (×2): 40 mg via ORAL
  Filled 2013-06-11 (×2): qty 1

## 2013-06-11 MED ORDER — TRAZODONE HCL 50 MG PO TABS
75.0000 mg | ORAL_TABLET | Freq: Every day | ORAL | Status: DC
  Administered 2013-06-11: 75 mg via ORAL
  Filled 2013-06-11 (×2): qty 1

## 2013-06-11 MED ORDER — ACETAMINOPHEN 325 MG PO TABS: 325.0000 mg | ORAL_TABLET | ORAL | Status: DC | PRN

## 2013-06-11 MED ORDER — MAGNESIUM OXIDE 400 MG PO TABS: 400.0000 mg | ORAL_TABLET | Freq: Every day | ORAL | Status: DC

## 2013-06-11 MED ORDER — SODIUM CHLORIDE 0.9 % IJ SOLN: 3.0000 mL | INTRAMUSCULAR | Status: DC | PRN

## 2013-06-11 MED ORDER — VANCOMYCIN HCL IN DEXTROSE 1-5 GM/200ML-% IV SOLN
1000.0000 mg | Freq: Two times a day (BID) | INTRAVENOUS | Status: AC
  Administered 2013-06-12: 1000 mg via INTRAVENOUS
  Filled 2013-06-11: qty 200

## 2013-06-11 MED ORDER — ATORVASTATIN CALCIUM 40 MG PO TABS
40.0000 mg | ORAL_TABLET | Freq: Every day | ORAL | Status: DC
  Administered 2013-06-11: 40 mg via ORAL
  Filled 2013-06-11 (×2): qty 1

## 2013-06-11 MED ORDER — MUPIROCIN 2 % EX OINT
TOPICAL_OINTMENT | Freq: Two times a day (BID) | CUTANEOUS | Status: DC
  Administered 2013-06-11: 22:00:00 via NASAL
  Administered 2013-06-11: 1 via NASAL
  Administered 2013-06-12: 10:00:00 via NASAL
  Filled 2013-06-11 (×2): qty 22

## 2013-06-11 MED ORDER — OXYCODONE HCL 5 MG PO TABS
5.0000 mg | ORAL_TABLET | Freq: Three times a day (TID) | ORAL | Status: DC | PRN
  Administered 2013-06-11 – 2013-06-12 (×2): 5 mg via ORAL
  Filled 2013-06-11 (×2): qty 1

## 2013-06-11 MED FILL — Gentamicin Sulfate Inj 40 MG/ML: INTRAMUSCULAR | Qty: 2 | Status: AC

## 2013-06-11 MED FILL — Vancomycin HCl-Dextrose IV Soln 1 GM/200ML-5%: INTRAVENOUS | Qty: 200 | Status: AC

## 2013-06-11 MED FILL — Vancomycin HCl For IV Soln 1 GM (Base Equivalent): INTRAVENOUS | Qty: 1000 | Status: AC

## 2013-06-11 MED FILL — Sodium Chloride Irrigation Soln 0.9%: Qty: 500 | Status: AC

## 2013-06-11 NOTE — H&P (View-Only) (Signed)
Patient ID: Diamond Miller, female   DOB: August 24, 1932, 80 y.o.   MRN: [id number]      Reason for office visit Discuss pacemaker implantation  Diamond Miller is a 80 year old woman with a recent left hemispheric embolic stroke who wore an event monitor that showed evidence of paroxysmal atrial fibrillation rapid ventricular response as well as post tachycardia sinus pauses of up to 7 seconds in duration. She is therefore referred for pacemaker implantation. She discussed this with Corine Shelter last week and has considered her options and wants to go ahead with the pacemaker procedure.  She also has biliary cirrhosis and a history of frequent falls. This makes her a less than optimal candidate for full anticoagulation. She is currently receiving clopidogrel for stroke prevention. The atrial for relation had not been diagnosed prior to her stroke. She is unaware of any palpitations. She does have a history of falls related to poor balance and arthritis. At least one fall had serious consequences with a mandibular fracture that required bone grafting.  Her head MRI from December 23 showed multiple sites of left hemispheric ischemic stroke as well as a small amount of hemorrhage within the posterior left lenticular nucleus. There was also evidence of remote small right cerebellar strokes and a larger right hemispheric stroke.  Echocardiography did not show any meaningful structural abnormalities. Carotid duplex ultrasound showed minimal plaque without significant obstruction   Allergies  Allergen Reactions  . Aspirin Other (See Comments)    Liver function  . Tylenol [Acetaminophen] Other (See Comments)    Liver function   . Penicillins Rash    Current Outpatient Prescriptions  Medication Sig Dispense Refill  . atorvastatin (LIPITOR) 40 MG tablet Take 1 tablet (40 mg total) by mouth daily at 6 PM.  30 tablet  0  . clopidogrel (PLAVIX) 75 MG tablet Take 1 tablet (75 mg total) by mouth daily  with breakfast.  30 tablet  0  . diazepam (VALIUM) 5 MG tablet Take 5 mg by mouth every 8 (eight) hours as needed for anxiety.       Marland Kitchen esomeprazole (NEXIUM) 40 MG capsule Take 40 mg by mouth 2 (two) times daily before a meal.       . glucosamine-chondroitin 500-400 MG tablet Take 1 tablet by mouth 3 (three) times daily.       . magnesium oxide (MAG-OX) 400 MG tablet Take 400 mg by mouth daily.        . metoprolol succinate (TOPROL-XL) 50 MG 24 hr tablet Take 25 mg by mouth daily. Take with or immediately following a meal.      . Misc. Devices (HEART RATE MONITOR) MISC 1 Units by Does not apply route as directed.  1 each  0  . Multiple Vitamin (MULITIVITAMIN WITH MINERALS) TABS Take 1 tablet by mouth daily.      Marland Kitchen oxyCODONE (OXY IR/ROXICODONE) 5 MG immediate release tablet Take 5 mg by mouth 3 (three) times daily as needed for moderate pain. For pain      . traZODone (DESYREL) 150 MG tablet Take 75 mg by mouth at bedtime.        No current facility-administered medications for this visit.    Past Medical History  Diagnosis Date  . Back pain Chronic back pain  . Scoliosis   . Liver cirrhosis   . Hypertension   . Acid reflux     Past Surgical History  Procedure Laterality Date  . Knee repalcement    . Replacement  total knee    . Eye surgery    . Tee without cardioversion N/A 05/17/2013    Procedure: TRANSESOPHAGEAL ECHOCARDIOGRAM (TEE);  Surgeon: Vesta MixerPhilip J Nahser, MD;  [facility];  Service: Cardiovascular;  Laterality: N/A;    Family History  Problem Relation Age of Onset  . Heart disease Mother   . Hypertension Sister   . Dementia Brother [id number]    History   Social History  . Marital Status: Widowed    Spouse Name: N/A    Number of Children: N/A  . Years of Education: N/A   Occupational History  . Not on file.   Social History Main Topics  . Smoking status: Former Games developermoker  . Smokeless tobacco: Not on file  . Alcohol Use: No  . Drug Use: No  . Sexual  Activity: Not on file   Other Topics Concern  . Not on file   Social History Narrative  . No narrative on file    Review of systems: The patient specifically denies any chest pain at rest or with exertion, dyspnea at rest or with exertion, orthopnea, paroxysmal nocturnal dyspnea, syncope, palpitations, focal neurological deficits, intermittent claudication, lower extremity edema, unexplained weight gain, cough, hemoptysis or wheezing.  The patient also denies abdominal pain, nausea, vomiting, dysphagia, diarrhea, constipation, polyuria, polydipsia, dysuria, hematuria, frequency, urgency, abnormal bleeding or bruising, fever, chills, unexpected weight changes, mood swings, change in skin or hair texture, change in voice quality, auditory or visual problems, allergic reactions or rashes, new musculoskeletal complaints other than usual "aches and pains".   PHYSICAL EXAM BP 118/80  Pulse 92  Resp 16  Ht 5' (1.524 m)  Wt 174 lb 9.6 oz (79.198 kg)  BMI 34.10 kg/m2  General: Alert, oriented x3, no distress Head: no evidence of trauma, PERRL, EOMI, no exophtalmos or lid lag, no myxedema, no xanthelasma; normal ears, nose and oropharynx Neck: normal jugular venous pulsations and no hepatojugular reflux; brisk carotid pulses without delay and no carotid bruits Chest: clear to auscultation, no signs of consolidation by percussion or palpation, normal fremitus, symmetrical and full respiratory excursions Cardiovascular: normal position and quality of the apical impulse, regular rhythm, normal first and second heart sounds, no murmurs, rubs or gallops Abdomen: no tenderness or distention, no masses by palpation, no abnormal pulsatility or arterial bruits, normal bowel sounds, no hepatosplenomegaly Extremities: no clubbing, cyanosis or edema; 2+ radial, ulnar and brachial pulses bilaterally; 2+ right femoral, posterior tibial and dorsalis pedis pulses; 2+ left femoral, posterior tibial and dorsalis  pedis pulses; no subclavian or femoral bruits Neurological: grossly nonfocal   EKG: NSR  Lipid Panel     Component Value Date/Time   CHOL 168 05/15/2013 0155   TRIG 123 05/15/2013 0155   HDL 38* 05/15/2013 0155   CHOLHDL 4.4 05/15/2013 0155   VLDL 25 05/15/2013 0155   LDLCALC 105* 05/15/2013 0155    BMET    Component Value Date/Time   NA 139 05/14/2013 0834   K 3.5 05/14/2013 0834   CL 102 05/14/2013 0834   CO2 27 05/14/2013 0825   GLUCOSE 113* 05/14/2013 0834   BUN 15 05/14/2013 0834   CREATININE 0.80 05/14/2013 0834   CALCIUM 9.2 05/14/2013 0825   GFRNONAA 78* 05/14/2013 0825   GFRAA >90 05/14/2013 0825     ASSESSMENT AND PLAN  Diamond Miller clearly meets criteria for implantation of a dual-chamber permanent pacemaker for sinus pauses with in excess of 5 seconds in duration as well as tachycardia-bradycardia syndrome with  paroxysmal atrial fibrillation with rapid ventricular response.  The technical details of the need for pacemaker therapy, the implantation procedure, the potential complications and risks and benefits were discussed in detail. She wants to go ahead with pacemaker implantation.  A much more difficult question is that of long-term anticoagulation. She has had numerous ischemic strokes in multiple vascular distributions highly consistent with cardioembolic events are just confirmed atrial fibrillation. Unfortunately she is also high risk of bleeding complications secondary to her age and gait instability. The presence of a biliary cirrhosis also raises the risks of anticoagulants, especially the risk of warfarin. At this point in time I still think the risk-benefit ratio is in favor of anticoagulations for now recommend using a novel agent such as Eliquis or Xarelto. We'll discuss this in more detail after pacemaker implantation. She is wary of any blood thinners.  Orders Placed This Encounter  Procedures  . CBC  . Protime-INR  . APTT  . Comprehensive  metabolic panel  . EKG 12-Lead  . PERMANENT PACEMAKER INSERTION   No orders of the defined types were placed in this encounter.    Junious SilkROITORU,Shakeela Rabadan  Tiffay Pinette, MD, [facility] [phone] office [phone] pager

## 2013-06-11 NOTE — Interval H&P Note (Signed)
History and Physical Interval Note:  06/11/2013 3:36 PM  Diamond Miller  has presented today for surgery, with the diagnosis of bradicardia  The various methods of treatment have been discussed with the patient and family. After consideration of risks, benefits and other options for treatment, the patient has consented to  Procedure(s): PERMANENT PACEMAKER INSERTION (N/A) as a surgical intervention .  The patient's history has been reviewed, patient examined, no change in status, stable for surgery.  I have reviewed the patient's chart and labs.  Questions were answered to the patient's satisfaction.     Brittinie Wherley

## 2013-06-11 NOTE — CV Procedure (Signed)
Procedure report  Procedure performed:  1. Implantation of new dual chamber permanent pacemaker 2. Fluoroscopy 3. Light sedation  Reason for procedure: Symptomatic bradycardia due to: Sinus node dysfunction Sinus arrest Tachycardia-bradycardia syndrome Bradycardia due to necessary medications (for PAF)  Procedure performed by: Thurmon FairMihai Morrill Bomkamp, MD  Complications: None  Estimated blood loss: <10 mL  Medications administered during procedure: Vancomycin 1 g intravenously Lidocaine 1% 30 mL locally,  Fentanyl 100 mcg intravenously Versed 4 mg intravenously  Device details: Generator Medtronic Advisa model [id number] serial number [id number] H Right atrial lead Medtronic [id number] serial number PJN [id number] Right ventricular lead Medtronic [id number] serial number [id number]  Procedure details:  After the risks and benefits of the procedure were discussed the patient provided informed consent and was brought to the cardiac cath lab in the fasting state. The patient was prepped and draped in usual sterile fashion. Local anesthesia with 1% lidocaine was administered to to the left infraclavicular area. A 5-6 cm horizontal incision was made parallel with and 2-3 cm caudal to the left clavicle. Using electrocautery and blunt dissection a prepectoral pocket was created down to the level of the pectoralis major muscle fascia. The pocket was carefully inspected for hemostasis. An antibiotic-soaked sponge was placed in the pocket.  Under fluoroscopic guidance and using the modified Seldinger technique 2 separate venipunctures were performed to access the left subclavian vein. No difficulty was encountered accessing the vein.  Two J-tip guidewires were subsequently exchanged for two 7 French safe sheaths.  Under fluoroscopic guidance the ventricular lead was advanced to level of the mid to apical right ventricular septum and thet active-fixation helix was deployed. Prominent current of injury was seen.  Satisfactory pacing and sensing parameters were recorded. There was no evidence of diaphragmatic stimulation at maximum device output. The safe sheath was peeled away and the lead was secured in place with 2-0 silk.  In similar fashion the right atrial lead was advanced to the level of the atrial appendage. The active-fixation helix was deployed. There was prominent current of injury. Satisfactory  pacing and sensing parameters were recorded. There was no evidence of diaphragmatic stimulation with pacing at maximum device output. The safe sheath was peeled away and the lead was secured in place with 2-0 silk.  The antibiotic-soaked sponge was removed from the pocket. The pocket was flushed with copious amounts of antibiotic solution. Reinspection showed excellent hemostasis..  The ventricular lead was connected to the generator and appropriate ventricular pacing was seen. Subsequently the atrial lead was also connected. Repeat testing of the lead parameters later showed excellent values.  The entire system was then carefully inserted in the pocket with care been taking that the leads and device assumed a comfortable position without pressure on the incision. Great care was taken that the leads be located deep to the generator. The pocket was then closed in layers using 2 layers of 2-0 Vicryl and cutaneous staples, after which a sterile dressing was applied.  At the end of the procedure the following lead parameters were encountered:  Right atrial lead  sensed P waves 1.9 mV, impedance 665 ohms, threshold 0.75 V at 0.4 ms pulse width.  Right ventricular lead sensed R waves 15.1 mV, impedance 703 ohms, threshold 0.75 V at 0.4 ms pulse width.  Thurmon FairMihai Abdalla Naramore, MD, [facility] [phone] office [phone] pager

## 2013-06-12 ENCOUNTER — Ambulatory Visit (HOSPITAL_COMMUNITY): Payer: Medicare Other | Attending: Cardiovascular Disease

## 2013-06-12 ENCOUNTER — Encounter (HOSPITAL_COMMUNITY): Payer: Self-pay | Admitting: General Practice

## 2013-06-12 DIAGNOSIS — I498 Other specified cardiac arrhythmias: Secondary | ICD-10-CM | POA: Insufficient documentation

## 2013-06-12 DIAGNOSIS — I495 Sick sinus syndrome: Secondary | ICD-10-CM

## 2013-06-12 MED ORDER — CLOPIDOGREL BISULFATE 75 MG PO TABS: 75.0000 mg | ORAL_TABLET | Freq: Every day | ORAL | Status: DC

## 2013-06-12 MED ORDER — ATORVASTATIN CALCIUM 40 MG PO TABS: 40.0000 mg | ORAL_TABLET | Freq: Every day | ORAL | Status: DC

## 2013-06-12 NOTE — Discharge Summary (Signed)
Physician Discharge Summary  Patient ID: Diamond Miller MRN: [id number] DOB/AGE: [birth year] 80 y.o.  Admit date: 06/11/2013 Discharge date: 06/12/2013  Admission Diagnoses: PAF/ Tachy-brady syndrome  Discharge Diagnoses:  Active Problems:   Tachy-brady syndrome   Paroxysmal atrial fibrillation   Pacemaker   Discharged Condition: stable  Hospital Course: Diamond Miller is a 80 year old woman with a recent left hemispheric embolic stroke who wore an event monitor that showed evidence of paroxysmal atrial fibrillation with rapid ventricular response, as well as post tachycardia sinus pauses of up to 7 seconds in duration. She was referred for pacemaker implantation. [facility]13 for the procedure. It was performed by Dr. Royann Shiversroitoru. She underwent successful implantation of a Medtronic dual chamber PPM. She tolerated the procedure well. She had no post op complications. She denied chest pain or shortness of breath. Post-operative CXR demonstrated proper lead placement and no evidence of pneumothorax. Device interrogation demonstrated normal functioning. She was last seen and examined by Dr. Graciela Miller, who determined she was stable for discharge home. She was provided instructions on proper wound care and activity restrictions. She will follow-up with Nada BoozerLaura Ingold, NP, in 7-10 days for wound check and staple removal.    Consults: None  Significant Diagnostic Studies:   Treatments:   PPM insertion 06/01/13 Device details:  Generator Medtronic Advisa model A2DR01 serial number [id number]  Right atrial lead Medtronic [id number] serial number [id number]  Right ventricular lead Medtronic [id number] serial number [id number]  Discharge Exam: Blood pressure 134/79, pulse 69, temperature 98 F (36.7 C), temperature source Oral, resp. rate 17, height 5\' 1"  (1.549 m), weight 172 lb 11.2 oz (78.336 kg), SpO2 98.00%.   Disposition: 06-Home-Health Care Svc      Discharge Orders   Future Appointments Provider Department Dept Phone   06/21/2013 9:00 AM Nada BoozerLaura Ingold, NP [facility] [phone]   Future Orders Complete By Expires   Diet - low sodium heart healthy  As directed    Increase activity slowly  As directed        Medication List    STOP taking these medications       Heart Rate Monitor Misc      TAKE these medications       atorvastatin 40 MG tablet  Commonly known as:  LIPITOR  Take 1 tablet (40 mg total) by mouth daily at 6 PM.     clopidogrel 75 MG tablet  Commonly known as:  PLAVIX  Take 1 tablet (75 mg total) by mouth daily with breakfast.     diazepam 5 MG tablet  Commonly known as:  VALIUM  Take 5 mg by mouth every 8 (eight) hours as needed for anxiety.     esomeprazole 40 MG capsule  Commonly known as:  NEXIUM  Take 40 mg by mouth 2 (two) times daily before a meal.     glucosamine-chondroitin 500-400 MG tablet  Take 1 tablet by mouth 3 (three) times daily.     magnesium oxide 400 MG tablet  Commonly known as:  MAG-OX  Take 400 mg by mouth daily.     metoprolol succinate 50 MG 24 hr tablet  Commonly known as:  TOPROL-XL  Take 25 mg by mouth daily. Take with or immediately following a meal.     multivitamin with minerals Tabs tablet  Take 1 tablet by mouth daily.     oxyCODONE 5 MG immediate release tablet  Commonly known as:  Oxy IR/ROXICODONE  Take 5 mg by  mouth 3 (three) times daily as needed for moderate pain. For pain     traZODone 150 MG tablet  Commonly known as:  DESYREL  Take 75 mg by mouth at bedtime.       Follow-up Information   Follow up with [facility], NP On 06/21/2013. (9:00 am )    Specialty:  Cardiology   Contact information:   [address] [place] [address] [phone]      TIME SPENT ON DISCHARGE, INCLUDING PHYSICIAN TIME: >30 MINUTES  Signed: Robbie Lis 06/12/2013, 11:14 AM

## 2013-06-12 NOTE — Discharge Instructions (Signed)

## 2013-06-12 NOTE — Progress Notes (Signed)
[facility]  Patient Status: Active (receiving services up to time of hospitalization)  [facility] is providing the following services: RN, PT and MSW  If patient discharges after hours, please call [phone].   Diamond FurnishDonna Miller 06/12/2013, 9:52 AM

## 2013-06-12 NOTE — Progress Notes (Signed)
Subjective: With mild incisional soreness but denies CP/SOB.   Objective: Vital signs in last 24 hours: Temp:  [97.3 F (36.3 C)-98.2 F (36.8 C)] 98.2 F (36.8 C) (01/21 0400) Pulse Rate:  [64-81] 73 (01/21 0400) Resp:  [18] 18 (01/20 1818) BP: (136-175)/(68-82) 136/80 mmHg (01/21 0400) SpO2:  [98 %-100 %] 98 % (01/21 0400) Weight:  [172 lb 11.2 oz (78.336 kg)-174 lb 9.6 oz (79.198 kg)] 172 lb 11.2 oz (78.336 kg) (01/21 0439) Last BM Date: 06/11/13  Intake/Output from previous day:   Intake/Output this shift:    Medications Current Facility-Administered Medications  Medication Dose Route Frequency Provider Last Rate Last Dose  . acetaminophen (TYLENOL) tablet 325-650 mg  325-650 mg Oral Q4H PRN Mihai Croitoru, MD      . atorvastatin (LIPITOR) tablet 40 mg  40 mg Oral q1800 Mihai Croitoru, MD   40 mg at 06/11/13 2104  . clopidogrel (PLAVIX) tablet 75 mg  75 mg Oral Q breakfast Mihai Croitoru, MD      . diazepam (VALIUM) tablet 5 mg  5 mg Oral Q8H PRN Mihai Croitoru, MD      . magnesium oxide (MAG-OX) tablet 400 mg  400 mg Oral Daily Mihai Croitoru, MD   400 mg at 06/11/13 2104  . metoprolol succinate (TOPROL-XL) 24 hr tablet 25 mg  25 mg Oral Daily Mihai Croitoru, MD   25 mg at 06/11/13 2104  . mupirocin ointment (BACTROBAN) 2 %   Nasal BID Mihai Croitoru, MD      . ondansetron (ZOFRAN) injection 4 mg  4 mg Intravenous Q6H PRN Mihai Croitoru, MD      . oxyCODONE (Oxy IR/ROXICODONE) immediate release tablet 5 mg  5 mg Oral TID PRN Thurmon FairMihai Croitoru, MD   5 mg at 06/12/13 0507  . pantoprazole (PROTONIX) EC tablet 40 mg  40 mg Oral Daily Mihai Croitoru, MD   40 mg at 06/11/13 2234  . traZODone (DESYREL) tablet 75 mg  75 mg Oral QHS Thurmon FairMihai Croitoru, MD   75 mg at 06/11/13 2234    PE: General appearance: alert, cooperative and no distress Lungs: clear to auscultation bilaterally Heart: regular rate and rhythm Extremities: no LEE Pulses: 2+ and symmetric Skin: warm and  dry Neurologic: Grossly normal  Lab Results:  No results found for this basename: WBC, HGB, HCT, PLT,  in the last 72 hours BMET No results found for this basename: NA, K, CL, CO2, GLUCOSE, BUN, CREATININE, CALCIUM,  in the last 72 hours  Studies/Results:  Post-Op CXR 06/12/13 EXAM: CHEST 2 VIEW  COMPARISON: 06/05/2013.  FINDINGS: Interim placement of pacemaker. Lead tips noted over the right atrium and right ventricle. Lungs are clear. No pneumothorax. Stable cardiomegaly. No acute osseous abnormality. Postsurgical changes noted about the mandible.  IMPRESSION: Interim placement of pacemaker. Lead tips are noted in the right atrium and right ventricle. No acute cardiopulmonary disease.  Assessment/Plan  Active Problems:   Pacemaker  Plan: Day 1 s/p dual chamber PPM insertion for tachy-brady syndrome/ symptomatic bradycardia. PPM is a Medtronic device. No CP/SOB. Post-operative CXR demonstrates proper lead placement and no evidence of pneumothorax.  Left arm restricted in arm sling to prevent lead dislodgement.  Device interrogation today revealed normal functioning. NSR on telemetry. HR in the 70s. Plan for discharge later today. Will need wound check in 7-10 days with APP and a 30 day pacer check with Dr. Royann Shiversroitoru.     LOS: 1 day    Brittainy M. Sharol HarnessSimmons, PA-C 06/12/2013  7:49 AM  insturction reviewed  Needs refills

## 2013-06-18 ENCOUNTER — Telehealth: Payer: Self-pay | Admitting: Cardiovascular Disease

## 2013-06-18 NOTE — Telephone Encounter (Signed)
Shaunda, PT w/ [facility] called back.  Stated she needs VO for OT evaluation and treatment.  Informed Dr. Royann Shiversroitoru has given orders.  Verbalized understanding.

## 2013-06-18 NOTE — Telephone Encounter (Signed)
She need a verbal order for occupational therapy. She also want to notify you that the pt fell when she came home from the hospital,there was no injury.

## 2013-06-18 NOTE — Telephone Encounter (Signed)
Returned call.  Left message to call back before 4pm.  

## 2013-06-18 NOTE — Telephone Encounter (Signed)
Message forwarded to Dr. Ellwood Handlerroitoru/Barbara, CMA.  Pt was discharged on 1.21.15.

## 2013-06-18 NOTE — Telephone Encounter (Signed)
Go ahead with OT

## 2013-06-21 ENCOUNTER — Encounter: Payer: Self-pay | Admitting: Cardiology

## 2013-06-21 ENCOUNTER — Ambulatory Visit (INDEPENDENT_AMBULATORY_CARE_PROVIDER_SITE_OTHER): Payer: Medicare Other | Admitting: Cardiology

## 2013-06-21 VITALS — BP 160/80 | HR 83 | Ht <= 58 in | Wt 168.7 lb

## 2013-06-21 DIAGNOSIS — Z950 Presence of cardiac pacemaker: Secondary | ICD-10-CM

## 2013-06-21 DIAGNOSIS — I480 Paroxysmal atrial fibrillation: Secondary | ICD-10-CM

## 2013-06-21 DIAGNOSIS — I4891 Unspecified atrial fibrillation: Secondary | ICD-10-CM

## 2013-06-21 DIAGNOSIS — I6350 Cerebral infarction due to unspecified occlusion or stenosis of unspecified cerebral artery: Secondary | ICD-10-CM

## 2013-06-21 DIAGNOSIS — I639 Cerebral infarction, unspecified: Secondary | ICD-10-CM

## 2013-06-21 DIAGNOSIS — I495 Sick sinus syndrome: Secondary | ICD-10-CM

## 2013-06-21 NOTE — Patient Instructions (Signed)
May shower and pat pacemaker site dry.  You do not have to wear the sling.  Do not raise Lt arm over head but you can use Lt arm.  Heart Healthy Diet.  Follow up with Dr. Royann Shiversroitoru in 4 weeks to eval. Pacemaker.

## 2013-06-21 NOTE — Progress Notes (Addendum)
06/21/2013   PCP: Lorie Phenix, MD   Chief Complaint  Patient presents with  . post hosp    pacemaker placement    Primary Cardiologist:Dr. Herbie Baltimore  HPI:  80 year old WF here for pacer site check and staple removal.   She was recently admitted with a Lt brain CVA. She had no documented arrythmia at that time. A TEE was unremarkable. After discharge a Holter monitor showed PAF with pauses up to 7 seconds.  The pt denies any syncope or palpitations. She lives alone. Unfortunately we don't think she has a good understanding of her medical issues. She is unsteady on her feet and has had several falls. She attributes this to DJD and a "failed" knee replacement. She also has biliary cirrhosis followed by Dr Christella Hartigan.  She was seen by Dr. Royann Shivers who recommended PPM for Tachy/Brady Syndrome.  Unfortunately due to bilary cirrhosis and freq falls she is not an anticoagulation candidate.    Medtronic pacer placed 06/11/13 without complications and she was d/c'd the next day.  Pacer site is well approximated and no signs of infection.  Site cleaned and staples removed without complication.  She has been having back pain but has pain meds to take for this.  No pain at pacer site and no chest pain.    Allergies  Allergen Reactions  . Aspirin Other (See Comments)    Liver function  . Tylenol [Acetaminophen] Other (See Comments)    Liver function   . Penicillins Rash    Current Outpatient Prescriptions  Medication Sig Dispense Refill  . atorvastatin (LIPITOR) 40 MG tablet Take 1 tablet (40 mg total) by mouth daily at 6 PM.  30 tablet  5  . clopidogrel (PLAVIX) 75 MG tablet Take 1 tablet (75 mg total) by mouth daily with breakfast.  30 tablet  5  . diazepam (VALIUM) 5 MG tablet Take 5 mg by mouth every 8 (eight) hours as needed for anxiety.       Marland Kitchen esomeprazole (NEXIUM) 40 MG capsule Take 40 mg by mouth 2 (two) times daily before a meal.       . glucosamine-chondroitin 500-400 MG  tablet Take 1 tablet by mouth 3 (three) times daily.       . magnesium oxide (MAG-OX) 400 MG tablet Take 400 mg by mouth daily.        . metoprolol succinate (TOPROL-XL) 50 MG 24 hr tablet Take 25 mg by mouth daily. Take with or immediately following a meal.      . Multiple Vitamin (MULITIVITAMIN WITH MINERALS) TABS Take 1 tablet by mouth daily.      Marland Kitchen oxyCODONE (OXY IR/ROXICODONE) 5 MG immediate release tablet Take 5 mg by mouth 3 (three) times daily as needed for moderate pain. For pain      . traZODone (DESYREL) 150 MG tablet Take 75 mg by mouth at bedtime.        No current facility-administered medications for this visit.    Past Medical History  Diagnosis Date  . Scoliosis   . Liver cirrhosis   . Hypertension   . Acid reflux   . High cholesterol   . Dysrhythmia     "skips" (06/12/2013)  . COPD (chronic obstructive pulmonary disease)   . Exertional shortness of breath     "recently" (06/12/2013)  . Anemia 1957  . History of blood transfusion     "think it was when I had knee OR" (06/12/2013)  .  Stroke 05/14/2013    denies residual on 06/12/2013  . Degenerative disc disease, cervical   . Chronic back pain   . Anxiety   . Pacemaker, Medtronic placed 06/01/13 06/11/2013    Past Surgical History  Procedure Laterality Date  . Replacement total knee Right 2010  . Cataract extraction w/ intraocular lens  implant, bilateral Bilateral 2006-2007  . Tee without cardioversion N/A 05/17/2013    Procedure: TRANSESOPHAGEAL ECHOCARDIOGRAM (TEE);  Surgeon: Vesta MixerPhilip J Nahser, MD;  Location: [facility];  Service: Cardiovascular;  Laterality: N/A;  . Insert / replace / remove pacemaker  06/11/2013  . Tonsillectomy  ~ 1961  . Joint replacement    . Pacemaker insertion  06/01/13    Dr. Royann Shiversroitoru/ Medtronic device    ZOX:WRUEAVW:UJROS:General:no colds or fevers MS:no joint pain, no claudication, complains of back pain, is taking pain meds Neuro:no syncope, no lightheadedness Endo:no diabetes, no thyroid  disease  PHYSICAL EXAM BP 160/80  Pulse 83  Ht 4\' 9"  (1.448 m)  Wt 168 lb 11.2 oz (76.522 kg)  BMI 36.50 kg/m2 General:Pleasant affect, NAD  BP on recheck 140/84 Skin:Warm and dry, brisk capillary refill Heart:S1S2 RRR without murmur, gallup, rub or click Lungs:clear without rales, rhonchi, or wheezes EKG:SR no acute changes  ASSESSMENT AND PLAN Pacemaker, Medtronic placed 06/11/13 Pacer site well approximated, staples removed, site cleaned.  No infection.  Pt may have more movement of Lt arm.   Tachy-brady syndrome PPM placed for PAF and sinus pauses.  Stroke Lt brain-05/14/13- probably embolic Monitor revealed PAF, on Plavix, not anticoagulation candidate    Paroxysmal atrial fibrillation, has not been felt to be anticoagulation candidate secondary to freq falls and biliary cirhosis   Currently SR

## 2013-06-21 NOTE — Assessment & Plan Note (Signed)
Monitor revealed PAF, on Plavix, not anticoagulation candidate

## 2013-06-21 NOTE — Assessment & Plan Note (Signed)
PPM placed for PAF and sinus pauses.

## 2013-06-21 NOTE — Assessment & Plan Note (Signed)
Currently SR

## 2013-06-21 NOTE — Assessment & Plan Note (Signed)
Pacer site well approximated, staples removed, site cleaned.  No infection.  Pt may have more movement of Lt arm.

## 2013-06-24 ENCOUNTER — Telehealth: Payer: Self-pay | Admitting: *Deleted

## 2013-06-26 ENCOUNTER — Telehealth: Payer: Self-pay | Admitting: *Deleted

## 2013-06-26 NOTE — Telephone Encounter (Signed)
Signed order to resume OT and PT to [facility].

## 2013-07-01 ENCOUNTER — Telehealth: Payer: Self-pay | Admitting: *Deleted

## 2013-07-01 NOTE — Telephone Encounter (Signed)
Signed order to resume OT s/p pacemaker placement faxed.

## 2013-07-23 ENCOUNTER — Encounter: Payer: Self-pay | Admitting: Cardiovascular Disease

## 2013-07-23 ENCOUNTER — Ambulatory Visit (INDEPENDENT_AMBULATORY_CARE_PROVIDER_SITE_OTHER): Payer: Medicare Other | Admitting: Cardiovascular Disease

## 2013-07-23 VITALS — BP 148/70 | HR 84 | Ht 60.0 in | Wt 166.7 lb

## 2013-07-23 DIAGNOSIS — I4891 Unspecified atrial fibrillation: Secondary | ICD-10-CM | POA: Diagnosis not present

## 2013-07-23 DIAGNOSIS — I455 Other specified heart block: Secondary | ICD-10-CM

## 2013-07-23 DIAGNOSIS — Z950 Presence of cardiac pacemaker: Secondary | ICD-10-CM

## 2013-07-23 DIAGNOSIS — I495 Sick sinus syndrome: Secondary | ICD-10-CM

## 2013-07-23 DIAGNOSIS — I480 Paroxysmal atrial fibrillation: Secondary | ICD-10-CM

## 2013-07-23 LAB — MDC_IDC_ENUM_SESS_TYPE_INCLINIC
Battery Remaining Longevity: 13
Brady Statistic RV Percent Paced: 0.1 % — CL
Lead Channel Impedance Value: 408 Ohm
Lead Channel Impedance Value: 532 Ohm
Lead Channel Pacing Threshold Amplitude: 1 V
Lead Channel Pacing Threshold Pulse Width: 0.4 ms
Lead Channel Pacing Threshold Pulse Width: 0.4 ms
Lead Channel Setting Pacing Amplitude: 3.5 V
Lead Channel Setting Pacing Pulse Width: 0.4 ms
Lead Channel Setting Sensing Sensitivity: 2.8 mV
MDC IDC MSMT LEADCHNL RA PACING THRESHOLD AMPLITUDE: 0.5 V
MDC IDC MSMT LEADCHNL RA SENSING INTR AMPL: 4 mV
MDC IDC MSMT LEADCHNL RV SENSING INTR AMPL: 16 mV
MDC IDC SET LEADCHNL RV PACING AMPLITUDE: 3.5 V
MDC IDC STAT BRADY RA PERCENT PACED: 1.5 %
Zone Setting Detection Interval: 400 ms
Zone Setting Detection Interval: 400 ms

## 2013-07-23 LAB — PACEMAKER DEVICE OBSERVATION

## 2013-07-23 MED ORDER — METOPROLOL SUCCINATE ER 50 MG PO TB24: 50.0000 mg | ORAL_TABLET | Freq: Every day | ORAL | Status: DC

## 2013-07-23 NOTE — Patient Instructions (Signed)
Your physician recommends that you schedule a follow-up appointment in: 6 Months  Your physician has recommended you make the following change in your medication: Increase metoprolol to 50 mg (1 tablets) daily  Remote monitoring is used to monitor your Pacemaker of ICD from home. This monitoring reduces the number of office visits required to check your device to one time per year. It allows us to keep an eye on the functioning of your device to ensure it is working properly. You are scheduled for a device check from home on June 4th. You may send your transmission at any time that day. If you have a wireless device, the transmission will be sent automatically. After your physician reviews your transmission, you will receive a postcard with your next transmission date.   '

## 2013-08-05 ENCOUNTER — Encounter: Payer: Self-pay | Admitting: Cardiovascular Disease

## 2013-08-05 NOTE — Assessment & Plan Note (Signed)
Well-healed pacemaker site. Device reprogrammed to chronic settings. Enrolled in [facility] followup

## 2013-08-05 NOTE — Assessment & Plan Note (Signed)
Increase metoprolol to a total of 50 mg daily. Continue clopidogrel for stroke prevention.

## 2013-08-05 NOTE — Progress Notes (Signed)
Patient ID: Diamond Miller, female   DOB: [birth year], 80 y.o.   MRN: [id number]      Reason for office visit Followup visit for paroxysmal atrial fibrillation and pacemaker check  Diamond Miller is now roughly 6 weeks following elective implantation of a dual-chamber permanent pacemaker for tachycardia-bradycardia syndrome. She has paroxysmal atrial fibrillation but is not a good anticoagulation candidate due to a perceived high risk of bleeding. She is on clopidogrel for stroke prevention. Interrogation of her pacemaker shows an overall burden of atrial for ablation of 1.3% including one episode that lasted for over 4-1/2 hours. The average ventricular rate was 120 beats a minute during that event. She was unaware of the arrhythmia.   Allergies  Allergen Reactions  . Aspirin Other (See Comments)    Liver function  . Tylenol [Acetaminophen] Other (See Comments)    Liver function   . Penicillins Rash    Current Outpatient Prescriptions  Medication Sig Dispense Refill  . atorvastatin (LIPITOR) 40 MG tablet Take 1 tablet (40 mg total) by mouth daily at 6 PM.  30 tablet  5  . clopidogrel (PLAVIX) 75 MG tablet Take 1 tablet (75 mg total) by mouth daily with breakfast.  30 tablet  5  . diazepam (VALIUM) 5 MG tablet Take 5 mg by mouth every 8 (eight) hours as needed for anxiety.       Marland Kitchen esomeprazole (NEXIUM) 40 MG capsule Take 40 mg by mouth 2 (two) times daily before a meal.       . glucosamine-chondroitin 500-400 MG tablet Take 1 tablet by mouth 3 (three) times daily.       . magnesium oxide (MAG-OX) 400 MG tablet Take 400 mg by mouth daily.        . methocarbamol (ROBAXIN) 500 MG tablet Take 500 mg by mouth every 8 (eight) hours as needed for muscle spasms.      . metoprolol succinate (TOPROL-XL) 50 MG 24 hr tablet Take 1 tablet (50 mg total) by mouth daily. Take with or immediately following a meal.  30 tablet  6  . Multiple Vitamin (MULITIVITAMIN WITH MINERALS) TABS Take 1 tablet by  mouth daily.      Marland Kitchen oxyCODONE (OXY IR/ROXICODONE) 5 MG immediate release tablet Take 5 mg by mouth 3 (three) times daily as needed for moderate pain. For pain      . traZODone (DESYREL) 150 MG tablet Take 75 mg by mouth at bedtime.        No current facility-administered medications for this visit.    Past Medical History  Diagnosis Date  . Scoliosis   . Liver cirrhosis   . Hypertension   . Acid reflux   . High cholesterol   . Dysrhythmia     "skips" (06/12/2013)  . COPD (chronic obstructive pulmonary disease)   . Exertional shortness of breath     "recently" (06/12/2013)  . Anemia 1957  . History of blood transfusion     "think it was when I had knee OR" (06/12/2013)  . Stroke 05/14/2013    denies residual on 06/12/2013  . Degenerative disc disease, cervical   . Chronic back pain   . Anxiety   . Pacemaker, Medtronic placed 06/01/13 06/11/2013    Past Surgical History  Procedure Laterality Date  . Replacement total knee Right 2010  . Cataract extraction w/ intraocular lens  implant, bilateral Bilateral 2006-2007  . Tee without cardioversion N/A 05/17/2013    Procedure: TRANSESOPHAGEAL ECHOCARDIOGRAM (TEE);  Surgeon: Diamond Miller  Diamond Regal, MD;  Location: [facility];  Service: Cardiovascular;  Laterality: N/A;  . Insert / replace / remove pacemaker  06/11/2013  . Tonsillectomy  ~ 1961  . Joint replacement    . Pacemaker insertion  06/01/13    Dr. Royann Shivers Medtronic device    Family History  Problem Relation Age of Onset  . Heart disease Mother   . Hypertension Sister   . Dementia Brother 12    History   Social History  . Marital Status: Widowed    Spouse Name: N/A    Number of Children: N/A  . Years of Education: N/A   Occupational History  . Not on file.   Social History Main Topics  . Smoking status: Never Smoker   . Smokeless tobacco: Never Used  . Alcohol Use: No  . Drug Use: No  . Sexual Activity: No   Other Topics Concern  . Not on file   Social History  Narrative  . No narrative on file    Review of systems: The patient specifically denies any chest pain at rest or with exertion, dyspnea at rest or with exertion, orthopnea, paroxysmal nocturnal dyspnea, syncope, palpitations, focal neurological deficits, intermittent claudication, lower extremity edema, unexplained weight gain, cough, hemoptysis or wheezing.  The patient also denies abdominal pain, nausea, vomiting, dysphagia, diarrhea, constipation, polyuria, polydipsia, dysuria, hematuria, frequency, urgency, abnormal bleeding or bruising, fever, chills, unexpected weight changes, mood swings, change in skin or hair texture, change in voice quality, auditory or visual problems, allergic reactions or rashes, new musculoskeletal complaints other than usual "aches and pains".   PHYSICAL EXAM BP 148/70  Pulse 84  Ht 5' (1.524 m)  Wt 75.615 kg (166 lb 11.2 oz)  BMI 32.56 kg/m2  General: Alert, oriented x3, no distress Head: no evidence of trauma, PERRL, EOMI, no exophtalmos or lid lag, no myxedema, no xanthelasma; normal ears, nose and oropharynx Neck: normal jugular venous pulsations and no hepatojugular reflux; brisk carotid pulses without delay and no carotid bruits Chest: clear to auscultation, no signs of consolidation by percussion or palpation, normal fremitus, symmetrical and full respiratory excursions, well-healed left subclavian pacemaker site Cardiovascular: normal position and quality of the apical impulse, regular rhythm, normal first and second heart sounds, no murmurs, rubs or gallops Abdomen: no tenderness or distention, no masses by palpation, no abnormal pulsatility or arterial bruits, normal bowel sounds, no hepatosplenomegaly Extremities: no clubbing, cyanosis or edema; 2+ radial, ulnar and brachial pulses bilaterally; 2+ right femoral, posterior tibial and dorsalis pedis pulses; 2+ left femoral, posterior tibial and dorsalis pedis pulses; no subclavian or femoral  bruits Neurological: grossly nonfocal   EKG: NSR  Lipid Panel     Component Value Date/Time   CHOL 168 05/15/2013 0155   TRIG 123 05/15/2013 0155   HDL 38* 05/15/2013 0155   CHOLHDL 4.4 05/15/2013 0155   VLDL 25 05/15/2013 0155   LDLCALC 105* 05/15/2013 0155    BMET    Component Value Date/Time   NA 134* 06/04/2013 1105   K 4.3 06/04/2013 1105   CL 96 06/04/2013 1105   CO2 30 06/04/2013 1105   GLUCOSE 102* 06/04/2013 1105   BUN 10 06/04/2013 1105   CREATININE 0.77 06/04/2013 1105   CREATININE 0.80 05/14/2013 0834   CALCIUM 9.6 06/04/2013 1105   GFRNONAA 78* 05/14/2013 0825   GFRAA >90 05/14/2013 0825     ASSESSMENT AND PLAN Paroxysmal atrial fibrillation, has not been felt to be anticoagulation candidate secondary to freq falls and  biliary cirhosis   Increase metoprolol to a total of 50 mg daily. Continue clopidogrel for stroke prevention.  Pacemaker, Medtronic placed 06/11/13 Well-healed pacemaker site. Device reprogrammed to chronic settings. Enrolled in [facility] followup   Patient Instructions  Your physician recommends that you schedule a follow-up appointment in: 6 Months  Your physician has recommended you make the following change in your medication: Increase metoprolol to 50 mg (1 tablets) daily  Remote monitoring is used to monitor your Pacemaker of ICD from home. This monitoring reduces the number of office visits required to check your device to one time per year. It allows us to keep an eye on the functioning of your device to ensure it is working properly. You are scheduled for a device check from home on June 4th. You may send your transmission at any time that day. If you have a wireless device, the transmission will be sent automatically. After your physician reviews your transmission, you will receive a postcard with your next transmission date.   '      Orders Placed This Encounter  Procedures  . Implantable device check  . EKG 12-Lead   Meds  ordered this encounter  Medications  . methocarbamol (ROBAXIN) 500 MG tablet    Sig: Take 500 mg by mouth every 8 (eight) hours as needed for muscle spasms.  . metoprolol succinate (TOPROL-XL) 50 MG 24 hr tablet    Sig: Take 1 tablet (50 mg total) by mouth daily. Take with or immediately following a meal.    Dispense:  30 tablet    Refill:  6    Herman Fiero  Thurmon FairMihai Collette Pescador, MD, [facility] [phone] office [phone] pager

## 2013-09-23 NOTE — Telephone Encounter (Signed)
Encounter Closed---09/23/13 TP 

## 2013-10-24 ENCOUNTER — Ambulatory Visit (INDEPENDENT_AMBULATORY_CARE_PROVIDER_SITE_OTHER): Payer: Medicare Other | Admitting: *Deleted

## 2013-10-24 ENCOUNTER — Encounter: Payer: Self-pay | Admitting: Cardiovascular Disease

## 2013-10-24 DIAGNOSIS — I4891 Unspecified atrial fibrillation: Secondary | ICD-10-CM

## 2013-10-24 DIAGNOSIS — I480 Paroxysmal atrial fibrillation: Secondary | ICD-10-CM

## 2013-10-24 DIAGNOSIS — I495 Sick sinus syndrome: Secondary | ICD-10-CM

## 2013-10-25 NOTE — Progress Notes (Signed)
Remote pacemaker transmission.   

## 2013-10-28 ENCOUNTER — Telehealth: Payer: Self-pay | Admitting: Cardiovascular Disease

## 2013-10-28 NOTE — Telephone Encounter (Signed)
Closed encounter °

## 2013-10-29 LAB — MDC_IDC_ENUM_SESS_TYPE_REMOTE
Battery Remaining Longevity: 139 mo
Brady Statistic AP VP Percent: 0.01 %
Brady Statistic AP VS Percent: 2.95 %
Brady Statistic AS VP Percent: 0.06 %
Brady Statistic RA Percent Paced: 2.95 %
Brady Statistic RV Percent Paced: 0.07 %
Date Time Interrogation Session: 20150604171021
Lead Channel Impedance Value: 551 Ohm
Lead Channel Impedance Value: 627 Ohm
Lead Channel Impedance Value: 703 Ohm
Lead Channel Pacing Threshold Amplitude: 0.5 V
Lead Channel Pacing Threshold Amplitude: 0.75 V
Lead Channel Pacing Threshold Pulse Width: 0.4 ms
Lead Channel Sensing Intrinsic Amplitude: 16.5 mV
MDC IDC MSMT BATTERY VOLTAGE: 3.06 V
MDC IDC MSMT LEADCHNL RA IMPEDANCE VALUE: 456 Ohm
MDC IDC MSMT LEADCHNL RA SENSING INTR AMPL: 2.5 mV
MDC IDC MSMT LEADCHNL RA SENSING INTR AMPL: 2.5 mV
MDC IDC MSMT LEADCHNL RV PACING THRESHOLD PULSEWIDTH: 0.4 ms
MDC IDC MSMT LEADCHNL RV SENSING INTR AMPL: 16.5 mV
MDC IDC SET LEADCHNL RA PACING AMPLITUDE: 1.5 V
MDC IDC SET LEADCHNL RV PACING AMPLITUDE: 2 V
MDC IDC SET LEADCHNL RV PACING PULSEWIDTH: 0.4 ms
MDC IDC SET LEADCHNL RV SENSING SENSITIVITY: 2.8 mV
MDC IDC STAT BRADY AS VS PERCENT: 96.99 %
Zone Setting Detection Interval: 400 ms
Zone Setting Detection Interval: 400 ms

## 2013-10-30 ENCOUNTER — Telehealth: Payer: Self-pay | Admitting: Cardiovascular Disease

## 2013-10-30 DIAGNOSIS — R531 Weakness: Secondary | ICD-10-CM

## 2013-10-30 DIAGNOSIS — R609 Edema, unspecified: Secondary | ICD-10-CM

## 2013-10-30 DIAGNOSIS — R410 Disorientation, unspecified: Secondary | ICD-10-CM

## 2013-10-30 NOTE — Telephone Encounter (Signed)
Please refer

## 2013-10-30 NOTE — Telephone Encounter (Signed)
Order placed per Dr. Salena Saner for [facility]'s evaluation of Occupational Therapy and assistance at home.  Evaluate and advise.

## 2013-10-30 NOTE — Telephone Encounter (Signed)
Order placed per Dr. C for [facility]'s evaluation of Occupational Therapy and assistance at home.  Evaluate and advise. 

## 2013-10-30 NOTE — Telephone Encounter (Signed)
Will defer to Dr. Royann Shivers and Britta Mccreedy, CMA to advise.

## 2013-10-30 NOTE — Telephone Encounter (Signed)
Please call,she says she needs an order to get an occupational therapy and home health aide

## 2013-11-06 ENCOUNTER — Other Ambulatory Visit: Payer: Self-pay | Admitting: *Deleted

## 2013-11-06 MED ORDER — CLOPIDOGREL BISULFATE 75 MG PO TABS: 75.0000 mg | ORAL_TABLET | Freq: Every day | ORAL | Status: DC

## 2013-11-06 MED ORDER — ATORVASTATIN CALCIUM 40 MG PO TABS: 40.0000 mg | ORAL_TABLET | Freq: Every day | ORAL | Status: DC

## 2013-11-06 NOTE — Telephone Encounter (Signed)
Rx was sent to pharmacy electronically. 

## 2013-11-13 ENCOUNTER — Other Ambulatory Visit: Payer: Self-pay | Admitting: *Deleted

## 2013-11-13 DIAGNOSIS — R609 Edema, unspecified: Secondary | ICD-10-CM

## 2013-11-13 DIAGNOSIS — R410 Disorientation, unspecified: Secondary | ICD-10-CM

## 2013-11-13 DIAGNOSIS — R0600 Dyspnea, unspecified: Secondary | ICD-10-CM

## 2013-11-19 ENCOUNTER — Encounter: Payer: Self-pay | Admitting: Cardiology

## 2013-12-24 ENCOUNTER — Ambulatory Visit (INDEPENDENT_AMBULATORY_CARE_PROVIDER_SITE_OTHER): Payer: Medicare Other | Admitting: Cardiovascular Disease

## 2013-12-24 ENCOUNTER — Encounter: Payer: Self-pay | Admitting: Cardiovascular Disease

## 2013-12-24 VITALS — BP 128/74 | HR 76 | Resp 16 | Ht 60.0 in | Wt 153.5 lb

## 2013-12-24 DIAGNOSIS — R609 Edema, unspecified: Secondary | ICD-10-CM

## 2013-12-24 DIAGNOSIS — Z950 Presence of cardiac pacemaker: Secondary | ICD-10-CM

## 2013-12-24 DIAGNOSIS — I495 Sick sinus syndrome: Secondary | ICD-10-CM

## 2013-12-24 DIAGNOSIS — R600 Localized edema: Secondary | ICD-10-CM | POA: Insufficient documentation

## 2013-12-24 DIAGNOSIS — I455 Other specified heart block: Secondary | ICD-10-CM

## 2013-12-24 DIAGNOSIS — I4891 Unspecified atrial fibrillation: Secondary | ICD-10-CM

## 2013-12-24 DIAGNOSIS — I480 Paroxysmal atrial fibrillation: Secondary | ICD-10-CM

## 2013-12-24 LAB — MDC_IDC_ENUM_SESS_TYPE_INCLINIC
Battery Remaining Longevity: 136 mo
Brady Statistic AP VP Percent: 0.01 %
Brady Statistic AP VS Percent: 3.75 %
Brady Statistic AS VP Percent: 0.05 %
Brady Statistic AS VS Percent: 96.19 %
Brady Statistic RV Percent Paced: 0.06 %
Date Time Interrogation Session: 20150804101101
Lead Channel Impedance Value: 570 Ohm
Lead Channel Impedance Value: 627 Ohm
Lead Channel Impedance Value: 684 Ohm
Lead Channel Pacing Threshold Amplitude: 0.625 V
Lead Channel Sensing Intrinsic Amplitude: 19 mV
Lead Channel Sensing Intrinsic Amplitude: 2.375 mV
Lead Channel Sensing Intrinsic Amplitude: 3 mV
Lead Channel Setting Pacing Amplitude: 1.5 V
Lead Channel Setting Pacing Amplitude: 2 V
Lead Channel Setting Pacing Pulse Width: 0.4 ms
Lead Channel Setting Sensing Sensitivity: 2.8 mV
MDC IDC MSMT BATTERY VOLTAGE: 3.04 V
MDC IDC MSMT LEADCHNL RA IMPEDANCE VALUE: 475 Ohm
MDC IDC MSMT LEADCHNL RA PACING THRESHOLD AMPLITUDE: 0.5 V
MDC IDC MSMT LEADCHNL RA PACING THRESHOLD PULSEWIDTH: 0.4 ms
MDC IDC MSMT LEADCHNL RV PACING THRESHOLD PULSEWIDTH: 0.4 ms
MDC IDC MSMT LEADCHNL RV SENSING INTR AMPL: 22.375 mV
MDC IDC SET ZONE DETECTION INTERVAL: 400 ms
MDC IDC STAT BRADY RA PERCENT PACED: 3.76 %
Zone Setting Detection Interval: 400 ms

## 2013-12-24 MED ORDER — FUROSEMIDE 20 MG PO TABS: 20.0000 mg | ORAL_TABLET | Freq: Every day | ORAL | Status: DC | PRN

## 2013-12-24 NOTE — Patient Instructions (Signed)
Start Furosemide 20mg  daily as needed for swelling.  Remote monitoring is used to monitor your Pacemaker from home. This monitoring reduces the number of office visits required to check your device to one time per year. It allows us to monitor the functioning of your device to ensure it is working properly. You are scheduled for a device check from home on November 5,2015. You may send your transmission at any time that day. If you have a wireless device, the transmission will be sent automatically. After your physician reviews your transmission, you will receive a postcard with your next transmission date.  Dr. Royann Shiversroitoru recommends that you schedule a follow-up appointment in: One year.
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
## 2014-01-28 ENCOUNTER — Encounter: Payer: Medicare Other | Admitting: Cardiovascular Disease

## 2014-02-03 ENCOUNTER — Encounter: Payer: Self-pay | Admitting: Cardiovascular Disease

## 2014-03-03 ENCOUNTER — Emergency Department
Admission: EM | Admit: 2014-03-03 | Discharge: 2014-03-03 | Disposition: A | Discharge status: Home / Self Care | Payer: Medicare Other | Source: Home / Self Care | Attending: Emergency Medicine | Admitting: Emergency Medicine

## 2014-03-03 ENCOUNTER — Emergency Department (INDEPENDENT_AMBULATORY_CARE_PROVIDER_SITE_OTHER): Payer: Medicare Other

## 2014-03-03 ENCOUNTER — Encounter: Payer: Self-pay | Admitting: Emergency Medicine

## 2014-03-03 DIAGNOSIS — Z8673 Personal history of transient ischemic attack (TIA), and cerebral infarction without residual deficits: Secondary | ICD-10-CM

## 2014-03-03 DIAGNOSIS — S0083XA Contusion of other part of head, initial encounter: Secondary | ICD-10-CM

## 2014-03-03 DIAGNOSIS — Z23 Encounter for immunization: Secondary | ICD-10-CM

## 2014-03-03 DIAGNOSIS — G319 Degenerative disease of nervous system, unspecified: Secondary | ICD-10-CM

## 2014-03-03 DIAGNOSIS — G9389 Other specified disorders of brain: Secondary | ICD-10-CM

## 2014-03-03 DIAGNOSIS — I639 Cerebral infarction, unspecified: Secondary | ICD-10-CM

## 2014-03-03 MED ORDER — INFLUENZA VAC SPLIT QUAD 0.5 ML IM SUSY: 0.5000 mL | PREFILLED_SYRINGE | INTRAMUSCULAR | Status: DC

## 2014-03-03 MED ORDER — INFLUENZA A (H1N1) MONOVAL PF IM SUSP: 0.5000 mL | Freq: Once | INTRAMUSCULAR | Status: DC

## 2014-03-03 MED ORDER — INFLUENZA VAC SPLIT QUAD 0.5 ML IM SUSY
0.5000 mL | PREFILLED_SYRINGE | INTRAMUSCULAR | Status: AC
  Administered 2014-03-03: 0.5 mL via INTRAMUSCULAR

## 2014-03-03 NOTE — Discharge Instructions (Signed)
Contusion °A contusion is a deep bruise. Contusions are the result of an injury that caused bleeding under the skin. The contusion may turn blue, purple, or yellow. Minor injuries will give you a painless contusion, but more severe contusions may stay painful and swollen for a few weeks.  °CAUSES  °A contusion is usually caused by a blow, trauma, or direct force to an area of the body. °SYMPTOMS  °· Swelling and redness of the injured area. °· Bruising of the injured area. °· Tenderness and soreness of the injured area. °· Pain. °DIAGNOSIS  °The diagnosis can be made by taking a history and physical exam. An X-ray, CT scan, or MRI may be needed to determine if there were any associated injuries, such as fractures. °TREATMENT  °Specific treatment will depend on what area of the body was injured. In general, the best treatment for a contusion is resting, icing, elevating, and applying cold compresses to the injured area. Over-the-counter medicines may also be recommended for pain control. Ask your caregiver what the best treatment is for your contusion. °HOME CARE INSTRUCTIONS  °· Put ice on the injured area. °¨ Put ice in a plastic bag. °¨ Place a towel between your skin and the bag. °¨ Leave the ice on for 15-20 minutes, 3-4 times a day, or as directed by your health care provider. °· Only take over-the-counter or prescription medicines for pain, discomfort, or fever as directed by your caregiver. Your caregiver may recommend avoiding anti-inflammatory medicines (aspirin, ibuprofen, and naproxen) for 48 hours because these medicines may increase bruising. °· Rest the injured area. °· If possible, elevate the injured area to reduce swelling. °SEEK IMMEDIATE MEDICAL CARE IF:  °· You have increased bruising or swelling. °· You have pain that is getting worse. °· Your swelling or pain is not relieved with medicines. °MAKE SURE YOU:  °· Understand these instructions. °· Will watch your condition. °· Will get help right  away if you are not doing well or get worse. °Document Released: 02/16/2005 Document Revised: 05/14/2013 Document Reviewed: 03/14/2011 °ExitCare® Patient Information ©2015 [employer]. This information is not intended to replace advice given to you by your health care provider. Make sure you discuss any questions you have with your health care provider. ° °

## 2014-03-03 NOTE — ED Notes (Signed)
Pt c/o facial injury x 6 days ago, post fall. Denies LOC.

## 2014-03-03 NOTE — ED Provider Notes (Signed)
CSN: [id number]     Arrival date & time 03/03/14  1519 History   First MD Initiated Contact with Patient 03/03/14 1547     Chief Complaint  Patient presents with  . Facial Injury   (Consider location/radiation/quality/duration/timing/severity/associated sxs/prior Treatment) Patient is a 80 y.o. female presenting with facial injury. The history is provided by the patient. No language interpreter was used.  Facial Injury Location:  Face and nose Time since incident:  6 days Pain details:    Quality:  Aching   Severity:  Mild   Timing:  Constant   Progression:  Worsening Chronicity:  New Relieved by:  Nothing Ineffective treatments:  None tried Associated symptoms: no altered mental status and no neck pain     Past Medical History  Diagnosis Date  . Scoliosis   . Liver cirrhosis   . Hypertension   . Acid reflux   . High cholesterol   . Dysrhythmia     "skips" (06/12/2013)  . COPD (chronic obstructive pulmonary disease)   . Exertional shortness of breath     "recently" (06/12/2013)  . Anemia 1957  . History of blood transfusion     "think it was when I had knee OR" (06/12/2013)  . Stroke 05/14/2013    denies residual on 06/12/2013  . Degenerative disc disease, cervical   . Chronic back pain   . Anxiety   . Pacemaker, Medtronic placed 06/01/13 06/11/2013   Past Surgical History  Procedure Laterality Date  . Replacement total knee Right 2010  . Cataract extraction w/ intraocular lens  implant, bilateral Bilateral 2006-2007  . Tee without cardioversion N/A 05/17/2013    Procedure: TRANSESOPHAGEAL ECHOCARDIOGRAM (TEE);  Surgeon: Vesta MixerPhilip J Nahser, MD;  [facility];  Service: Cardiovascular;  Laterality: N/A;  . Insert / replace / remove pacemaker  06/11/2013  . Tonsillectomy  ~ 1961  . Joint replacement    . Pacemaker insertion  06/01/13    Dr. Royann Shiversroitoru/ Medtronic device   Family History  Problem Relation Age of Onset  . Heart disease Mother   . Hypertension  Sister   . Dementia Brother [id number]   History  Substance Use Topics  . Smoking status: Never Smoker   . Smokeless tobacco: Never Used  . Alcohol Use: No   OB History   Grav Para Term Preterm Abortions TAB SAB Ect Mult Living                 Review of Systems  Musculoskeletal: Negative for neck pain.  All other systems reviewed and are negative.   Allergies  Aspirin; Tylenol; and Penicillins  Home Medications   Prior to Admission medications   Medication Sig Start Date End Date Taking? Authorizing Provider  atorvastatin (LIPITOR) 40 MG tablet Take 1 tablet (40 mg total) by mouth daily at 6 PM. 11/06/13   Mihai Croitoru, MD  clopidogrel (PLAVIX) 75 MG tablet Take 1 tablet (75 mg total) by mouth daily with breakfast. 11/06/13   Mihai Croitoru, MD  diazepam (VALIUM) 5 MG tablet Take 5 mg by mouth every 8 (eight) hours as needed for anxiety.     Historical Provider, MD  esomeprazole (NEXIUM) 40 MG capsule Take 40 mg by mouth 2 (two) times daily before a meal.     Historical Provider, MD  furosemide (LASIX) 20 MG tablet Take 1 tablet (20 mg total) by mouth daily as needed. 12/24/13   Mihai Croitoru, MD  glucosamine-chondroitin 500-400 MG tablet Take 1 tablet by mouth 3 (three)  times daily.     Historical Provider, MD  magnesium oxide (MAG-OX) 400 MG tablet Take 400 mg by mouth daily.      Historical Provider, MD  methocarbamol (ROBAXIN) 500 MG tablet Take 500 mg by mouth every 8 (eight) hours as needed for muscle spasms.    Historical Provider, MD  metoprolol succinate (TOPROL-XL) 50 MG 24 hr tablet Take 1 tablet (50 mg total) by mouth daily. Take with or immediately following a meal. 07/23/13   Mihai Croitoru, MD  Multiple Vitamin (MULITIVITAMIN WITH MINERALS) TABS Take 1 tablet by mouth daily.    Historical Provider, MD  oxyCODONE (OXY IR/ROXICODONE) 5 MG immediate release tablet Take 5 mg by mouth 3 (three) times daily as needed for moderate pain. For pain    Historical Provider, MD  traZODone  (DESYREL) 150 MG tablet Take 75 mg by mouth at bedtime.     Historical Provider, MD   BP 175/81  Pulse 74  Temp(Src) 98.1 F (36.7 C) (Oral)  Resp 18  Wt 153 lb (69.4 kg)  SpO2 97% Physical Exam  Nursing note and vitals reviewed. Constitutional: She is oriented to person, place, and time. She appears well-developed and well-nourished.  HENT:  Head: Normocephalic.  Right Ear: External ear normal.  Nose: Nose normal.  Mouth/Throat: Oropharynx is clear and moist.  Bruising nose, bruising under eyes.  Eyes: Conjunctivae and EOM are normal. Pupils are equal, round, and reactive to light.  Neck: Normal range of motion.  Cardiovascular: Normal rate.   Pulmonary/Chest: Effort normal.  Abdominal: Soft. She exhibits no distension.  Musculoskeletal: Normal range of motion.  Neurological: She is alert and oriented to person, place, and time.  Skin: Skin is warm.  Psychiatric: She has a normal mood and affect.    ED Course  Procedures (including critical care time) Labs Review Labs Reviewed - No data to display  Imaging Review Ct Head Wo Contrast  03/03/2014   CLINICAL DATA:  Larey SeatFell 1 week ago.  Facial pain.  History of stroke.  EXAM: CT HEAD WITHOUT CONTRAST  CT MAXILLOFACIAL WITHOUT CONTRAST  TECHNIQUE: Multidetector CT imaging of the head and maxillofacial structures were performed using the standard protocol without intravenous contrast. Multiplanar CT image reconstructions of the maxillofacial structures were also generated.  COMPARISON:  CT head 05/14/2013.  MR head 05/14/2013.  FINDINGS: CT HEAD FINDINGS  No evidence for acute infarction, hemorrhage, mass lesion, hydrocephalus, or extra-axial fluid. Generalized atrophy. Chronic microvascular ischemic change. Large remote RIGHT frontal infarct. Scattered remote lacunar infarcts in the deep white matter. Vascular calcification.  Calvarium intact. Slight LEFT frontal scalp hematoma, without visible laceration or foreign body. Sinus  air-fluid level LEFT sphenoid was present in 2014, likely nonacute with regard to head trauma. BILATERAL cataract extraction.  CT MAXILLOFACIAL FINDINGS  No visible facial fracture. Sinus air-fluid level LEFT sphenoid non worrisome. No other sinus fluid collection. Negative orbits. TMJs located. Upper cervical region unremarkable. No blowout injury. No nasal cavity masses. Prior mandibular fracture repair. Scattered calcifications in the floor of the mouth could be postsurgical or posttraumatic.  IMPRESSION: Chronic intracranial findings as described. No skull fracture or intracranial hemorrhage.  No acute facial fracture or acute sinus air-fluid level.   Electronically Signed   By: Davonna BellingJohn  Curnes M.D.   On: 03/03/2014 16:29   Ct Maxillofacial Wo Cm  03/03/2014   CLINICAL DATA:  Larey SeatFell 1 week ago.  Facial pain.  History of stroke.  EXAM: CT HEAD WITHOUT CONTRAST  CT MAXILLOFACIAL WITHOUT  CONTRAST  TECHNIQUE: Multidetector CT imaging of the head and maxillofacial structures were performed using the standard protocol without intravenous contrast. Multiplanar CT image reconstructions of the maxillofacial structures were also generated.  COMPARISON:  CT head 05/14/2013.  MR head 05/14/2013.  FINDINGS: CT HEAD FINDINGS  No evidence for acute infarction, hemorrhage, mass lesion, hydrocephalus, or extra-axial fluid. Generalized atrophy. Chronic microvascular ischemic change. Large remote RIGHT frontal infarct. Scattered remote lacunar infarcts in the deep white matter. Vascular calcification.  Calvarium intact. Slight LEFT frontal scalp hematoma, without visible laceration or foreign body. Sinus air-fluid level LEFT sphenoid was present in 2014, likely nonacute with regard to head trauma. BILATERAL cataract extraction.  CT MAXILLOFACIAL FINDINGS  No visible facial fracture. Sinus air-fluid level LEFT sphenoid non worrisome. No other sinus fluid collection. Negative orbits. TMJs located. Upper cervical region unremarkable.  No blowout injury. No nasal cavity masses. Prior mandibular fracture repair. Scattered calcifications in the floor of the mouth could be postsurgical or posttraumatic.  IMPRESSION: Chronic intracranial findings as described. No skull fracture or intracranial hemorrhage.  No acute facial fracture or acute sinus air-fluid level.   Electronically Signed   By: Davonna Belling M.D.   On: 03/03/2014 16:29     MDM   1. Contusion of face, initial encounter    Pt advised to continue medications.    See her MD for recheck in 1 week Fall prevention AVS    Elson Areas, PA-C 03/03/14 1709

## 2014-03-06 NOTE — ED Provider Notes (Signed)
Medical history/examination/treatment/procedure(s) were performed by non-physician provider and as supervising physician I was immediately available for consultation/collaboration.  Lajean Manesavid Massey, MD 03/06/14 [phone]

## 2014-03-27 ENCOUNTER — Encounter: Payer: Self-pay | Admitting: Cardiovascular Disease

## 2014-03-27 ENCOUNTER — Telehealth: Payer: Self-pay | Admitting: Cardiology

## 2014-03-27 ENCOUNTER — Ambulatory Visit (INDEPENDENT_AMBULATORY_CARE_PROVIDER_SITE_OTHER): Payer: Medicare Other | Admitting: *Deleted

## 2014-03-27 DIAGNOSIS — I495 Sick sinus syndrome: Secondary | ICD-10-CM

## 2014-03-27 NOTE — Telephone Encounter (Signed)
Spoke with pt and reminded pt of remote transmission that is due today. Pt verbalized understanding.   

## 2014-03-27 NOTE — Progress Notes (Signed)
Remote pacemaker transmission.   

## 2014-03-29 LAB — MDC_IDC_ENUM_SESS_TYPE_REMOTE
Battery Voltage: 3.04 V
Brady Statistic AP VP Percent: 0.01 %
Brady Statistic AP VS Percent: 10.04 %
Brady Statistic AS VP Percent: 0.09 %
Brady Statistic AS VS Percent: 89.85 %
Lead Channel Impedance Value: 532 Ohm
Lead Channel Impedance Value: 646 Ohm
Lead Channel Impedance Value: 722 Ohm
Lead Channel Pacing Threshold Amplitude: 0.625 V
Lead Channel Pacing Threshold Pulse Width: 0.4 ms
Lead Channel Sensing Intrinsic Amplitude: 20.125 mV
Lead Channel Sensing Intrinsic Amplitude: 20.125 mV
Lead Channel Sensing Intrinsic Amplitude: 3.125 mV
Lead Channel Setting Pacing Amplitude: 1.75 V
Lead Channel Setting Sensing Sensitivity: 2.8 mV
MDC IDC MSMT BATTERY REMAINING LONGEVITY: 128 mo
MDC IDC MSMT LEADCHNL RA IMPEDANCE VALUE: 418 Ohm
MDC IDC MSMT LEADCHNL RA PACING THRESHOLD AMPLITUDE: 0.875 V
MDC IDC MSMT LEADCHNL RA PACING THRESHOLD PULSEWIDTH: 0.4 ms
MDC IDC MSMT LEADCHNL RA SENSING INTR AMPL: 3.125 mV
MDC IDC SESS DTM: 20151105195918
MDC IDC SET LEADCHNL RV PACING AMPLITUDE: 2 V
MDC IDC SET LEADCHNL RV PACING PULSEWIDTH: 0.4 ms
MDC IDC STAT BRADY RA PERCENT PACED: 10.05 %
MDC IDC STAT BRADY RV PERCENT PACED: 0.1 %
Zone Setting Detection Interval: 400 ms
Zone Setting Detection Interval: 400 ms

## 2014-04-11 ENCOUNTER — Encounter: Payer: Self-pay | Admitting: Cardiology

## 2014-04-15 ENCOUNTER — Ambulatory Visit (INDEPENDENT_AMBULATORY_CARE_PROVIDER_SITE_OTHER): Payer: Medicare Other | Admitting: Gastroenterology

## 2014-04-15 ENCOUNTER — Other Ambulatory Visit (INDEPENDENT_AMBULATORY_CARE_PROVIDER_SITE_OTHER): Payer: Medicare Other

## 2014-04-15 ENCOUNTER — Encounter: Payer: Self-pay | Admitting: Gastroenterology

## 2014-04-15 VITALS — BP 110/68 | HR 71 | Ht 59.0 in | Wt 146.0 lb

## 2014-04-15 DIAGNOSIS — K7460 Unspecified cirrhosis of liver: Secondary | ICD-10-CM

## 2014-04-15 DIAGNOSIS — K743 Primary biliary cirrhosis: Secondary | ICD-10-CM

## 2014-04-15 LAB — CBC WITH DIFFERENTIAL/PLATELET
BASOS PCT: 0.4 % (ref 0.0–3.0)
Basophils Absolute: 0 10*3/uL (ref 0.0–0.1)
EOS ABS: 0.2 10*3/uL (ref 0.0–0.7)
Eosinophils Relative: 2.3 % (ref 0.0–5.0)
HCT: 39.3 % (ref 36.0–46.0)
Hemoglobin: 13 g/dL (ref 12.0–15.0)
LYMPHS PCT: 29.9 % (ref 12.0–46.0)
Lymphs Abs: 2.9 10*3/uL (ref 0.7–4.0)
MCHC: 33.1 g/dL (ref 30.0–36.0)
MCV: 89.9 fl (ref 78.0–100.0)
Monocytes Absolute: 0.8 10*3/uL (ref 0.1–1.0)
Monocytes Relative: 8.2 % (ref 3.0–12.0)
Neutro Abs: 5.8 10*3/uL (ref 1.4–7.7)
Neutrophils Relative %: 59.2 % (ref 43.0–77.0)
Platelets: 280 10*3/uL (ref 150.0–400.0)
RBC: 4.38 Mil/uL (ref 3.87–5.11)
RDW: 13 % (ref 11.5–15.5)
WBC: 9.8 10*3/uL (ref 4.0–10.5)

## 2014-04-15 LAB — COMPREHENSIVE METABOLIC PANEL
ALT: 47 U/L — ABNORMAL HIGH (ref 0–35)
AST: 65 U/L — AB (ref 0–37)
Albumin: 3.6 g/dL (ref 3.5–5.2)
Alkaline Phosphatase: 475 U/L — ABNORMAL HIGH (ref 39–117)
BUN: 16 mg/dL (ref 6–23)
CALCIUM: 9.2 mg/dL (ref 8.4–10.5)
CHLORIDE: 97 meq/L (ref 96–112)
CO2: 30 mEq/L (ref 19–32)
CREATININE: 0.9 mg/dL (ref 0.4–1.2)
GFR: 61.78 mL/min (ref >60.00)
Glucose, Bld: 92 mg/dL (ref 70–99)
Potassium: 4.3 mEq/L (ref 3.5–5.1)
Sodium: 134 mEq/L — ABNORMAL LOW (ref 135–145)
Total Bilirubin: 0.8 mg/dL (ref 0.2–1.2)
Total Protein: 7.3 g/dL (ref 6.0–8.3)

## 2014-04-15 LAB — PROTIME-INR
INR: 1.1 ratio — ABNORMAL HIGH (ref 0.8–1.0)
Prothrombin Time: 12.3 s (ref 9.6–13.1)

## 2014-04-15 NOTE — Progress Notes (Signed)
Review of gastrointestinal problems: 1. Primary biliary cirrhosis. Labs September 2011 show otherwise liver tests completely normal, CBC normal, INR normal. Abdominal ultrasound, liver was normal. No sign of underlying cirrhosis. Alkaline phosphatase varies from 200-400 Dating back to 2010. October, 2011: anti-smooth muscle antibody negative, ANA homogenous + 1:320 titer. December, 2011 liver biopsy highly suggestive of PBC, early-stage. Trial of Urso forte resulted in dramatic diarrhea. February, 2012: diarrhea completely resolved after stopping the urso.   HPI: This is a  very pleasant 81 year old woman whom I last saw almost 4 years ago.  I was following her for primary biliary cirrhosis.  She was sent here for elevated liver tests, see below.  She has had some mild swelling in her legs for which diuretics have been increased recently by her primary care physician. No signs of encephalopathy that I can tell. No overt GI bleeding.  Labs 03/2014: alk phos 572, ast/alt 50-70.  Bili normal. CBC normal.  Xmas 2014 she had a CVA.   A pacemaker plast 05/2013.  Walking with a walker now.  Since the CVA last year.  Previously was walking with a cane.     Review of systems: Pertinent positive and negative review of systems were noted in the above HPI section. Complete review of systems was performed and was otherwise normal.    Past Medical History  Diagnosis Date  . Scoliosis   . Liver cirrhosis   . Hypertension   . Acid reflux   . High cholesterol   . Dysrhythmia     "skips" (06/12/2013)  . COPD (chronic obstructive pulmonary disease)   . Exertional shortness of breath     "recently" (06/12/2013)  . Anemia 1957  . History of blood transfusion     "think it was when I had knee OR" (06/12/2013)  . Stroke 05/14/2013    denies residual on 06/12/2013  . Degenerative disc disease, cervical   . Chronic back pain   . Anxiety   . Pacemaker, Medtronic placed 06/01/13 06/11/2013     Past Surgical History  Procedure Laterality Date  . Replacement total knee Right 2010  . Cataract extraction w/ intraocular lens  implant, bilateral Bilateral 2006-2007  . Tee without cardioversion N/A 05/17/2013    Procedure: TRANSESOPHAGEAL ECHOCARDIOGRAM (TEE);  Surgeon: Thayer Headings, MD;  Location: [facility];  Service: Cardiovascular;  Laterality: N/A;  . Insert / replace / remove pacemaker  06/11/2013  . Tonsillectomy  ~ 1961  . Joint replacement    . Pacemaker insertion  06/01/13    Dr. Sallyanne Kuster Medtronic device    Current Outpatient Prescriptions  Medication Sig Dispense Refill  . atorvastatin (LIPITOR) 40 MG tablet Take 1 tablet (40 mg total) by mouth daily at 6 PM. 30 tablet 8  . clopidogrel (PLAVIX) 75 MG tablet Take 1 tablet (75 mg total) by mouth daily with breakfast. 30 tablet 8  . diazepam (VALIUM) 5 MG tablet Take 5 mg by mouth every 8 (eight) hours as needed for anxiety.     Marland Kitchen esomeprazole (NEXIUM) 40 MG capsule Take 40 mg by mouth 2 (two) times daily before a meal.     . furosemide (LASIX) 20 MG tablet Take 1 tablet (20 mg total) by mouth daily as needed. 30 tablet 11  . glucosamine-chondroitin 500-400 MG tablet Take 1 tablet by mouth 3 (three) times daily.     . magnesium oxide (MAG-OX) 400 MG tablet Take 400 mg by mouth daily.      . metoprolol succinate (  TOPROL-XL) 50 MG 24 hr tablet Take 1 tablet (50 mg total) by mouth daily. Take with or immediately following a meal. 30 tablet 6  . Multiple Vitamin (MULITIVITAMIN WITH MINERALS) TABS Take 1 tablet by mouth daily.    Marland Kitchen oxyCODONE (OXY IR/ROXICODONE) 5 MG immediate release tablet Take 5 mg by mouth 3 (three) times daily as needed for moderate pain. For pain    . traZODone (DESYREL) 150 MG tablet Take 75 mg by mouth at bedtime.     Marland Kitchen spironolactone (ALDACTONE) 25 MG tablet   3   No current facility-administered medications for this visit.    Allergies as of 04/15/2014 - Review Complete 04/15/2014  Allergen  Reaction Noted  . Aspirin Other (See Comments) 10/18/2011  . Tylenol [acetaminophen] Other (See Comments) 11/26/2012  . Penicillins Rash 10/18/2011    Family History  Problem Relation Age of Onset  . Heart disease Mother   . Hypertension Sister   . Dementia Brother 30    History   Social History  . Marital Status: Widowed    Spouse Name: N/A    Number of Children: N/A  . Years of Education: N/A   Occupational History  . Not on file.   Social History Main Topics  . Smoking status: Never Smoker   . Smokeless tobacco: Never Used  . Alcohol Use: No  . Drug Use: No  . Sexual Activity: No   Other Topics Concern  . Not on file   Social History Narrative       Physical Exam: BP 110/68 mmHg  Pulse 71  Ht _0  (1.499 m)  Wt 146 lb (66.225 kg)  BMI 29.47 kg/m2  SpO2 98% Constitutional: generally well-appearing Psychiatric: alert and oriented x3 Eyes: extraocular movements intact Mouth: oral pharynx moist, no lesions Neck: supple no lymphadenopathy Cardiovascular: heart regular rate and rhythm Lungs: clear to auscultation bilaterally Abdomen: soft, nontender, nondistended, no obvious ascites, no peritoneal signs, normal bowel sounds Extremities: 1+ lower extremity swelling Skin: no lesions on visible extremities    Assessment and plan: 80 y.o. female with  PBC  She has not had imaging signs of scar, cirrhosis development as of 2-3 years ago. She has had some minor swelling in her lower extremities. No obvious ascites on exam. Quite frail after a stroke, pacemaker insertion, she is walking with a walker.  The liver tests that I received from her primary care physician are in line with her previous diagnosis of PBC. She has not had liver imaging in 3-4 years that I can tell I'm going to repeat a liver ultrasound for her now. She will also get a basic set of repeat labs including CBC, coags, complete metabolic profile.

## 2014-04-15 NOTE — Patient Instructions (Addendum)
You will be set up for an ultrasound of abdomen, ? Cirrhosis changes.  You have been scheduled for an abdominal ultrasound [facility] (1st floor of hospital) on 04/18/14 at 9 am. Please arrive 15 minutes prior to your appointment for registration. Make certain not to have anything to eat or drink 6 hours prior to your appointment. Should you need to reschedule your appointment, please contact radiology at [phone]. This test typically takes about 30 minutes to perform. Labs today (cmet, inr, cbc).

## 2014-04-16 ENCOUNTER — Encounter: Payer: Self-pay | Admitting: Gastroenterology

## 2014-04-18 ENCOUNTER — Ambulatory Visit (HOSPITAL_COMMUNITY): Payer: Medicare Other

## 2014-04-28 ENCOUNTER — Ambulatory Visit (HOSPITAL_COMMUNITY): Payer: Medicare Other

## 2014-05-01 ENCOUNTER — Encounter (HOSPITAL_COMMUNITY): Payer: Self-pay | Admitting: Cardiovascular Disease

## 2014-06-11 ENCOUNTER — Other Ambulatory Visit: Payer: Self-pay | Admitting: Cardiovascular Disease

## 2014-06-11 NOTE — Telephone Encounter (Signed)
Rx(s) sent to pharmacy electronically.  

## 2014-06-26 DIAGNOSIS — M546 Pain in thoracic spine: Secondary | ICD-10-CM | POA: Diagnosis not present

## 2014-06-26 DIAGNOSIS — M25562 Pain in left knee: Secondary | ICD-10-CM | POA: Diagnosis not present

## 2014-06-26 DIAGNOSIS — M5136 Other intervertebral disc degeneration, lumbar region: Secondary | ICD-10-CM | POA: Diagnosis not present

## 2014-06-30 ENCOUNTER — Encounter: Payer: Self-pay | Admitting: Cardiovascular Disease

## 2014-06-30 ENCOUNTER — Ambulatory Visit (INDEPENDENT_AMBULATORY_CARE_PROVIDER_SITE_OTHER): Payer: Medicare Other | Admitting: *Deleted

## 2014-06-30 DIAGNOSIS — I495 Sick sinus syndrome: Secondary | ICD-10-CM | POA: Diagnosis not present

## 2014-06-30 LAB — MDC_IDC_ENUM_SESS_TYPE_REMOTE
Battery Remaining Longevity: 122 mo
Battery Voltage: 3.03 V
Brady Statistic RA Percent Paced: 14.24 %
Brady Statistic RV Percent Paced: 0.05 %
Date Time Interrogation Session: 20160208172400
Lead Channel Impedance Value: 494 Ohm
Lead Channel Pacing Threshold Amplitude: 0.625 V
Lead Channel Pacing Threshold Amplitude: 0.625 V
Lead Channel Pacing Threshold Pulse Width: 0.4 ms
Lead Channel Sensing Intrinsic Amplitude: 3.25 mV
Lead Channel Sensing Intrinsic Amplitude: 3.25 mV
Lead Channel Setting Pacing Amplitude: 2 V
Lead Channel Setting Pacing Pulse Width: 0.4 ms
Lead Channel Setting Sensing Sensitivity: 2.8 mV
MDC IDC MSMT LEADCHNL RA IMPEDANCE VALUE: 608 Ohm
MDC IDC MSMT LEADCHNL RV IMPEDANCE VALUE: 608 Ohm
MDC IDC MSMT LEADCHNL RV IMPEDANCE VALUE: 665 Ohm
MDC IDC MSMT LEADCHNL RV PACING THRESHOLD PULSEWIDTH: 0.4 ms
MDC IDC MSMT LEADCHNL RV SENSING INTR AMPL: 18 mV
MDC IDC MSMT LEADCHNL RV SENSING INTR AMPL: 18 mV
MDC IDC SET LEADCHNL RA PACING AMPLITUDE: 1.5 V
MDC IDC SET ZONE DETECTION INTERVAL: 400 ms
MDC IDC STAT BRADY AP VP PERCENT: 0.01 %
MDC IDC STAT BRADY AP VS PERCENT: 14.23 %
MDC IDC STAT BRADY AS VP PERCENT: 0.03 %
MDC IDC STAT BRADY AS VS PERCENT: 85.73 %
Zone Setting Detection Interval: 400 ms

## 2014-06-30 NOTE — Progress Notes (Signed)
Remote pacemaker transmission.   

## 2014-07-07 ENCOUNTER — Encounter: Payer: Self-pay | Admitting: Cardiology

## 2014-09-10 ENCOUNTER — Other Ambulatory Visit: Payer: Self-pay | Admitting: Cardiovascular Disease

## 2014-09-26 DIAGNOSIS — M5136 Other intervertebral disc degeneration, lumbar region: Secondary | ICD-10-CM | POA: Diagnosis not present

## 2014-09-26 DIAGNOSIS — G894 Chronic pain syndrome: Secondary | ICD-10-CM | POA: Diagnosis not present

## 2014-09-26 DIAGNOSIS — M546 Pain in thoracic spine: Secondary | ICD-10-CM | POA: Diagnosis not present

## 2014-09-29 ENCOUNTER — Ambulatory Visit (INDEPENDENT_AMBULATORY_CARE_PROVIDER_SITE_OTHER): Payer: Medicare Other | Admitting: *Deleted

## 2014-09-29 ENCOUNTER — Telehealth: Payer: Self-pay | Admitting: Cardiology

## 2014-09-29 DIAGNOSIS — I495 Sick sinus syndrome: Secondary | ICD-10-CM | POA: Diagnosis not present

## 2014-09-29 NOTE — Progress Notes (Signed)
Remote pacemaker transmission.   

## 2014-09-29 NOTE — Telephone Encounter (Signed)
Attempted to confirm remote transmission with pt. No answer and was unable to leave a message.   

## 2014-10-01 LAB — CUP PACEART REMOTE DEVICE CHECK
Brady Statistic AP VP Percent: 0.01 %
Brady Statistic AP VS Percent: 28.35 %
Brady Statistic AS VS Percent: 71.61 %
Date Time Interrogation Session: 20160509185351
Lead Channel Impedance Value: 608 Ohm
Lead Channel Impedance Value: 665 Ohm
Lead Channel Pacing Threshold Amplitude: 0.625 V
Lead Channel Pacing Threshold Pulse Width: 0.4 ms
Lead Channel Sensing Intrinsic Amplitude: 16.75 mV
Lead Channel Sensing Intrinsic Amplitude: 2.25 mV
Lead Channel Setting Pacing Amplitude: 1.5 V
Lead Channel Setting Pacing Pulse Width: 0.4 ms
Lead Channel Setting Sensing Sensitivity: 2.8 mV
MDC IDC MSMT BATTERY REMAINING LONGEVITY: 116 mo
MDC IDC MSMT BATTERY VOLTAGE: 3.02 V
MDC IDC MSMT LEADCHNL RA IMPEDANCE VALUE: 513 Ohm
MDC IDC MSMT LEADCHNL RA IMPEDANCE VALUE: 570 Ohm
MDC IDC MSMT LEADCHNL RA PACING THRESHOLD AMPLITUDE: 0.375 V
MDC IDC MSMT LEADCHNL RA PACING THRESHOLD PULSEWIDTH: 0.4 ms
MDC IDC SET LEADCHNL RV PACING AMPLITUDE: 2 V
MDC IDC STAT BRADY AS VP PERCENT: 0.03 %
MDC IDC STAT BRADY RA PERCENT PACED: 28.36 %
MDC IDC STAT BRADY RV PERCENT PACED: 0.04 %
Zone Setting Detection Interval: 400 ms
Zone Setting Detection Interval: 400 ms

## 2014-10-07 ENCOUNTER — Encounter: Payer: Self-pay | Admitting: Cardiology

## 2014-10-10 ENCOUNTER — Encounter: Payer: Self-pay | Admitting: Cardiovascular Disease

## 2014-12-30 ENCOUNTER — Encounter: Payer: Self-pay | Admitting: Cardiovascular Disease

## 2014-12-30 ENCOUNTER — Ambulatory Visit (INDEPENDENT_AMBULATORY_CARE_PROVIDER_SITE_OTHER): Payer: Medicare Other | Admitting: Cardiovascular Disease

## 2014-12-30 VITALS — BP 128/73 | HR 69 | Resp 16 | Ht 59.0 in | Wt 142.0 lb

## 2014-12-30 DIAGNOSIS — Z79899 Other long term (current) drug therapy: Secondary | ICD-10-CM | POA: Diagnosis not present

## 2014-12-30 DIAGNOSIS — I10 Essential (primary) hypertension: Secondary | ICD-10-CM | POA: Diagnosis not present

## 2014-12-30 DIAGNOSIS — I455 Other specified heart block: Secondary | ICD-10-CM | POA: Diagnosis not present

## 2014-12-30 DIAGNOSIS — I480 Paroxysmal atrial fibrillation: Secondary | ICD-10-CM

## 2014-12-30 LAB — COMPREHENSIVE METABOLIC PANEL
ALBUMIN: 3.4 g/dL — AB (ref 3.6–5.1)
ALT: 64 U/L — ABNORMAL HIGH (ref 6–29)
AST: 93 U/L — AB (ref 10–35)
Alkaline Phosphatase: 570 U/L — ABNORMAL HIGH (ref 33–130)
BUN: 18 mg/dL (ref 7–25)
CO2: 32 mmol/L — AB (ref 20–31)
CREATININE: 0.93 mg/dL (ref 0.60–0.93)
Calcium: 9.2 mg/dL (ref 8.6–10.4)
Chloride: 98 mmol/L (ref 98–110)
Glucose, Bld: 85 mg/dL (ref 65–99)
Potassium: 4.4 mmol/L (ref 3.5–5.3)
SODIUM: 137 mmol/L (ref 135–146)
TOTAL PROTEIN: 7 g/dL (ref 6.1–8.1)
Total Bilirubin: 0.6 mg/dL (ref 0.2–1.2)

## 2014-12-30 LAB — CBC
HCT: 38.2 % (ref 36.0–46.0)
HEMOGLOBIN: 12.8 g/dL (ref 12.0–15.0)
MCH: 30 pg (ref 26.0–34.0)
MCHC: 33.5 g/dL (ref 30.0–36.0)
MCV: 89.7 fL (ref 78.0–100.0)
MPV: 9.7 fL (ref 8.6–12.4)
PLATELETS: 267 10*3/uL (ref 150–400)
RBC: 4.26 MIL/uL (ref 3.87–5.11)
RDW: 13 % (ref 11.5–15.5)
WBC: 9.8 10*3/uL (ref 4.0–10.5)

## 2014-12-30 MED ORDER — FUROSEMIDE 40 MG PO TABS: 40.0000 mg | ORAL_TABLET | Freq: Every day | ORAL | Status: DC | PRN

## 2014-12-30 MED ORDER — SPIRONOLACTONE 25 MG PO TABS: 25.0000 mg | ORAL_TABLET | Freq: Every day | ORAL | Status: DC

## 2014-12-30 NOTE — Patient Instructions (Signed)
Medication Instructions:   Increase the Furosemide to  daily.  A new Rx has been sent to your pharmacy for the new strength.  Take the Spironolactone in the mornings.   Labwork:  At you convenience at [facility].    Follow-Up:  Remote monitoring is used to monitor your Pacemaker or ICD from home. This monitoring reduces the number of office visits required to check your device to one time per year. It allows [place] to monitor the functioning of your device to ensure it is working properly. You are scheduled for a device check from home on April 02, 2015 You may send your transmission at any time that day. If you have a wireless device, the transmission will be sent automatically. After your physician reviews your transmission, you will receive a postcard with your next transmission date.  Dr. Royann Shivers recommends that you schedule a follow-up appointment in: 6 months.

## 2014-12-30 NOTE — Progress Notes (Signed)
Patient ID: Diamond Miller, female   DOB: 08/20/1932, 81 y.o.   MRN: [id number]     Cardiology Office Note   Date:  01/01/2015   ID:  Diamond Miller, DOB April 11, 1933, MRN [id number]  PCP:  Lorie Phenix, MD  Cardiologist:   Thurmon Fair, MD   Chief Complaint  Patient presents with  . Annual Exam    Patient has had swelling in her feet.      History of Present Illness: Diamond Miller is a 81 y.o. female who presents for  A pacemaker check and paroxysmal atrial fibrillation.   She has numerous complaints, none of which appear to be directly heart related. She complains of fatigue, sleeplessness , vague abdominal discomfort. She has mild ankle swelling.   Interrogation of her pacemaker (dual-chamber Medtronic advisa, implanted 2015) shows normal device function. She has only 32% atrial pacing and virtually no ventricular pacing. Generator longevity is estimated to be about 9 years. She has a history of paroxysmal atrial fibrillation with a low burden of arrhythmia but recently there has been a flurry of atrial fibrillation with an overall prevalence of 1.4%. Ventricular rate control is adequate. She had a stroke, presumably embolic , in December 2014 , TIA or other embolic events. Her embolic risk is high (CHADSVasc at least 6),  Anticoagulates have not been prescribed due to concern regarding bleeding risk. She has cirrhosis of the liver (primary biliary cirrhosis). She has not had falls or in the last year. She denies any recent bleeding complications or history of melena or hematemesis.  she is only partly compliant with her diuretics since she does not like urinating at night. Her bottle of spironolactone said that she needs to take this medication at night, so she just stopped taking it.   Past Medical History  Diagnosis Date  . Scoliosis   . Liver cirrhosis   . Hypertension   . Acid reflux   . High cholesterol   . Dysrhythmia     "skips" (06/12/2013)  . COPD (chronic  obstructive pulmonary disease)   . Exertional shortness of breath     "recently" (06/12/2013)  . Anemia 1957  . History of blood transfusion     "think it was when I had knee OR" (06/12/2013)  . Stroke 05/14/2013    denies residual on 06/12/2013  . Degenerative disc disease, cervical   . Chronic back pain   . Anxiety   . Pacemaker, Medtronic placed 06/01/13 06/11/2013    Past Surgical History  Procedure Laterality Date  . Replacement total knee Right 2010  . Cataract extraction w/ intraocular lens  implant, bilateral Bilateral 2006-2007  . Tee without cardioversion N/A 05/17/2013    Procedure: TRANSESOPHAGEAL ECHOCARDIOGRAM (TEE);  Surgeon: Vesta Mixer, MD;  Location: [facility];  Service: Cardiovascular;  Laterality: N/A;  . Insert / replace / remove pacemaker  06/11/2013  . Tonsillectomy  ~ 1961  . Joint replacement    . Pacemaker insertion  06/01/13    Dr. Royann Shivers Medtronic device  . Permanent pacemaker insertion N/A 06/11/2013    Procedure: PERMANENT PACEMAKER INSERTION;  Surgeon: Thurmon Fair, MD;  Location: [facility];  Service: Cardiovascular;  Laterality: N/A;     Current Outpatient Prescriptions  Medication Sig Dispense Refill  . atorvastatin (LIPITOR) 40 MG tablet TAKE 1 TABLET BY MOUTH DAILY AT 6:00 PM. 30 tablet 3  . clopidogrel (PLAVIX) 75 MG tablet TAKE 1 TABLET BY MOUTH DAILY WITH BREAKFAST. 30 tablet 3  . diazepam (  VALIUM) 5 MG tablet Take 5 mg by mouth every 8 (eight) hours as needed for anxiety.     Marland Kitchen esomeprazole (NEXIUM) 40 MG capsule Take 40 mg by mouth 2 (two) times daily before a meal.     . furosemide (LASIX) 40 MG tablet Take 1 tablet (40 mg total) by mouth daily as needed. 30 tablet 6  . glucosamine-chondroitin 500-400 MG tablet Take 1 tablet by mouth 3 (three) times daily.     . magnesium oxide (MAG-OX) 400 MG tablet Take 400 mg by mouth daily.      . metoprolol succinate (TOPROL-XL) 50 MG 24 hr tablet Take 1 tablet (50 mg total) by mouth daily. 30  tablet 7  . Multiple Vitamin (MULITIVITAMIN WITH MINERALS) TABS Take 1 tablet by mouth daily.    Marland Kitchen oxyCODONE (OXY IR/ROXICODONE) 5 MG immediate release tablet Take 5 mg by mouth 3 (three) times daily as needed for moderate pain. For pain    . spironolactone (ALDACTONE) 25 MG tablet Take 1 tablet (25 mg total) by mouth daily with breakfast. 30 tablet 3  . traZODone (DESYREL) 150 MG tablet Take 75 mg by mouth at bedtime.      No current facility-administered medications for this visit.    Allergies:   Aspirin; Tylenol; and Penicillins    Social History:  The patient  reports that she has never smoked. She has never used smokeless tobacco. She reports that she does not drink alcohol or use illicit drugs.   Family History:  The patient's family history includes Dementia (age of onset: 32) in her brother; Heart disease in her mother; Hypertension in her sister.    ROS:  Please see the history of present illness.    Otherwise, review of systems positive for none.   All other systems are reviewed and negative.    PHYSICAL EXAM: VS:  BP 128/73 mmHg  Pulse 69  Resp 16  Ht  (1.499 m)  Wt 142 lb (64.411 kg)  BMI 28.67 kg/m2 , BMI Body mass index is 28.67 kg/(m^2).  General: Alert, oriented x3, no distress Head: no evidence of trauma, PERRL, EOMI, no exophtalmos or lid lag, no myxedema, no xanthelasma; normal ears, nose and oropharynx Neck: normal jugular venous pulsations and no hepatojugular reflux; brisk carotid pulses without delay and no carotid bruits Chest: clear to auscultation, no signs of consolidation by percussion or palpation, normal fremitus, symmetrical and full respiratory excursions,  Healthy left subclavian pacemaker site Cardiovascular: normal position and quality of the apical impulse, regular rhythm, normal first and second heart sounds, no  murmurs, rubs or gallops Abdomen: no tenderness or distention, no masses by palpation, no abnormal pulsatility or arterial  bruits, normal bowel sounds, no hepatosplenomegaly Extremities: no clubbing, cyanosis or edema; 2+ radial, ulnar and brachial pulses bilaterally; 2+ right femoral, posterior tibial and dorsalis pedis pulses; 2+ left femoral, posterior tibial and dorsalis pedis pulses; no subclavian or femoral bruits Neurological: grossly nonfocal Psych: euthymic mood, full affect   EKG:  EKG is ordered today. The ekg ordered today demonstrates  Sinus rhythm, poor anterior R-wave progression, no repolarization abnormalities   Recent Labs: 12/30/2014: ALT 64*; BUN 18; Creat 0.93; Hemoglobin 12.8; Platelets 267; Potassium 4.4; Sodium 137    Lipid Panel    Component Value Date/Time   CHOL 168 05/15/2013 0155   TRIG 123 05/15/2013 0155   HDL 38* 05/15/2013 0155   CHOLHDL 4.4 05/15/2013 0155   VLDL 25 05/15/2013 0155   LDLCALC 105*  05/15/2013 0155      Wt Readings from Last 3 Encounters:  12/30/14 142 lb (64.411 kg)  04/15/14 146 lb (66.225 kg)  03/03/14 153 lb (69.4 kg)       ASSESSMENT AND PLAN:  1.  Recurrent paroxysmal atrial fibrillation with high embolic risk and history of previous stroke. I'm quite worried about the potential for recurrent stroke in this lady, but I'm also very uncertain about the safety of anticoagulation since the setting of advanced liver disease. Will ask Dr. Christella Hartigan opinion regarding the safety of direct oral anticoagulations or warfarin.  rate control during the arrhythmia is adequate.  2.  Primary biliary cirrhosis. She has not really been taking her spironolactone and is taking a very tiny dose of furosemide. This probably explains her edema. We'll adjust her diuretic dose.  3.  Normally functioning dual-chamber permanent pacemaker.    Current medicines are reviewed at length with the patient today.  The patient does not have concerns regarding medicines.  Labs/ tests ordered today include:  Orders Placed This Encounter  Procedures  . CBC  . Comprehensive  metabolic panel  . Protime-INR  . APTT  . Implantable device check  . EKG 12-Lead    Patient Instructions  Medication Instructions:   Increase the Furosemide to 40mg  daily.  A new Rx has been sent to your pharmacy for the new strength.  Take the Spironolactone in the mornings.   Labwork:  At you convenience at [facility].    Follow-Up:  Remote monitoring is used to monitor your Pacemaker or ICD from home. This monitoring reduces the number of office visits required to check your device to one time per year. It allows [place] to monitor the functioning of your device to ensure it is working properly. You are scheduled for a device check from home on April 02, 2015 You may send your transmission at any time that day. If you have a wireless device, the transmission will be sent automatically. After your physician reviews your transmission, you will receive a postcard with your next transmission date.  Dr. Royann Shivers recommends that you schedule a follow-up appointment in: 6 months.            Joie Bimler, MD  01/01/2015 12:52 PM    Thurmon Fair, MD, [facility] [phone] office [phone] pager

## 2014-12-31 LAB — CUP PACEART INCLINIC DEVICE CHECK
Battery Voltage: 3.02 V
Brady Statistic AP VP Percent: 0.02 %
Brady Statistic RA Percent Paced: 32.26 %
Brady Statistic RV Percent Paced: 0.21 %
Lead Channel Impedance Value: 551 Ohm
Lead Channel Impedance Value: 608 Ohm
Lead Channel Pacing Threshold Amplitude: 0.625 V
Lead Channel Pacing Threshold Pulse Width: 0.4 ms
Lead Channel Sensing Intrinsic Amplitude: 16.5 mV
Lead Channel Setting Pacing Pulse Width: 0.4 ms
Lead Channel Setting Sensing Sensitivity: 2.8 mV
MDC IDC MSMT BATTERY REMAINING LONGEVITY: 112 mo
MDC IDC MSMT LEADCHNL RA IMPEDANCE VALUE: 456 Ohm
MDC IDC MSMT LEADCHNL RA IMPEDANCE VALUE: 570 Ohm
MDC IDC MSMT LEADCHNL RA PACING THRESHOLD AMPLITUDE: 0.5 V
MDC IDC MSMT LEADCHNL RA PACING THRESHOLD PULSEWIDTH: 0.4 ms
MDC IDC MSMT LEADCHNL RA SENSING INTR AMPL: 3 mV
MDC IDC MSMT LEADCHNL RA SENSING INTR AMPL: 3 mV
MDC IDC MSMT LEADCHNL RV SENSING INTR AMPL: 16.5 mV
MDC IDC SESS DTM: 20160809144055
MDC IDC SET LEADCHNL RA PACING AMPLITUDE: 1.5 V
MDC IDC SET LEADCHNL RV PACING AMPLITUDE: 2 V
MDC IDC STAT BRADY AP VS PERCENT: 32.24 %
MDC IDC STAT BRADY AS VP PERCENT: 0.19 %
MDC IDC STAT BRADY AS VS PERCENT: 67.56 %
Zone Setting Detection Interval: 400 ms
Zone Setting Detection Interval: 400 ms

## 2014-12-31 LAB — PROTIME-INR
INR: 1.08 (ref <1.50)
Prothrombin Time: 14 seconds (ref 11.6–15.2)

## 2014-12-31 LAB — APTT: aPTT: 30 seconds (ref 24–37)

## 2015-01-01 ENCOUNTER — Encounter: Payer: Self-pay | Admitting: Cardiovascular Disease

## 2015-01-02 ENCOUNTER — Telehealth: Payer: Self-pay | Admitting: Cardiovascular Disease

## 2015-01-02 NOTE — Telephone Encounter (Signed)
  Pt called in wanting to know if Dr. Salena Saner had a chance to speak with her family doctor about her blood work and possibly changing the dosage of her blood thinner.

## 2015-01-02 NOTE — Telephone Encounter (Signed)
I sent a note to her gastroenterologist but have not yet heard back

## 2015-01-02 NOTE — Telephone Encounter (Signed)
Pt called in wanting to know if Dr. Salena Saner had a chance to speak with her family doctor about her blood work and possibly changing the dosage of her blood thinner. Please f/u with her   Thanks

## 2015-01-05 NOTE — Progress Notes (Signed)
She has completely normal liver function (PLTs, INR) and no imaging evidence of signficant liver fibrosis.  I think she is at probably slightly higher than usual bleeding but she should tolerate blood thinners pretty well.

## 2015-01-05 NOTE — Telephone Encounter (Signed)
Called patient and told her that Dr. Royann Shivers has not heard back from her gastroenterologist yet

## 2015-01-08 ENCOUNTER — Other Ambulatory Visit: Payer: Self-pay | Admitting: Cardiovascular Disease

## 2015-01-09 ENCOUNTER — Other Ambulatory Visit: Payer: Self-pay | Admitting: Cardiovascular Disease

## 2015-01-09 NOTE — Telephone Encounter (Signed)
Rx(s) sent to pharmacy electronically.  

## 2015-01-09 NOTE — Telephone Encounter (Signed)
REFILL 

## 2015-01-13 ENCOUNTER — Telehealth: Payer: Self-pay

## 2015-01-13 DIAGNOSIS — K7460 Unspecified cirrhosis of liver: Secondary | ICD-10-CM

## 2015-01-13 NOTE — Telephone Encounter (Signed)
Pt would like to have [place] scheduled (see 04/15/14 note ultrasound of abdomen, ? Cirrhosis changes) for September any day but 2nd or 3rd.  She had an appt but cx due to cost and has since called her insurance company and has decided to have done.  I will schedule on Thursday and call her with the appt info.

## 2015-01-15 ENCOUNTER — Encounter: Payer: Self-pay | Admitting: Cardiovascular Disease

## 2015-01-15 NOTE — Telephone Encounter (Signed)
You have been scheduled for an abdominal ultrasound at [facility] (1st floor of hospital) on 01/27/15 at 830 am. Please arrive 15 minutes prior to your appointment for registration. Make certain not to have anything to eat or drink 6 hours prior to your appointment. Should you need to reschedule your appointment, please contact radiology at [phone]. This test typically takes about 30 minutes to perform.  Pt has been instructed and all questions answered she will call back with further questions

## 2015-01-27 ENCOUNTER — Ambulatory Visit (HOSPITAL_COMMUNITY)
Admission: RE | Admit: 2015-01-27 | Discharge: 2015-01-27 | Disposition: A | Discharge status: Home / Self Care | Payer: Medicare Other | Source: Ambulatory Visit | Attending: Gastroenterology | Admitting: Gastroenterology

## 2015-01-27 DIAGNOSIS — E119 Type 2 diabetes mellitus without complications: Secondary | ICD-10-CM | POA: Diagnosis not present

## 2015-01-27 DIAGNOSIS — R7989 Other specified abnormal findings of blood chemistry: Secondary | ICD-10-CM | POA: Diagnosis not present

## 2015-01-27 DIAGNOSIS — K7460 Unspecified cirrhosis of liver: Secondary | ICD-10-CM

## 2015-01-27 DIAGNOSIS — K743 Primary biliary cirrhosis: Secondary | ICD-10-CM | POA: Insufficient documentation

## 2015-01-27 DIAGNOSIS — E780 Pure hypercholesterolemia: Secondary | ICD-10-CM | POA: Insufficient documentation

## 2015-02-09 ENCOUNTER — Other Ambulatory Visit: Payer: Self-pay | Admitting: Cardiovascular Disease

## 2015-02-09 NOTE — Telephone Encounter (Signed)
Rx(s) sent to pharmacy electronically.  

## 2015-03-02 DIAGNOSIS — M5126 Other intervertebral disc displacement, lumbar region: Secondary | ICD-10-CM | POA: Diagnosis not present

## 2015-03-02 DIAGNOSIS — G8929 Other chronic pain: Secondary | ICD-10-CM | POA: Diagnosis not present

## 2015-03-02 DIAGNOSIS — M546 Pain in thoracic spine: Secondary | ICD-10-CM | POA: Diagnosis not present

## 2015-03-30 ENCOUNTER — Ambulatory Visit: Payer: Medicare Other | Admitting: Gastroenterology

## 2015-03-31 ENCOUNTER — Ambulatory Visit (INDEPENDENT_AMBULATORY_CARE_PROVIDER_SITE_OTHER): Payer: Medicare Other | Admitting: *Deleted

## 2015-03-31 DIAGNOSIS — I495 Sick sinus syndrome: Secondary | ICD-10-CM

## 2015-03-31 NOTE — Progress Notes (Signed)
Remote pacemaker transmission.   

## 2015-04-01 LAB — CUP PACEART REMOTE DEVICE CHECK
Battery Remaining Longevity: 110 mo
Battery Voltage: 3.02 V
Brady Statistic AP VP Percent: 0.03 %
Brady Statistic RA Percent Paced: 40.58 %
Brady Statistic RV Percent Paced: 0.22 %
Implantable Lead Implant Date: 20150120
Implantable Lead Location: 753859
Implantable Lead Model: 5076
Lead Channel Impedance Value: 437 Ohm
Lead Channel Impedance Value: 551 Ohm
Lead Channel Pacing Threshold Amplitude: 0.625 V
Lead Channel Pacing Threshold Pulse Width: 0.4 ms
Lead Channel Pacing Threshold Pulse Width: 0.4 ms
Lead Channel Sensing Intrinsic Amplitude: 2.5 mV
Lead Channel Setting Pacing Amplitude: 1.5 V
Lead Channel Setting Pacing Pulse Width: 0.4 ms
Lead Channel Setting Sensing Sensitivity: 2.8 mV
MDC IDC LEAD IMPLANT DT: 20150120
MDC IDC LEAD LOCATION: 753860
MDC IDC MSMT LEADCHNL RA SENSING INTR AMPL: 2.5 mV
MDC IDC MSMT LEADCHNL RV IMPEDANCE VALUE: 551 Ohm
MDC IDC MSMT LEADCHNL RV IMPEDANCE VALUE: 608 Ohm
MDC IDC MSMT LEADCHNL RV PACING THRESHOLD AMPLITUDE: 0.75 V
MDC IDC MSMT LEADCHNL RV SENSING INTR AMPL: 15.375 mV
MDC IDC MSMT LEADCHNL RV SENSING INTR AMPL: 15.375 mV
MDC IDC SESS DTM: 20161108143719
MDC IDC SET LEADCHNL RV PACING AMPLITUDE: 2 V
MDC IDC STAT BRADY AP VS PERCENT: 40.55 %
MDC IDC STAT BRADY AS VP PERCENT: 0.19 %
MDC IDC STAT BRADY AS VS PERCENT: 59.23 %

## 2015-04-03 ENCOUNTER — Encounter: Payer: Self-pay | Admitting: Cardiology

## 2015-04-03 ENCOUNTER — Encounter: Payer: Self-pay | Admitting: Physician Assistant

## 2015-04-03 ENCOUNTER — Ambulatory Visit (INDEPENDENT_AMBULATORY_CARE_PROVIDER_SITE_OTHER): Payer: Medicare Other | Admitting: Physician Assistant

## 2015-04-03 ENCOUNTER — Other Ambulatory Visit (INDEPENDENT_AMBULATORY_CARE_PROVIDER_SITE_OTHER): Payer: Medicare Other

## 2015-04-03 VITALS — BP 122/72 | HR 80 | Ht 59.0 in | Wt 142.0 lb

## 2015-04-03 DIAGNOSIS — K743 Primary biliary cirrhosis: Secondary | ICD-10-CM

## 2015-04-03 DIAGNOSIS — K219 Gastro-esophageal reflux disease without esophagitis: Secondary | ICD-10-CM

## 2015-04-03 LAB — COMPREHENSIVE METABOLIC PANEL
ALT: 37 U/L — ABNORMAL HIGH (ref 0–35)
AST: 55 U/L — ABNORMAL HIGH (ref 0–37)
Albumin: 3.5 g/dL (ref 3.5–5.2)
Alkaline Phosphatase: 428 U/L — ABNORMAL HIGH (ref 39–117)
BUN: 23 mg/dL (ref 6–23)
CALCIUM: 9.3 mg/dL (ref 8.4–10.5)
CHLORIDE: 99 meq/L (ref 96–112)
CO2: 35 mEq/L — ABNORMAL HIGH (ref 19–32)
CREATININE: 1.05 mg/dL (ref 0.40–1.20)
GFR: 53.58 mL/min — AB (ref >60.00)
Glucose, Bld: 120 mg/dL — ABNORMAL HIGH (ref 70–99)
Potassium: 3.6 mEq/L (ref 3.5–5.1)
Sodium: 138 mEq/L (ref 135–145)
Total Bilirubin: 0.5 mg/dL (ref 0.2–1.2)
Total Protein: 7.2 g/dL (ref 6.0–8.3)

## 2015-04-03 LAB — PROTIME-INR
INR: 1.1 ratio — AB (ref 0.8–1.0)
PROTHROMBIN TIME: 12.1 s (ref 9.6–13.1)

## 2015-04-03 LAB — AMMONIA: AMMONIA: 29 umol/L (ref 11–35)

## 2015-04-03 MED ORDER — PANTOPRAZOLE SODIUM 40 MG PO TBEC: 40.0000 mg | DELAYED_RELEASE_TABLET | Freq: Every day | ORAL | Status: DC

## 2015-04-03 NOTE — Progress Notes (Signed)
Patient ID: Diamond Miller, female   DOB: 02-Dec-1932, 81 y.o.   MRN: [id number]   Subjective:    Patient ID: Diamond Miller, female    DOB: 25-Dec-1932, 81 y.o.   MRN: [id number]  HPI  Diamond Miller  is an 81 year old white female known to Dr. Christella Hartigan with history of primary biliary cirrhosis. As last seen here about 1 year ago. She had been tried on Urso but was intolerant with significant diarrhea. Patient has sig other significant medical problems including atrial fibrillation, a CVA about 1 year ago for which she is now on Plavix, tachybradycardia syndrome status post pacemaker placement, hypertension, and adult-onset diabetes mellitus. Appointment was made to review recent ultrasound but patient's primary complaint today is heartburn and indigestion. She says she was switched to generic Nexium twice daily and that it doesn't work very well at all. She would like to be on a different medication. She denies any dysphagia or odynophagia. She seems to understand that she has cirrhosis but doesn't understand she got it or why we would follow her. Upper abdominal ultrasound in September 2016 showed an irregular contour of the liver consistent with cirrhotic changes there were no masses, spleen normal size common bile duct 10 mm and gallbladder unremarkable. Most recent labs done in August 2016 ProTime 14 INR of 1.08 total bilirubin 0.6 alkaline phosphatase 570 AST of 93 a LT of 64 to VBC 9.8 hemoglobin 12.8 and platelets 267. She has not had EGD  Review of Systems Pertinent positive and negative review of systems were noted in the above HPI section.  All other review of systems was otherwise negative.  Outpatient Encounter Prescriptions as of 04/03/2015  Medication Sig  . Ascorbic Acid (VITAMIN C) 1000 MG tablet Take 1,000 mg by mouth daily.  Marland Kitchen atorvastatin (LIPITOR) 40 MG tablet TAKE 1 TABLET BY MOUTH DAILY AT 6:00 PM.  . clopidogrel (PLAVIX) 75 MG tablet TAKE 1 TABLET BY MOUTH DAILY WITH BREAKFAST.    . diazepam (VALIUM) 5 MG tablet Take 5 mg by mouth every 8 (eight) hours as needed for anxiety.   Marland Kitchen esomeprazole (NEXIUM) 40 MG capsule Take 40 mg by mouth 2 (two) times daily before a meal.   . furosemide (LASIX) 40 MG tablet Take 1 tablet (40 mg total) by mouth daily as needed.  Marland Kitchen glucosamine-chondroitin 500-400 MG tablet Take 1 tablet by mouth 3 (three) times daily.   . magnesium oxide (MAG-OX) 400 MG tablet Take 400 mg by mouth daily.    . metoprolol succinate (TOPROL-XL) 50 MG 24 hr tablet TAKE 1 TABLET (50 MG TOTAL) BY MOUTH DAILY.  . Multiple Vitamin (MULITIVITAMIN WITH MINERALS) TABS Take 1 tablet by mouth daily.  Marland Kitchen oxyCODONE (OXY IR/ROXICODONE) 5 MG immediate release tablet Take 5 mg by mouth 3 (three) times daily as needed for moderate pain. For pain  . spironolactone (ALDACTONE) 25 MG tablet Take 1 tablet (25 mg total) by mouth daily with breakfast.  . traZODone (DESYREL) 150 MG tablet Take 75 mg by mouth at bedtime.   . pantoprazole (PROTONIX) 40 MG tablet Take 1 tablet (40 mg total) by mouth daily.   No facility-administered encounter medications on file as of 04/03/2015.   Allergies  Allergen Reactions  . Aspirin Other (See Comments)    Liver function  . Tylenol [Acetaminophen] Other (See Comments)    Liver function   . Penicillins Rash   Patient Active Problem List   Diagnosis Date Noted  . Bilateral leg edema 12/24/2013  .  Pacemaker, Medtronic placed 06/11/13 06/11/2013  . Tachy-brady syndrome (HCC) 05/27/2013  . Paroxysmal atrial fibrillation, has not been felt to be anticoagulation candidate secondary to freq falls and biliary cirhosis   05/27/2013  . Sinus pause 05/27/2013  . Elevated troponin- negative Myoview, Nl LVF 05/24/2013  . Demand ischemia (HCC) 05/19/2013  . Stroke Lt brain-05/14/13- probably embolic 05/14/2013  . Altered mental status 05/14/2013  . LEG EDEMA, BILATERAL 09/07/2010  . BILIARY CIRRHOSIS, PRIMARY 06/01/2010  . NONSPEC ELEVATION OF LEVELS  OF TRANSAMINASE/LDH 03/19/2010  . CHEST WALL PAIN, ANTERIOR 02/26/2009  . Diabetes (HCC) 08/25/2008  . UNSPECIFIED IRON DEFICIENCY ANEMIA 08/25/2008  . HYPERTENSION 08/25/2008  . Esophageal reflux 08/25/2008  . NAUSEA ALONE 08/25/2008   Social History   Social History  . Marital Status: Widowed    Spouse Name: N/A  . Number of Children: N/A  . Years of Education: N/A   Occupational History  . Not on file.   Social History Main Topics  . Smoking status: Never Smoker   . Smokeless tobacco: Never Used  . Alcohol Use: No  . Drug Use: No  . Sexual Activity: No   Other Topics Concern  . Not on file   Social History Narrative    Ms. Piatt's family history includes Dementia (age of onset: 4160) in her brother; Heart disease in her mother; Hypertension in her sister.      Objective:    Filed Vitals:   04/03/15 1438  BP: 122/72  Pulse: 80    Physical Exam   well-developed elderly white female in no acute distress, ambulates with a walker, pleasant but seems to be somewhat confused about why she is here blood pressure 122/72 pulse 80 height 4 foot 11 weight 142 HEENT ;nontraumatic normocephalic EOMI PERRLA sclera anicteric, Cardiovascular ;regular rate and rhythm with S1-S2 no murmur rub or gallop, Pulmonary; clear bilaterally, Abdomen; large soft nontender nondistended bowel sounds are present there is no palpable mass or hepatosplenomegaly no fluid wave, Rectal exam not done, Extremities; shows 1+ edema bilaterally in the ankles, Neuropsych ;mood and affect appropriate but patient rambles, no asterixis       Assessment & Plan:   #1  81 yo female with primary biliary cirrhosis- compensated - but has had progression on US over past couple years #2 Chronic GERD #3 atrial fib  #4 Hx CVA 512015 #5 HTN #6 s/p pacemaker #7 AODM #8 ?dementia  Plan; Pt should have EGD at some point- she is not anxious to proceed with this and does not have transportation  Will switch from  generic Nexium to Protonix 40 mg po BID Check AFP, PT, hepatic panel and ammonia  She will follow up with Dr Christella HartiganJacobs in 3 months   Davis Ambrosini Oswald HillockS Janari Yamada PA-C 04/03/2015   Cc: Lorie PhenixMaloney, Nancy, MD

## 2015-04-03 NOTE — Progress Notes (Signed)
i agree with the above note, plan 

## 2015-04-03 NOTE — Patient Instructions (Signed)
You have primary biliary cirrhosis.  We have sent the following medications to your pharmacy for you to pick up at your convenience: Protonix 40 mg twice a day.   We have made you a follow up we Dr. Christella HartiganJacobs in January.

## 2015-04-04 LAB — AFP TUMOR MARKER: AFP-Tumor Marker: 2 ng/mL (ref <6.1)

## 2015-04-07 ENCOUNTER — Telehealth: Payer: Self-pay | Admitting: Family Medicine

## 2015-04-07 NOTE — Telephone Encounter (Signed)
Did you try to call her?  Thanks,   -Vernona RiegerLaura

## 2015-04-07 NOTE — Telephone Encounter (Signed)
No.  Thanks

## 2015-04-07 NOTE — Telephone Encounter (Signed)
Pt states she is returning a call from our office.  ZO#[phone]/WJCB#[phone]/MW

## 2015-04-08 ENCOUNTER — Ambulatory Visit (INDEPENDENT_AMBULATORY_CARE_PROVIDER_SITE_OTHER): Payer: Medicare Other | Admitting: Family Medicine

## 2015-04-08 ENCOUNTER — Encounter: Payer: Self-pay | Admitting: Family Medicine

## 2015-04-08 VITALS — BP 112/64 | HR 72 | Temp 98.6°F | Resp 16 | Wt 146.0 lb

## 2015-04-08 DIAGNOSIS — E785 Hyperlipidemia, unspecified: Secondary | ICD-10-CM | POA: Insufficient documentation

## 2015-04-08 DIAGNOSIS — M1990 Unspecified osteoarthritis, unspecified site: Secondary | ICD-10-CM | POA: Insufficient documentation

## 2015-04-08 DIAGNOSIS — F4322 Adjustment disorder with anxiety: Secondary | ICD-10-CM | POA: Diagnosis not present

## 2015-04-08 DIAGNOSIS — E871 Hypo-osmolality and hyponatremia: Secondary | ICD-10-CM | POA: Insufficient documentation

## 2015-04-08 DIAGNOSIS — H9201 Otalgia, right ear: Secondary | ICD-10-CM

## 2015-04-08 DIAGNOSIS — G4700 Insomnia, unspecified: Secondary | ICD-10-CM | POA: Insufficient documentation

## 2015-04-08 DIAGNOSIS — K219 Gastro-esophageal reflux disease without esophagitis: Secondary | ICD-10-CM | POA: Insufficient documentation

## 2015-04-08 DIAGNOSIS — IMO0002 Reserved for concepts with insufficient information to code with codable children: Secondary | ICD-10-CM | POA: Insufficient documentation

## 2015-04-08 DIAGNOSIS — J309 Allergic rhinitis, unspecified: Secondary | ICD-10-CM

## 2015-04-08 DIAGNOSIS — F4320 Adjustment disorder, unspecified: Secondary | ICD-10-CM | POA: Insufficient documentation

## 2015-04-08 DIAGNOSIS — M8580 Other specified disorders of bone density and structure, unspecified site: Secondary | ICD-10-CM | POA: Insufficient documentation

## 2015-04-08 DIAGNOSIS — R091 Pleurisy: Secondary | ICD-10-CM | POA: Insufficient documentation

## 2015-04-08 DIAGNOSIS — K743 Primary biliary cirrhosis: Secondary | ICD-10-CM | POA: Insufficient documentation

## 2015-04-08 DIAGNOSIS — H9209 Otalgia, unspecified ear: Secondary | ICD-10-CM | POA: Insufficient documentation

## 2015-04-08 DIAGNOSIS — S322XXA Fracture of coccyx, initial encounter for closed fracture: Secondary | ICD-10-CM | POA: Insufficient documentation

## 2015-04-08 DIAGNOSIS — I10 Essential (primary) hypertension: Secondary | ICD-10-CM | POA: Insufficient documentation

## 2015-04-08 DIAGNOSIS — I639 Cerebral infarction, unspecified: Secondary | ICD-10-CM | POA: Insufficient documentation

## 2015-04-08 DIAGNOSIS — R600 Localized edema: Secondary | ICD-10-CM | POA: Insufficient documentation

## 2015-04-08 DIAGNOSIS — K5900 Constipation, unspecified: Secondary | ICD-10-CM | POA: Insufficient documentation

## 2015-04-08 DIAGNOSIS — R42 Dizziness and giddiness: Secondary | ICD-10-CM

## 2015-04-08 DIAGNOSIS — W19XXXA Unspecified fall, initial encounter: Secondary | ICD-10-CM | POA: Insufficient documentation

## 2015-04-08 DIAGNOSIS — R5383 Other fatigue: Secondary | ICD-10-CM | POA: Insufficient documentation

## 2015-04-08 MED ORDER — ESOMEPRAZOLE MAGNESIUM 40 MG PO CPDR: 40.0000 mg | DELAYED_RELEASE_CAPSULE | Freq: Every day | ORAL | Status: DC

## 2015-04-08 MED ORDER — CETIRIZINE HCL 10 MG PO TABS: 10.0000 mg | ORAL_TABLET | Freq: Every day | ORAL | Status: DC

## 2015-04-08 MED ORDER — DIAZEPAM 5 MG PO TABS: 5.0000 mg | ORAL_TABLET | Freq: Every day | ORAL | Status: DC

## 2015-04-08 NOTE — Progress Notes (Signed)
Patient ID: Diamond Miller, female   DOB: [birth year], 81 y.o.   MRN: [id number]        Patient: Diamond RuddleClara L Miller Female    DOB: [birth year]   81 y.o.   MRN: [id number] Visit Date: 04/08/2015  Today's Provider: Lorie PhenixNancy Franz Svec, MD   Chief Complaint  Patient presents with  . Ear Pain  . Dizziness   Subjective:    Dizziness The current episode started 1 to 4 weeks ago. The problem occurs daily. The problem has been gradually improving. Associated symptoms include arthralgias, congestion, coughing, fatigue and vertigo. Pertinent negatives include no abdominal pain, chest pain, chills, diaphoresis, fever, headaches, myalgias, nausea, neck pain, numbness, rash, sore throat or weakness.  Otalgia  There is pain in the right ear. This is a new problem. The current episode started 1 to 4 weeks ago. The problem has been gradually improving. There has been no fever. Associated symptoms include coughing, ear discharge and rhinorrhea. Pertinent negatives include no abdominal pain, diarrhea, headaches, hearing loss, neck pain, rash or sore throat. Treatments tried: Benadryl. The treatment provided mild relief.       Allergies  Allergen Reactions  . Aspirin Other (See Comments)    Liver function  . Tylenol [Acetaminophen] Other (See Comments)    Liver function   . Penicillins Rash   Previous Medications   ASCORBIC ACID (VITAMIN C) 1000 MG TABLET    Take 1,000 mg by mouth daily.   ATORVASTATIN (LIPITOR) 40 MG TABLET    TAKE 1 TABLET BY MOUTH DAILY AT 6:00 PM.   CLOPIDOGREL (PLAVIX) 75 MG TABLET    TAKE 1 TABLET BY MOUTH DAILY WITH BREAKFAST.   DIAZEPAM (VALIUM) 5 MG TABLET    Take 5 mg by mouth every 8 (eight) hours as needed for anxiety.    ESOMEPRAZOLE (NEXIUM) 40 MG CAPSULE    Take 40 mg by mouth 2 (two) times daily before a meal.    FUROSEMIDE (LASIX) 40 MG TABLET    Take 1 tablet (40 mg total) by mouth daily as needed.   GLUCOSAMINE-CHONDROITIN 500-400 MG TABLET    Take 1 tablet by  mouth 3 (three) times daily.    MAGNESIUM OXIDE (MAG-OX) 400 MG TABLET    Take 400 mg by mouth daily.     METOPROLOL SUCCINATE (TOPROL-XL) 50 MG 24 HR TABLET    TAKE 1 TABLET (50 MG TOTAL) BY MOUTH DAILY.   MULTIPLE VITAMIN (MULITIVITAMIN WITH MINERALS) TABS    Take 1 tablet by mouth daily.   OXYCODONE (OXY IR/ROXICODONE) 5 MG IMMEDIATE RELEASE TABLET    Take 5 mg by mouth 3 (three) times daily as needed for moderate pain. For pain   PANTOPRAZOLE (PROTONIX) 40 MG TABLET    Take 1 tablet (40 mg total) by mouth daily.   SPIRONOLACTONE (ALDACTONE) 25 MG TABLET    Take 1 tablet (25 mg total) by mouth daily with breakfast.   TRAZODONE (DESYREL) 150 MG TABLET    Take 75 mg by mouth at bedtime.     Review of Systems  Constitutional: Positive for fatigue. Negative for fever, chills, diaphoresis, activity change, appetite change and unexpected weight change.  HENT: Positive for congestion, ear discharge, ear pain (Right Side) and rhinorrhea. Negative for hearing loss, mouth sores, nosebleeds, postnasal drip, sinus pressure, sneezing, sore throat, tinnitus, trouble swallowing and voice change.   Respiratory: Positive for cough. Negative for apnea, choking, chest tightness, shortness of breath, wheezing and stridor.   Cardiovascular: Positive for leg  swelling. Negative for chest pain and palpitations.  Gastrointestinal: Negative.  Negative for nausea, abdominal pain and diarrhea.  Musculoskeletal: Positive for back pain, arthralgias and gait problem (Walks with a walker). Negative for myalgias, neck pain and neck stiffness.  Skin: Negative for rash.  Allergic/Immunologic: Positive for environmental allergies.  Neurological: Positive for dizziness and vertigo. Negative for tremors, seizures, syncope, facial asymmetry, speech difficulty, weakness, light-headedness, numbness and headaches.    Social History  Substance Use Topics  . Smoking status: Never Smoker   . Smokeless tobacco: Never Used  . Alcohol  Use: No   Objective:   BP 112/64 mmHg  Pulse 72  Temp(Src) 98.6 F (37 C) (Oral)  Resp 16  Wt 146 lb (66.225 kg)  Physical Exam  Constitutional: She appears well-developed and well-nourished.  HENT:  Head: Normocephalic.  Left Ear: Tympanic membrane, external ear and ear canal normal.  Nose: Nose normal.  Mouth/Throat: Uvula is midline, oropharynx is clear and moist and mucous membranes are normal.  Right TM Dull    Neurological: She is alert. No cranial nerve deficit.  Psychiatric: She has a normal mood and affect. Her behavior is normal. Judgment and thought content normal.        Assessment & Plan:       1. Right ear pain Ear ok today.    2. Dizziness Could be secondary to right inner ear.  Will try Zyrtec and recheck in four weeks.   3. Gastroesophageal reflux disease without esophagitis Failed Protonix will try Nexium again.  - esomeprazole (NEXIUM) 40 MG capsule; Take 1 capsule (40 mg total) by mouth daily.  Dispense: 30 capsule; Refill: 3  4. Adjustment disorder with anxious mood Advised pt to decease Valium or stop it all together.  The Valium could be making her unsteady/dizzy.  - diazepam (VALIUM) 5 MG tablet; Take 1 tablet (5 mg total) by mouth daily.  Dispense: 30 tablet; Refill: 5  5. Allergic rhinitis, unspecified allergic rhinitis type Will stop Benadryl and change to Zyrtec.  Recheck in four weeks.   - cetirizine (ZYRTEC) 10 MG tablet; Take 1 tablet (10 mg total) by mouth at bedtime.  Dispense: 30 tablet; Refill: 5      Patient was seen and examined by Leo Grosser, MD, and note scribed by Kavin Leech, CMA.  I have reviewed the document for accuracy and completeness and I agree with above. - Leo Grosser, MD   Lorie Phenix, MD  [facility]

## 2015-05-05 DIAGNOSIS — G894 Chronic pain syndrome: Secondary | ICD-10-CM | POA: Diagnosis not present

## 2015-05-05 DIAGNOSIS — M546 Pain in thoracic spine: Secondary | ICD-10-CM | POA: Diagnosis not present

## 2015-05-05 DIAGNOSIS — Z79891 Long term (current) use of opiate analgesic: Secondary | ICD-10-CM | POA: Diagnosis not present

## 2015-05-05 DIAGNOSIS — M5136 Other intervertebral disc degeneration, lumbar region: Secondary | ICD-10-CM | POA: Diagnosis not present

## 2015-05-11 ENCOUNTER — Encounter: Payer: Self-pay | Admitting: Family Medicine

## 2015-05-11 ENCOUNTER — Ambulatory Visit (INDEPENDENT_AMBULATORY_CARE_PROVIDER_SITE_OTHER): Payer: Medicare Other | Admitting: Family Medicine

## 2015-05-11 VITALS — BP 128/72 | HR 80 | Temp 97.6°F | Resp 16 | Wt 142.0 lb

## 2015-05-11 DIAGNOSIS — I10 Essential (primary) hypertension: Secondary | ICD-10-CM

## 2015-05-11 DIAGNOSIS — J309 Allergic rhinitis, unspecified: Secondary | ICD-10-CM | POA: Diagnosis not present

## 2015-05-11 DIAGNOSIS — K743 Primary biliary cirrhosis: Secondary | ICD-10-CM

## 2015-05-11 DIAGNOSIS — K745 Biliary cirrhosis, unspecified: Secondary | ICD-10-CM | POA: Diagnosis not present

## 2015-05-11 DIAGNOSIS — F4322 Adjustment disorder with anxiety: Secondary | ICD-10-CM

## 2015-05-11 MED ORDER — DIAZEPAM 5 MG PO TABS: 2.5000 mg | ORAL_TABLET | Freq: Two times a day (BID) | ORAL | Status: DC | PRN

## 2015-05-11 NOTE — Progress Notes (Signed)
Subjective:    Patient ID: Diamond Miller, female    DOB: 1932-09-22, 81 y.o.   MRN: [id number]   Allergic Rhinitis: Diamond Miller is here for evaluation of possible allergic rhinitis. Patient's symptoms include clear rhinorrhea, cough, headaches, nasal congestion and postnasal drip. These symptoms are seasonal. Current triggers include exposure to no known precipitant. The patient has been suffering from these symptoms for approximately several  months. The patient has tried over the counter medications with good relief of symptoms. Pt D/C Benadryl at LOV due to dizziness, and started Zyrtec. Pt reports the Zyrtec caused a rash, and is no longer taking any allergy medication.  Still more weak feeling.  May not be getting enough protein. Has to watch her sodium pretty closely secondary to heart.     Anxiety Presents for follow-up (Decreased Valium at LOV because it is possibly causing dizziness/unsteadiness) visit. The problem has been gradually worsening. Symptoms include dizziness (not improved with decreasing Valium), dry mouth, malaise and nervous/anxious behavior. Patient reports no chest pain, compulsions, confusion, decreased concentration, depressed mood, excessive worry, feeling of choking, hyperventilation, insomnia, irritability, muscle tension, nausea, obsessions, palpitations, panic, restlessness, shortness of breath or suicidal ideas. Symptoms occur constantly.   Past treatments include benzodiazephines. Compliance with prior treatments has been good. Compliance with medications is 76-100% (Is very upset to be off her Valium. Has been on it for at  leastt 20 years. Does have worsening liver issues. ).     Review of Systems  Constitutional: Negative for irritability.  Respiratory: Negative for shortness of breath.   Cardiovascular: Negative for chest pain and palpitations.  Gastrointestinal: Negative for nausea.  Skin: Positive for rash (resolved. ).  Neurological:  Positive for dizziness (not improved with decreasing Valium).  Psychiatric/Behavioral: Negative for suicidal ideas, confusion and decreased concentration. The patient is nervous/anxious. The patient does not have insomnia.    BP 128/72 mmHg  Pulse 80  Temp(Src) 97.6 F (36.4 C) (Oral)  Resp 16  Wt 142 lb (64.411 kg)   Patient Active Problem List   Diagnosis Date Noted  . Adaptation reaction 04/08/2015  . Allergic rhinitis 04/08/2015  . Blocked tear duct 04/08/2015  . CN (constipation) 04/08/2015  . Cerebral infarct (HCC) 04/08/2015  . Ear ache 04/08/2015  . Edema extremities 04/08/2015  . Fall 04/08/2015  . Fatigue 04/08/2015  . Coccygeal fracture (HCC) 04/08/2015  . Acid reflux 04/08/2015  . HLD (hyperlipidemia) 04/08/2015  . BP (high blood pressure) 04/08/2015  . Hypomagnesemia 04/08/2015  . Below normal amount of sodium in the blood 04/08/2015  . Cannot sleep 04/08/2015  . Arthritis, degenerative 04/08/2015  . Osteopenia 04/08/2015  . Pleurisy 04/08/2015  . Cirrhosis, primary biliary (HCC) 04/08/2015  . Bilateral leg edema 12/24/2013  . Pacemaker, Medtronic placed 06/11/13 06/11/2013  . Tachy-brady syndrome (HCC) 05/27/2013  . Paroxysmal atrial fibrillation, has not been felt to be anticoagulation candidate secondary to freq falls and biliary cirhosis   05/27/2013  . Sinus pause 05/27/2013  . Elevated troponin- negative Myoview, Nl LVF 05/24/2013  . Demand ischemia (HCC) 05/19/2013  . Stroke Lt brain-05/14/13- probably embolic 05/14/2013  . Altered mental status 05/14/2013  . LEG EDEMA, BILATERAL 09/07/2010  . BILIARY CIRRHOSIS, PRIMARY 06/01/2010  . NONSPEC ELEVATION OF LEVELS OF TRANSAMINASE/LDH 03/19/2010  . CHEST WALL PAIN, ANTERIOR 02/26/2009  . Diabetes (HCC) 08/25/2008  . UNSPECIFIED IRON DEFICIENCY ANEMIA 08/25/2008  . HYPERTENSION 08/25/2008  . Esophageal reflux 08/25/2008  . NAUSEA ALONE 08/25/2008   Past Medical  History  Diagnosis Date  . Scoliosis     . Liver cirrhosis (HCC)   . Hypertension   . Acid reflux   . High cholesterol   . Dysrhythmia     "skips" (06/12/2013)  . COPD (chronic obstructive pulmonary disease) (HCC)   . Exertional shortness of breath     "recently" (06/12/2013)  . Anemia 1957  . History of blood transfusion     "think it was when I had knee OR" (06/12/2013)  . Stroke (HCC) 05/14/2013    denies residual on 06/12/2013  . Degenerative disc disease, cervical   . Chronic back pain   . Anxiety   . Pacemaker, Medtronic placed 06/01/13 06/11/2013   Current Outpatient Prescriptions on File Prior to Visit  Medication Sig  . atorvastatin (LIPITOR) 40 MG tablet TAKE 1 TABLET BY MOUTH DAILY AT 6:00 PM.  . clopidogrel (PLAVIX) 75 MG tablet TAKE 1 TABLET BY MOUTH DAILY WITH BREAKFAST.  . diazepam (VALIUM) 5 MG tablet Take 1 tablet (5 mg total) by mouth daily.  . furosemide (LASIX) 40 MG tablet Take 1 tablet (40 mg total) by mouth daily as needed.  Marland Kitchen. glucosamine-chondroitin 500-400 MG tablet Take 1 tablet by mouth 3 (three) times daily.   . magnesium oxide (MAG-OX) 400 MG tablet Take 400 mg by mouth daily.    . metoprolol succinate (TOPROL-XL) 50 MG 24 hr tablet TAKE 1 TABLET (50 MG TOTAL) BY MOUTH DAILY.  . Multiple Vitamin (MULITIVITAMIN WITH MINERALS) TABS Take 1 tablet by mouth daily.  Marland Kitchen. oxyCODONE (OXY IR/ROXICODONE) 5 MG immediate release tablet Take 5 mg by mouth 3 (three) times daily as needed for moderate pain. For pain  . traZODone (DESYREL) 150 MG tablet Take 75 mg by mouth at bedtime.   . Ascorbic Acid (VITAMIN C) 1000 MG tablet Take 1,000 mg by mouth daily. Reported on 05/11/2015  . esomeprazole (NEXIUM) 40 MG capsule Take 40 mg by mouth 2 (two) times daily before a meal. Reported on 05/11/2015  . spironolactone (ALDACTONE) 25 MG tablet Take 1 tablet (25 mg total) by mouth daily with breakfast. (Patient not taking: Reported on 05/11/2015)   No current facility-administered medications on file prior to visit.    Allergies  Allergen Reactions  . Aspirin Other (See Comments)    Liver function  . Tylenol [Acetaminophen] Other (See Comments)    Liver function   . Penicillins Rash  . Zyrtec [Cetirizine] Rash   Past Surgical History  Procedure Laterality Date  . Replacement total knee Right 2010  . Cataract extraction w/ intraocular lens  implant, bilateral Bilateral 2006-2007  . Tee without cardioversion N/A 05/17/2013    Procedure: TRANSESOPHAGEAL ECHOCARDIOGRAM (TEE);  Surgeon: Vesta MixerPhilip J Nahser, MD;  [facility];  Service: Cardiovascular;  Laterality: N/A;  . Insert / replace / remove pacemaker  06/11/2013  . Tonsillectomy  ~ 1961  . Joint replacement    . Pacemaker insertion  06/01/13    Dr. Royann Shiversroitoru/ Medtronic device  . Permanent pacemaker insertion N/A 06/11/2013    Procedure: PERMANENT PACEMAKER INSERTION;  Surgeon: Thurmon FairMihai Croitoru, MD;  Location: [facility] CATH LAB;  Service: Cardiovascular;  Laterality: N/A;   Social History   Social History  . Marital Status: Widowed    Spouse Name: N/A  . Number of Children: N/A  . Years of Education: N/A   Occupational History  . Not on file.   Social History Main Topics  . Smoking status: Never Smoker   . Smokeless tobacco: Never Used  .  Alcohol Use: No  . Drug Use: No  . Sexual Activity: No   Other Topics Concern  . Not on file   Social History Narrative   Family History  Problem Relation Age of Onset  . Heart disease Mother   . Hypertension Sister   . Dementia Brother 60      Objective:   Physical Exam  Constitutional: She is oriented to person, place, and time. She appears well-developed and well-nourished.  HENT:  Head: Normocephalic and atraumatic.  Right Ear: External ear normal.  Left Ear: External ear normal.  Mouth/Throat: Oropharynx is clear and moist.  Eyes: Conjunctivae and EOM are normal. Pupils are equal, round, and reactive to light.  Neck: Normal range of motion. Neck supple.  Cardiovascular: Normal  rate and regular rhythm.   Pulmonary/Chest: Effort normal and breath sounds normal.  Musculoskeletal:  Antalgic gait.   Neurological: She is alert and oriented to person, place, and time.  Psychiatric: She has a normal mood and affect. Her behavior is normal. Judgment and thought content normal.  BP 128/72 mmHg  Pulse 80  Temp(Src) 97.6 F (36.4 C) (Oral)  Resp 16  Wt 142 lb (64.411 kg)     Assessment & Plan:  1. Adjustment disorder with anxious mood Has been on Valium for a long time. Does not want to decrease. Is now 5 and also with liver issues. Will  - diazepam (VALIUM) 5 MG tablet; Take 0.5 tablets (2.5 mg total) by mouth every 12 (twelve) hours as needed for anxiety.  Dispense: 30 tablet; Refill: 5  2. Cirrhosis, primary biliary (HCC) Followed by specialist, but is worsening.    3. Essential hypertension Stable. Continue medication.     4. Allergic rhinitis, unspecified allergic rhinitis type Unchanged. Continue current medication.   Lorie Phenix, MD

## 2015-05-12 ENCOUNTER — Telehealth: Payer: Self-pay

## 2015-05-12 NOTE — Telephone Encounter (Signed)
Pt returned your call.   ° °Thanks, teri °

## 2015-05-12 NOTE — Telephone Encounter (Signed)
Dr. Elease HashimotoMaloney asking where diabetes diagnosis in EMR came from. LMTCB. Allene DillonEmily Drozdowski, CMA

## 2015-05-13 NOTE — Telephone Encounter (Signed)
LMTCB Remiel Corti Drozdowski, CMA  

## 2015-05-13 NOTE — Telephone Encounter (Signed)
Patient returned call

## 2015-05-13 NOTE — Telephone Encounter (Signed)
Pt does not know where DM dx came from. Past FBS and A1C in EMR is WNL. Allene DillonEmily Drozdowski, CMA

## 2015-06-10 ENCOUNTER — Ambulatory Visit: Payer: Medicare Other | Admitting: Gastroenterology

## 2015-06-30 ENCOUNTER — Encounter: Payer: Self-pay | Admitting: Cardiovascular Disease

## 2015-06-30 ENCOUNTER — Ambulatory Visit (INDEPENDENT_AMBULATORY_CARE_PROVIDER_SITE_OTHER): Payer: Medicare Other | Admitting: Cardiovascular Disease

## 2015-06-30 VITALS — BP 140/84 | HR 70 | Ht 59.0 in | Wt 141.5 lb

## 2015-06-30 DIAGNOSIS — Z79899 Other long term (current) drug therapy: Secondary | ICD-10-CM

## 2015-06-30 DIAGNOSIS — I10 Essential (primary) hypertension: Secondary | ICD-10-CM

## 2015-06-30 DIAGNOSIS — I455 Other specified heart block: Secondary | ICD-10-CM

## 2015-06-30 DIAGNOSIS — I480 Paroxysmal atrial fibrillation: Secondary | ICD-10-CM

## 2015-06-30 DIAGNOSIS — Z950 Presence of cardiac pacemaker: Secondary | ICD-10-CM

## 2015-06-30 DIAGNOSIS — I495 Sick sinus syndrome: Secondary | ICD-10-CM | POA: Diagnosis not present

## 2015-06-30 DIAGNOSIS — I4891 Unspecified atrial fibrillation: Secondary | ICD-10-CM | POA: Diagnosis not present

## 2015-06-30 MED ORDER — METOPROLOL SUCCINATE ER 25 MG PO TB24: 75.0000 mg | ORAL_TABLET | Freq: Every day | ORAL | Status: DC

## 2015-06-30 NOTE — Patient Instructions (Signed)
Your physician has recommended you make the following change in your medication: increase Metoprolol to 75 mg daily. A new Rx has been sent to your pharmacy.  Your physician has recommended you make the following change in your medication: at your convenience at [facility]  Remote monitoring is used to monitor your Pacemaker from home. This monitoring reduces the number of office visits required to check your device to one time per year. It allows [place] to monitor the functioning of your device to ensure it is working properly. You are scheduled for a device check from home on Sep 28, 2015. You may send your transmission at any time that day. If you have a wireless device, the transmission will be sent automatically. After your physician reviews your transmission, you will receive a postcard with your next transmission date.  Dr. Royann Shivers recommends that you schedule a follow-up appointment in: one year with pacemaker check (Medtronic - Blue).

## 2015-06-30 NOTE — Progress Notes (Signed)
Patient ID: Diamond Miller, female   DOB: Nov 04, 1932, 82 y.o.   MRN: [id number]    Cardiology Office Note    Date:  06/30/2015   ID:  Diamond Miller, DOB Jan 25, 1933, MRN [id number]  PCP:  Lorie Phenix, MD  Cardiologist:   Thurmon Fair, MD   Chief Complaint  Patient presents with  . Follow-up     having chest pain, no shortness of breath, has edema, has pain in legs, has lightheadedness or dizziness    History of Present Illness:  Diamond Miller is a 82 y.o. female with a history of tachycardia-bradycardia syndrome (sinus bradycardia, paroxysmal atrial fibrillation and atrial flutter) for which received a dual-chamber permanent pacemaker in 2015. Despite high embolic risk (presumed embolic stroke in December 2014) she is not receiving anticoagulation since she has had numerous falls with serious injury and has biliary cirrhosis. Thankfully, she has not had any recent bleeding problems and denies any new neurological complaints.  Pacemaker interrogation shows normal device function. Her Medtronic Advisa dual-chamber pacemaker was implanted in 2015 and has roughly 8 years of remaining longevity. She has 38% atrial pacing and only 0.9% ventricular pacing. Since her last device check there has been an increase in the burden of atrial tachyarrhythmia, especially atrial tachycardia and atrial flutter. The longest episode was about 24 hours in duration. The most common abnormality seems to be atrial flutter with 2-1 AV block and the resulting heart rate of around the 110 bpm. At other times she has a slower atrial tachycardia again conducting with 2-1 AV block.  She has a lot of problems with acid reflux and even taking twice daily esomeprazole does not help. She was doing much better when she took brand name Nexium, she reports. She wants me to prescribe her more diazepam, tell me that she took diazepam 3 times daily for years in the past.  Past Medical History  Diagnosis Date  .  Scoliosis   . Liver cirrhosis (HCC)   . Hypertension   . Acid reflux   . High cholesterol   . Dysrhythmia     "skips" (06/12/2013)  . COPD (chronic obstructive pulmonary disease) (HCC)   . Exertional shortness of breath     "recently" (06/12/2013)  . Anemia 1957  . History of blood transfusion     "think it was when I had knee OR" (06/12/2013)  . Stroke (HCC) 05/14/2013    denies residual on 06/12/2013  . Degenerative disc disease, cervical   . Chronic back pain   . Anxiety   . Pacemaker, Medtronic placed 06/01/13 06/11/2013    Past Surgical History  Procedure Laterality Date  . Replacement total knee Right 2010  . Cataract extraction w/ intraocular lens  implant, bilateral Bilateral 2006-2007  . Tee without cardioversion N/A 05/17/2013    Procedure: TRANSESOPHAGEAL ECHOCARDIOGRAM (TEE);  Surgeon: Vesta Mixer, MD;  Location: [facility] ENDOSCOPY;  Service: Cardiovascular;  Laterality: N/A;  . Insert / replace / remove pacemaker  06/11/2013  . Tonsillectomy  ~ 1961  . Joint replacement    . Pacemaker insertion  06/01/13    Dr. Royann Shivers Medtronic device  . Permanent pacemaker insertion N/A 06/11/2013    Procedure: PERMANENT PACEMAKER INSERTION;  Surgeon: Thurmon Fair, MD;  Location: [facility] CATH LAB;  Service: Cardiovascular;  Laterality: N/A;    Outpatient Prescriptions Prior to Visit  Medication Sig Dispense Refill  . Ascorbic Acid (VITAMIN C) 1000 MG tablet Take 1,000 mg by mouth daily. Reported on 05/11/2015    .  atorvastatin (LIPITOR) 40 MG tablet TAKE 1 TABLET BY MOUTH DAILY AT 6:00 PM. 30 tablet 3  . clopidogrel (PLAVIX) 75 MG tablet TAKE 1 TABLET BY MOUTH DAILY WITH BREAKFAST. 30 tablet 3  . diazepam (VALIUM) 5 MG tablet Take 0.5 tablets (2.5 mg total) by mouth every 12 (twelve) hours as needed for anxiety. 30 tablet 5  . esomeprazole (NEXIUM) 40 MG capsule Take 40 mg by mouth 2 (two) times daily before a meal. Reported on 05/11/2015    . furosemide (LASIX) 40 MG tablet Take 1  tablet (40 mg total) by mouth daily as needed. 30 tablet 6  . glucosamine-chondroitin 500-400 MG tablet Take 1 tablet by mouth 3 (three) times daily.     . magnesium oxide (MAG-OX) 400 MG tablet Take 400 mg by mouth daily.      . Multiple Vitamin (MULITIVITAMIN WITH MINERALS) TABS Take 1 tablet by mouth daily.    Marland Kitchen oxyCODONE (OXY IR/ROXICODONE) 5 MG immediate release tablet Take 5 mg by mouth 3 (three) times daily as needed for moderate pain. For pain    . spironolactone (ALDACTONE) 25 MG tablet Take 1 tablet (25 mg total) by mouth daily with breakfast. 30 tablet 3  . traZODone (DESYREL) 150 MG tablet Take 75 mg by mouth at bedtime.     . metoprolol succinate (TOPROL-XL) 50 MG 24 hr tablet TAKE 1 TABLET (50 MG TOTAL) BY MOUTH DAILY. 30 tablet 10   No facility-administered medications prior to visit.     Allergies:   Aspirin; Tylenol; Penicillins; and Zyrtec   Social History   Social History  . Marital Status: Widowed    Spouse Name: N/A  . Number of Children: N/A  . Years of Education: N/A   Social History Main Topics  . Smoking status: Never Smoker   . Smokeless tobacco: Never Used  . Alcohol Use: No  . Drug Use: No  . Sexual Activity: No   Other Topics Concern  . None   Social History Narrative     Family History:  The patient's family history includes Dementia (age of onset: 7) in her brother; Heart disease in her mother; Hypertension in her sister.   ROS:   Please see the history of present illness.    ROS All other systems reviewed and are negative.   PHYSICAL EXAM:   VS:  BP 140/84 mmHg  Pulse 70  Ht 4\' 11"  (1.499 m)  Wt 64.184 kg (141 lb 8 oz)  BMI 28.56 kg/m2   GEN: Well nourished, well developed, in no acute distress HEENT: normal Neck: no JVD, carotid bruits, or masses Cardiac: RRR; no murmurs, rubs, or gallops,no edema , healthy subclavian pacemaker site Respiratory:  clear to auscultation bilaterally, normal work of breathing GI: soft, nontender,  nondistended, + BS MS: no deformity or atrophy Skin: warm and dry, no rash Neuro:  Alert and Oriented x 3, Strength and sensation are intact Psych: euthymic mood, full affect  Wt Readings from Last 3 Encounters:  06/30/15 64.184 kg (141 lb 8 oz)  05/11/15 64.411 kg (142 lb)  04/08/15 66.225 kg (146 lb)      Studies/Labs Reviewed:   EKG:  EKG is not ordered today  Recent Labs: 12/30/2014: Hemoglobin 12.8; Platelets 267 04/03/2015: ALT 37*; BUN 23; Creatinine, Ser 1.05; Potassium 3.6; Sodium 138   Lipid Panel    Component Value Date/Time   CHOL 168 05/15/2013 0155   TRIG 123 05/15/2013 0155   HDL 38* 05/15/2013 0155  CHOLHDL 4.4 05/15/2013 0155   VLDL 25 05/15/2013 0155   LDLCALC 105* 05/15/2013 0155     ASSESSMENT:    1. Sinus pause   2. Tachy-brady syndrome (HCC)   3. Paroxysmal atrial fibrillation, has not been felt to be anticoagulation candidate secondary to freq falls and biliary cirhosis     4. Essential hypertension   5. Pacemaker, Medtronic placed 06/11/13   6. Medication management      PLAN:  In order of problems listed above:  1. Sinus node dysfunction, asymptomatic after pacemaker implantation 2. As above 3. PAF: episodes are asymptomatic. She mostly has fairly organized atrial tachycardia and atrial flutter with 2:1 AV conduction. CHADSVasc score 6, but felt to be at very high risk for bleeding complications. On clopidogrel. Will check labs to make sure there is not an electrolyte abnormality that explains the recent increase in overall burden of her events also increase metoprolol to 75 mg daily. 4. HTN: Fair blood pressure control, should be even better with higher dose of metoprolol 5. PPM: Normal pacemaker function. Check via [facility] every 3 months and office visits yearly 6. No sign of any side effects of statin, despite diagnosis of primary biliary cirrhosis.  I told Diamond Miller that I don't think it is appropriate for me to prescribe her  benzodiazepines.  Medication Adjustments/Labs and Tests Ordered: Current medicines are reviewed at length with the patient today.  Concerns regarding medicines are outlined above.  Medication changes, Labs and Tests ordered today are listed in the Patient Instructions below. Patient Instructions  Your physician has recommended you make the following change in your medication: increase Metoprolol to 75 mg daily. A new Rx has been sent to your pharmacy.  Your physician has recommended you make the following change in your medication: at your convenience at [facility]  Remote monitoring is used to monitor your Pacemaker from home. This monitoring reduces the number of office visits required to check your device to one time per year. It allows [place] to monitor the functioning of your device to ensure it is working properly. You are scheduled for a device check from home on Sep 28, 2015. You may send your transmission at any time that day. If you have a wireless device, the transmission will be sent automatically. After your physician reviews your transmission, you will receive a postcard with your next transmission date.  Dr. Royann Shivers recommends that you schedule a follow-up appointment in: one year with pacemaker check (Medtronic - Blue).        Joie Bimler, MD  06/30/2015 3:18 PM    [facility] [address] [place], [place], [place]  [address] Phone: [phone]; Fax: [phone]

## 2015-07-01 LAB — COMPREHENSIVE METABOLIC PANEL
ALK PHOS: 346 U/L — AB (ref 33–130)
ALT: 33 U/L — AB (ref 6–29)
AST: 59 U/L — AB (ref 10–35)
Albumin: 3.5 g/dL — ABNORMAL LOW (ref 3.6–5.1)
BILIRUBIN TOTAL: 0.6 mg/dL (ref 0.2–1.2)
BUN: 15 mg/dL (ref 7–25)
CO2: 32 mmol/L — AB (ref 20–31)
CREATININE: 0.88 mg/dL (ref 0.60–0.88)
Calcium: 9 mg/dL (ref 8.6–10.4)
Chloride: 96 mmol/L — ABNORMAL LOW (ref 98–110)
GLUCOSE: 73 mg/dL (ref 65–99)
Potassium: 4.2 mmol/L (ref 3.5–5.3)
SODIUM: 138 mmol/L (ref 135–146)
Total Protein: 6.9 g/dL (ref 6.1–8.1)

## 2015-07-03 DIAGNOSIS — Z0100 Encounter for examination of eyes and vision without abnormal findings: Secondary | ICD-10-CM | POA: Diagnosis not present

## 2015-07-07 ENCOUNTER — Other Ambulatory Visit: Payer: Self-pay | Admitting: Cardiovascular Disease

## 2015-07-07 NOTE — Telephone Encounter (Signed)
Rx(s) sent to pharmacy electronically.  

## 2015-07-23 ENCOUNTER — Other Ambulatory Visit: Payer: Self-pay | Admitting: Family Medicine

## 2015-07-23 DIAGNOSIS — K219 Gastro-esophageal reflux disease without esophagitis: Secondary | ICD-10-CM

## 2015-07-23 MED ORDER — ESOMEPRAZOLE MAGNESIUM 40 MG PO CPDR: 40.0000 mg | DELAYED_RELEASE_CAPSULE | Freq: Two times a day (BID) | ORAL | Status: DC

## 2015-07-23 NOTE — Telephone Encounter (Signed)
Pt contacted office for refill request on the following medications:  (NEXIUM) 40 MG capsule to [facility] in [place]. Pt stated that she would like it sent for the name brand not the generic b/c she doesn't feel it works the same. Pt stated that her insurance might not cover it and she would like [place] to do a PA if it doesn't cover. Thanks TNP

## 2015-07-24 ENCOUNTER — Telehealth: Payer: Self-pay | Admitting: Family Medicine

## 2015-07-24 NOTE — Telephone Encounter (Signed)
Please advise? Should I call the pharmacy and see which med was filled? Not sure how to go about this. Thanks!

## 2015-07-24 NOTE — Telephone Encounter (Signed)
Pt called saying the generic Nexium does not work for her.  She called yesterday for a letter to insurance for her to have the name brand.  She said she recd. The medication today and it was still the generic.  She cant use the generic.?  Her call back is [phone]  Thanks Barth Kirkseri

## 2015-07-25 NOTE — Telephone Encounter (Signed)
Please see why patient can no use generic. Also see if is other med on formulary that she can try. Thanks.

## 2015-07-27 NOTE — Telephone Encounter (Signed)
OK to call for PA.Thanks.

## 2015-07-27 NOTE — Telephone Encounter (Signed)
Pt states the generic is not as effective as name brand. Does not know of any other meds on formulary for this problem. Allene DillonEmily Drozdowski, CMA

## 2015-07-28 NOTE — Telephone Encounter (Signed)
Per pt's pharmacy she will need to try the generic Nexium and have a "Doucumented Fail" before her insurance will do a PA for Branded Nexium.  Pt advised.   Thanks,   -Vernona RiegerLaura

## 2015-08-10 ENCOUNTER — Other Ambulatory Visit: Payer: Self-pay | Admitting: Family Medicine

## 2015-08-10 DIAGNOSIS — G4700 Insomnia, unspecified: Secondary | ICD-10-CM

## 2015-08-21 ENCOUNTER — Telehealth: Payer: Self-pay | Admitting: Family Medicine

## 2015-08-21 NOTE — Telephone Encounter (Signed)
Patient called office wanting to know if Dr. Elease HashimotoMaloney can switch her Roxicodone prescription. Patient reports when she takes medication she "feels drunk" when taking medication, patient has been advised that provider is out of office until Monday.

## 2015-08-21 NOTE — Telephone Encounter (Signed)
Should patient come in for OV to discuss? Please advise. Thanks!

## 2015-08-21 NOTE — Telephone Encounter (Signed)
Needs ov. Thanks.  

## 2015-08-24 ENCOUNTER — Other Ambulatory Visit: Payer: Self-pay

## 2015-08-24 ENCOUNTER — Telehealth: Payer: Self-pay | Admitting: Family Medicine

## 2015-08-24 NOTE — Telephone Encounter (Signed)
Left message asking pt to schedule OV. Allene DillonEmily Drozdowski, CMA

## 2015-08-24 NOTE — Telephone Encounter (Signed)
FYI- Pt states after you couldn't fill the prescription in Friday, she called pain management, Dr. Mikle Bosworthaymos in [place], who changed pt's prescription to Morphine. Allene DillonEmily Drozdowski, CMA

## 2015-08-28 DIAGNOSIS — M546 Pain in thoracic spine: Secondary | ICD-10-CM | POA: Diagnosis not present

## 2015-08-28 DIAGNOSIS — Z79891 Long term (current) use of opiate analgesic: Secondary | ICD-10-CM | POA: Diagnosis not present

## 2015-08-28 DIAGNOSIS — M5136 Other intervertebral disc degeneration, lumbar region: Secondary | ICD-10-CM | POA: Diagnosis not present

## 2015-08-28 DIAGNOSIS — G894 Chronic pain syndrome: Secondary | ICD-10-CM | POA: Diagnosis not present

## 2015-09-25 ENCOUNTER — Telehealth: Payer: Self-pay | Admitting: Family Medicine

## 2015-09-25 DIAGNOSIS — M546 Pain in thoracic spine: Secondary | ICD-10-CM | POA: Diagnosis not present

## 2015-09-25 DIAGNOSIS — G894 Chronic pain syndrome: Secondary | ICD-10-CM | POA: Diagnosis not present

## 2015-09-25 DIAGNOSIS — M5136 Other intervertebral disc degeneration, lumbar region: Secondary | ICD-10-CM | POA: Diagnosis not present

## 2015-09-25 DIAGNOSIS — M1990 Unspecified osteoarthritis, unspecified site: Secondary | ICD-10-CM

## 2015-09-25 DIAGNOSIS — Z79891 Long term (current) use of opiate analgesic: Secondary | ICD-10-CM | POA: Diagnosis not present

## 2015-09-25 NOTE — Telephone Encounter (Signed)
Needs new referral to Dr. Ethelene Halamos.  Thanks.

## 2015-09-28 ENCOUNTER — Ambulatory Visit (INDEPENDENT_AMBULATORY_CARE_PROVIDER_SITE_OTHER): Payer: Commercial Managed Care - HMO | Admitting: Family Medicine

## 2015-09-28 ENCOUNTER — Ambulatory Visit (INDEPENDENT_AMBULATORY_CARE_PROVIDER_SITE_OTHER): Payer: Commercial Managed Care - HMO | Admitting: *Deleted

## 2015-09-28 ENCOUNTER — Encounter: Payer: Self-pay | Admitting: Family Medicine

## 2015-09-28 VITALS — BP 118/64 | HR 68 | Temp 97.3°F | Resp 16 | Wt 148.0 lb

## 2015-09-28 DIAGNOSIS — K743 Primary biliary cirrhosis: Secondary | ICD-10-CM

## 2015-09-28 DIAGNOSIS — I495 Sick sinus syndrome: Secondary | ICD-10-CM | POA: Diagnosis not present

## 2015-09-28 DIAGNOSIS — I480 Paroxysmal atrial fibrillation: Secondary | ICD-10-CM | POA: Diagnosis not present

## 2015-09-28 DIAGNOSIS — K219 Gastro-esophageal reflux disease without esophagitis: Secondary | ICD-10-CM

## 2015-09-28 MED ORDER — OMEPRAZOLE 40 MG PO CPDR: 40.0000 mg | DELAYED_RELEASE_CAPSULE | Freq: Every day | ORAL | Status: AC

## 2015-09-28 NOTE — Progress Notes (Signed)
Patient ID: [name], female   DOB: [birth year], 82 y.o.   MRN: [id number]        Patient: Diamond Miller Female    DOB: [birth year]   82 y.o.   MRN: [id number] Visit Date: 09/28/2015  Today's Provider: Lorie Phenix, MD   Chief Complaint  Patient presents with  . Cirrhosis   Subjective:    Gastroesophageal Reflux She reports no abdominal pain, no chest pain, no coughing, no nausea, no sore throat or no wheezing. This is a chronic problem. The problem occurs constantly. The problem has been gradually worsening. She has tried a PPI for the symptoms. The treatment provided no relief (Pt reports she has tried Protonix and generic Nexium with no relief.  ).    Also concerned about cord-like lesions above area where pace maker was implanted.   Not really tender. Have not been there the whole time. Have developed in last year or so.        Allergies  Allergen Reactions  . Aspirin Other (See Comments)    Liver function  . Tylenol [Acetaminophen] Other (See Comments)    Liver function   . Penicillins Rash  . Zyrtec [Cetirizine] Rash   Previous Medications   ASCORBIC ACID (VITAMIN C) 1000 MG TABLET    Take 1,000 mg by mouth daily. Reported on 05/11/2015   ATORVASTATIN (LIPITOR) 40 MG TABLET    TAKE 1 TABLET BY MOUTH DAILY AT 6:00 PM.   CETIRIZINE (ZYRTEC) 10 MG TABLET    Take 10 mg by mouth daily.   CLOPIDOGREL (PLAVIX) 75 MG TABLET    TAKE 1 TABLET BY MOUTH DAILY WITH BREAKFAST.   DIAZEPAM (VALIUM) 5 MG TABLET    Take 0.5 tablets (2.5 mg total) by mouth every 12 (twelve) hours as needed for anxiety.   ESOMEPRAZOLE (NEXIUM) 40 MG CAPSULE    Take 1 capsule (40 mg total) by mouth 2 (two) times daily before a meal.   FUROSEMIDE (LASIX) 40 MG TABLET    TAKE 1 TABLET (40 MG TOTAL) BY MOUTH DAILY AS NEEDED.   GLUCOSAMINE-CHONDROITIN 500-400 MG TABLET    Take 1 tablet by mouth 3 (three) times daily.    MAGNESIUM OXIDE (MAG-OX) 400 MG TABLET    Take 400 mg by mouth daily.     METOPROLOL SUCCINATE (TOPROL-XL) 25 MG 24 HR TABLET    Take 3 tablets (75 mg total) by mouth daily. Take with or immediately following a meal.   MORPHINE (MSIR) 15 MG TABLET    Reported on 09/28/2015   MULTIPLE VITAMIN (MULITIVITAMIN WITH MINERALS) TABS    Take 1 tablet by mouth daily.   OXYCODONE (OXY IR/ROXICODONE) 5 MG IMMEDIATE RELEASE TABLET    TK 1 T PO Q 6 H PRN P   SPIRONOLACTONE (ALDACTONE) 25 MG TABLET    Take 1 tablet (25 mg total) by mouth daily with breakfast.   TRAZODONE (DESYREL) 150 MG TABLET    TAKE 1/2 TABLET BY MOUTH DAILY.    Review of Systems  Constitutional: Negative.   HENT: Negative for sore throat.   Respiratory: Negative.  Negative for cough and wheezing.   Cardiovascular: Negative.  Negative for chest pain.  Gastrointestinal: Negative.  Negative for nausea and abdominal pain.  Musculoskeletal: Positive for back pain, arthralgias and gait problem. Negative for myalgias, joint swelling, neck pain and neck stiffness.  Neurological: Negative for dizziness, light-headedness and headaches.    Social History  Substance Use Topics  . Smoking status: Never  Smoker   . Smokeless tobacco: Never Used  . Alcohol Use: No   Objective:   BP 118/64 mmHg  Pulse 68  Temp(Src) 97.3 F (36.3 C) (Oral)  Resp 16  Wt 148 lb (67.132 kg)  Physical Exam  Constitutional: She is oriented to person, place, and time. She appears well-developed and well-nourished.  Cardiovascular: Normal rate and regular rhythm.   Pulmonary/Chest: Effort normal and breath sounds normal.  Neurological: She is alert and oriented to person, place, and time.  Skin:  Does have palpable cord like lesion in upper chest wall.  Pacemaker palpable below. Scar well healed.    Psychiatric: She has a normal mood and affect. Her behavior is normal. Judgment and thought content normal.       Assessment & Plan:     1. Cirrhosis, primary biliary [facility](HCC) Referral to GI.  - Ambulatory referral to  Gastroenterology  2. Gastroesophageal reflux disease without esophagitis Still not to goal.  Pt reports failing Protonix and Generic Nexium.  She says brand named Nexium worked will for her but her insurance will not cover it.  Pt would like to try Omeprazole capsules; she said it worked well for her niece.  RX sent to her pharmacy.   - omeprazole (PRILOSEC) 40 MG capsule; Take 1 capsule (40 mg total) by mouth daily.  Dispense: 90 capsule; Refill: 1 - Ambulatory referral to Gastroenterology  3. Tachy-brady syndrome [facility](HCC) Referral to Cardiology. Is going to call them about lesions about pacemaker. If they want further evaluation,  Separate from insertion of pacemaker, will order ultrasound.    - Ambulatory referral to Cardiology  Patient was seen and examined by Leo GrosserNancy J. Namiah Dunnavant, MD, and note scribed by Kavin LeechLaura Walsh, CMA.   I have reviewed the document for accuracy and completeness and I agree with above. - Leo GrosserNancy J. Everlyn Farabaugh, MD      Diamond PhenixNancy Mabeline Varas, MD  [facility]

## 2015-09-29 ENCOUNTER — Telehealth: Payer: Self-pay | Admitting: Family Medicine

## 2015-09-29 NOTE — Telephone Encounter (Signed)
Pt is requesting that her Nexium be changed to something different.This can be sent to [facility]

## 2015-09-30 NOTE — Progress Notes (Signed)
Remote pacemaker transmission.   

## 2015-10-02 NOTE — Telephone Encounter (Signed)
Already switched to Omeprazole.  Thanks.

## 2015-10-05 ENCOUNTER — Telehealth: Payer: Self-pay | Admitting: Family Medicine

## 2015-10-05 NOTE — Telephone Encounter (Signed)
Pt wanted to let Dr. Elease HashimotoMaloney know that the office she was referred to for PCP isn't accepting new pt's. Pt stated that her friend got her [facility]. Pt wanted her records sent to that office. I advised that she would need to sign a release form. Thanks TNP

## 2015-10-06 ENCOUNTER — Telehealth: Payer: Self-pay

## 2015-10-06 NOTE — Telephone Encounter (Signed)
Prior authorization for Metoprolol Succinate sent to patient's insurance company via [email or web address].  Prior authorization for ToprolXL has been approved. Pharmacy notified via fax.

## 2015-10-10 LAB — CUP PACEART INCLINIC DEVICE CHECK
Battery Voltage: 3.02 V
Brady Statistic AP VS Percent: 37.63 %
Brady Statistic AS VP Percent: 0.86 %
Brady Statistic AS VS Percent: 61.47 %
Date Time Interrogation Session: 20170207182219
Implantable Lead Implant Date: 20150120
Implantable Lead Implant Date: 20150120
Implantable Lead Location: 753859
Implantable Lead Model: 5076
Implantable Lead Model: 5076
Lead Channel Impedance Value: 437 Ohm
Lead Channel Impedance Value: 551 Ohm
Lead Channel Pacing Threshold Amplitude: 0.875 V
Lead Channel Pacing Threshold Pulse Width: 0.4 ms
Lead Channel Sensing Intrinsic Amplitude: 15 mV
Lead Channel Sensing Intrinsic Amplitude: 15 mV
Lead Channel Sensing Intrinsic Amplitude: 2.875 mV
Lead Channel Setting Pacing Pulse Width: 0.4 ms
MDC IDC LEAD LOCATION: 753860
MDC IDC MSMT BATTERY REMAINING LONGEVITY: 100 mo
MDC IDC MSMT LEADCHNL RA PACING THRESHOLD AMPLITUDE: 0.5 V
MDC IDC MSMT LEADCHNL RA PACING THRESHOLD PULSEWIDTH: 0.4 ms
MDC IDC MSMT LEADCHNL RA SENSING INTR AMPL: 2.875 mV
MDC IDC MSMT LEADCHNL RV IMPEDANCE VALUE: 551 Ohm
MDC IDC MSMT LEADCHNL RV IMPEDANCE VALUE: 589 Ohm
MDC IDC SET LEADCHNL RA PACING AMPLITUDE: 1.5 V
MDC IDC SET LEADCHNL RV PACING AMPLITUDE: 2 V
MDC IDC SET LEADCHNL RV SENSING SENSITIVITY: 2.8 mV
MDC IDC STAT BRADY AP VP PERCENT: 0.04 %
MDC IDC STAT BRADY RA PERCENT PACED: 37.67 %
MDC IDC STAT BRADY RV PERCENT PACED: 0.9 %

## 2015-10-23 ENCOUNTER — Other Ambulatory Visit: Payer: Self-pay | Admitting: Family Medicine

## 2015-10-23 DIAGNOSIS — J309 Allergic rhinitis, unspecified: Secondary | ICD-10-CM

## 2015-10-23 DIAGNOSIS — F4322 Adjustment disorder with anxiety: Secondary | ICD-10-CM

## 2015-10-23 MED ORDER — DIAZEPAM 5 MG PO TABS: 2.5000 mg | ORAL_TABLET | Freq: Two times a day (BID) | ORAL | Status: AC | PRN

## 2015-10-23 MED ORDER — CETIRIZINE HCL 10 MG PO TABS: 10.0000 mg | ORAL_TABLET | Freq: Every day | ORAL | Status: DC

## 2015-10-23 NOTE — Telephone Encounter (Signed)
Printed, please fax or call in to pharmacy. Thank you.   

## 2015-10-23 NOTE — Telephone Encounter (Signed)
Pt contacted office for refill request on the following medications:  [facility]  CB#[phone]/MW  diazepam (VALIUM) 5 MG tablet  cetirizine (ZYRTEC) 10 MG tablet

## 2015-11-04 ENCOUNTER — Encounter: Payer: Self-pay | Admitting: Cardiology

## 2015-11-05 LAB — CUP PACEART REMOTE DEVICE CHECK
Battery Voltage: 3.02 V
Brady Statistic AP VP Percent: 0.04 %
Brady Statistic AS VP Percent: 1.09 %
Brady Statistic RA Percent Paced: 47.59 %
Brady Statistic RV Percent Paced: 1.13 %
Date Time Interrogation Session: 20170508202048
Implantable Lead Implant Date: 20150120
Implantable Lead Location: 753860
Implantable Lead Model: 5076
Implantable Lead Model: 5076
Lead Channel Impedance Value: 399 Ohm
Lead Channel Impedance Value: 532 Ohm
Lead Channel Impedance Value: 608 Ohm
Lead Channel Pacing Threshold Amplitude: 0.625 V
Lead Channel Pacing Threshold Amplitude: 0.75 V
Lead Channel Pacing Threshold Pulse Width: 0.4 ms
Lead Channel Pacing Threshold Pulse Width: 0.4 ms
Lead Channel Sensing Intrinsic Amplitude: 17 mV
Lead Channel Sensing Intrinsic Amplitude: 2.125 mV
Lead Channel Setting Pacing Pulse Width: 0.4 ms
Lead Channel Setting Sensing Sensitivity: 2.8 mV
MDC IDC LEAD IMPLANT DT: 20150120
MDC IDC LEAD LOCATION: 753859
MDC IDC MSMT BATTERY REMAINING LONGEVITY: 99 mo
MDC IDC MSMT LEADCHNL RA IMPEDANCE VALUE: 532 Ohm
MDC IDC MSMT LEADCHNL RA SENSING INTR AMPL: 2.125 mV
MDC IDC MSMT LEADCHNL RV SENSING INTR AMPL: 17 mV
MDC IDC SET LEADCHNL RA PACING AMPLITUDE: 1.5 V
MDC IDC SET LEADCHNL RV PACING AMPLITUDE: 2 V
MDC IDC STAT BRADY AP VS PERCENT: 47.55 %
MDC IDC STAT BRADY AS VS PERCENT: 51.32 %

## 2015-11-13 ENCOUNTER — Telehealth: Payer: Self-pay | Admitting: Family Medicine

## 2015-11-13 NOTE — Telephone Encounter (Signed)
Pt called and would like a nurse to return her call. When I asked pt what this was regarding she stated it was personal. Thanks TNP

## 2015-11-20 DIAGNOSIS — M5136 Other intervertebral disc degeneration, lumbar region: Secondary | ICD-10-CM | POA: Diagnosis not present

## 2015-11-20 DIAGNOSIS — Z79891 Long term (current) use of opiate analgesic: Secondary | ICD-10-CM | POA: Diagnosis not present

## 2015-11-20 DIAGNOSIS — M546 Pain in thoracic spine: Secondary | ICD-10-CM | POA: Diagnosis not present

## 2015-11-20 DIAGNOSIS — G894 Chronic pain syndrome: Secondary | ICD-10-CM | POA: Diagnosis not present

## 2015-11-26 ENCOUNTER — Encounter: Payer: Self-pay | Admitting: Cardiovascular Disease

## 2015-11-26 ENCOUNTER — Ambulatory Visit (INDEPENDENT_AMBULATORY_CARE_PROVIDER_SITE_OTHER): Payer: Commercial Managed Care - HMO | Admitting: Cardiovascular Disease

## 2015-11-26 VITALS — BP 121/76 | HR 69 | Ht 59.0 in | Wt 148.0 lb

## 2015-11-26 DIAGNOSIS — Z950 Presence of cardiac pacemaker: Secondary | ICD-10-CM | POA: Diagnosis not present

## 2015-11-26 DIAGNOSIS — I480 Paroxysmal atrial fibrillation: Secondary | ICD-10-CM

## 2015-11-26 DIAGNOSIS — I10 Essential (primary) hypertension: Secondary | ICD-10-CM | POA: Diagnosis not present

## 2015-11-26 DIAGNOSIS — I455 Other specified heart block: Secondary | ICD-10-CM

## 2015-11-26 DIAGNOSIS — E785 Hyperlipidemia, unspecified: Secondary | ICD-10-CM

## 2015-11-26 NOTE — Patient Instructions (Signed)
Dr Royann Shiversroitoru recommends that you continue on your current medications as directed. Please refer to the Current Medication list given to you today.  Remote monitoring is used to monitor your Pacemaker of ICD from home. This monitoring reduces the number of office visits required to check your device to one time per year. It allows us to keep an eye on the functioning of your device to ensure it is working properly. You are scheduled for a device check from home on Thursday, October 5th, 2017. You may send your transmission at any time that day. If you have a wireless device, the transmission will be sent automatically. After your physician reviews your transmission, you will receive a postcard with your next transmission date.  Dr Royann Shiversroitoru recommends that you schedule a follow-up appointment in 6 months with a pacemaker check. You will receive a reminder letter in the mail two months in advance. If you don't receive a letter, please call our office to schedule the follow-up appointment.  If you need a refill on your cardiac medications before your next appointment, please call your pharmacy.

## 2015-11-26 NOTE — Progress Notes (Signed)
Patient ID: Diamond Miller, female   DOB: 1932/10/14, 82 y.o.   MRN: [id number]    Cardiology Office Note    Date:  11/26/2015   ID:  Diamond RuddleClara L Miller, DOB 1932/10/14, MRN [id number]  PCP:  Lorie PhenixNancy Maloney, MD  Cardiologist:   Thurmon FairMihai Zafar Debrosse, MD   No chief complaint on file.   History of Present Illness:  Diamond Miller is a 82 y.o. female with a history of tachycardia-bradycardia syndrome (sinus bradycardia, paroxysmal atrial fibrillation and atrial flutter) for which received a dual-chamber permanent pacemaker in 2015. Despite high embolic risk (presumed embolic stroke in December 2014) she is not receiving anticoagulation since she has had numerous falls with serious injury and has biliary cirrhosis. Thankfully, she has not had any recent bleeding problems and denies any new neurological complaints.  Pacemaker interrogation shows normal device function. Her Medtronic Advisa dual-chamber pacemaker was implanted in 2015 and has roughly 8 years of remaining longevity. She has 52% atrial pacing and only 1% ventricular pacing. Since her last device check there has been 5.3% Atrial mode switch, both atrial flutter and atrial fibrillation . Ventricular rate control is satisfactory, although not perfect.  She does not appear to be aware of palpitations. She denies syncope, focal neurological events or bleeding problems. She continues to be very unsteady on her feet although she has not had serious falls. She takes furosemide every day and occasionally takes an extra dose for worsening swelling.   She mostly wants to talk about her poor tolerance to immediate release oxycodone, which she had to take when the sustained release capsules were not available. Once she switched back to the capsule she felt better. However during the time that she was taking oxycodone she had a lot of issues with disorientation and unsteadiness. She is therefore not driving anymore. She did better when she was taking  physical therapy, but now feels unsteady again.  Lab tests performed in February showed elevated alkaline phosphatase and borderline abnormal transaminases, normal total bilirubin  Past Medical History  Diagnosis Date  . Scoliosis   . Liver cirrhosis (HCC)   . Hypertension   . Acid reflux   . High cholesterol   . Dysrhythmia     "skips" (06/12/2013)  . COPD (chronic obstructive pulmonary disease) (HCC)   . Exertional shortness of breath     "recently" (06/12/2013)  . Anemia 1957  . History of blood transfusion     "think it was when I had knee OR" (06/12/2013)  . Stroke (HCC) 05/14/2013    denies residual on 06/12/2013  . Degenerative disc disease, cervical   . Chronic back pain   . Anxiety   . Pacemaker, Medtronic placed 06/01/13 06/11/2013    Past Surgical History  Procedure Laterality Date  . Replacement total knee Right 2010  . Cataract extraction w/ intraocular lens  implant, bilateral Bilateral 2006-2007  . Tee without cardioversion N/A 05/17/2013    Procedure: TRANSESOPHAGEAL ECHOCARDIOGRAM (TEE);  Surgeon: Vesta MixerPhilip J Nahser, MD;  [facility];  Service: Cardiovascular;  Laterality: N/A;  . Insert / replace / remove pacemaker  06/11/2013  . Tonsillectomy  ~ 1961  . Joint replacement    . Pacemaker insertion  06/01/13    Dr. Royann Shiversroitoru/ Medtronic device  . Permanent pacemaker insertion N/A 06/11/2013    Procedure: PERMANENT PACEMAKER INSERTION;  Surgeon: Thurmon FairMihai Joshua Soulier, MD;  Location: [facility] CATH LAB;  Service: Cardiovascular;  Laterality: N/A;    Outpatient Prescriptions Prior to Visit  Medication Sig  Dispense Refill  . Ascorbic Acid (VITAMIN C) 1000 MG tablet Take 1,000 mg by mouth daily. Reported on 05/11/2015    . atorvastatin (LIPITOR) 40 MG tablet TAKE 1 TABLET BY MOUTH DAILY AT 6:00 PM. 30 tablet 3  . cetirizine (ZYRTEC) 10 MG tablet Take 1 tablet (10 mg total) by mouth daily. 90 tablet 1  . clopidogrel (PLAVIX) 75 MG tablet TAKE 1 TABLET BY MOUTH DAILY WITH  BREAKFAST. 30 tablet 3  . diazepam (VALIUM) 5 MG tablet Take 0.5 tablets (2.5 mg total) by mouth every 12 (twelve) hours as needed for anxiety. 30 tablet 5  . furosemide (LASIX) 40 MG tablet TAKE 1 TABLET (40 MG TOTAL) BY MOUTH DAILY AS NEEDED. 90 tablet 1  . glucosamine-chondroitin 500-400 MG tablet Take 1 tablet by mouth 3 (three) times daily.     . magnesium oxide (MAG-OX) 400 MG tablet Take 400 mg by mouth daily.      . metoprolol succinate (TOPROL-XL) 25 MG 24 hr tablet Take 3 tablets (75 mg total) by mouth daily. Take with or immediately following a meal. 90 tablet 11  . Multiple Vitamin (MULITIVITAMIN WITH MINERALS) TABS Take 1 tablet by mouth daily.    Marland Kitchen. omeprazole (PRILOSEC) 40 MG capsule Take 1 capsule (40 mg total) by mouth daily. 90 capsule 1  . oxyCODONE (OXY IR/ROXICODONE) 5 MG immediate release tablet TK 1 T PO Q 6 H PRN P  0  . spironolactone (ALDACTONE) 25 MG tablet Take 1 tablet (25 mg total) by mouth daily with breakfast. 30 tablet 3  . traZODone (DESYREL) 150 MG tablet TAKE 1/2 TABLET BY MOUTH DAILY. 30 tablet 11  . morphine (MSIR) 15 MG tablet Reported on 11/26/2015  0   No facility-administered medications prior to visit.     Allergies:   Aspirin; Tylenol; Penicillins; and Zyrtec   Social History   Social History  . Marital Status: Widowed    Spouse Name: N/A  . Number of Children: N/A  . Years of Education: N/A   Social History Main Topics  . Smoking status: Never Smoker   . Smokeless tobacco: Never Used  . Alcohol Use: No  . Drug Use: No  . Sexual Activity: No   Other Topics Concern  . Not on file   Social History Narrative     Family History:  The patient's family history includes Dementia (age of onset: 5360) in her brother; Heart disease in her mother; Hypertension in her sister.   ROS:   Please see the history of present illness.    ROS All other systems reviewed and are negative.   PHYSICAL EXAM:   VS:  BP 121/76 mmHg  Pulse 69  Ht 4\' 11"   (1.499 m)  Wt 67.132 kg (148 lb)  BMI 29.88 kg/m2   GEN: Well nourished, well developed, in no acute distress HEENT: normal Neck: no JVD, carotid bruits, or masses Cardiac: RRR; no murmurs, rubs, or gallops,no edema , healthy subclavian pacemaker site Respiratory:  clear to auscultation bilaterally, normal work of breathing GI: soft, nontender, nondistended, + BS MS: no deformity or atrophy Skin: warm and dry, no rash Neuro:  Alert and Oriented x 3, Strength and sensation are intact Psych: euthymic mood, full affect  Wt Readings from Last 3 Encounters:  11/26/15 67.132 kg (148 lb)  09/28/15 67.132 kg (148 lb)  06/30/15 64.184 kg (141 lb 8 oz)      Studies/Labs Reviewed:   EKG:  EKG is ordered todayand shows normal  sinus rhythm, QTC 415 ms   Recent Labs: 12/30/2014: Hemoglobin 12.8; Platelets 267 06/30/2015: ALT 33*; BUN 15; Creat 0.88; Potassium 4.2; Sodium 138   Lipid Panel    Component Value Date/Time   CHOL 168 05/15/2013 0155   TRIG 123 05/15/2013 0155   HDL 38* 05/15/2013 0155   CHOLHDL 4.4 05/15/2013 0155   VLDL 25 05/15/2013 0155   LDLCALC 105* 05/15/2013 0155     ASSESSMENT:    1. Sinus pause   2. Paroxysmal atrial fibrillation, has not been felt to be anticoagulation candidate secondary to freq falls and biliary cirhosis     3. Essential hypertension   4. Pacemaker, Medtronic placed 06/11/13   5. HLD (hyperlipidemia)      PLAN:  In order of problems listed above:  1. Sinus node dysfunction, asymptomatic after pacemaker implantation 2. PAF: episodes are asymptomatic. CHADSVasc score 6, but felt to be at very high risk for bleeding complications. Taking clopidogrel. Rate control is satisfactory after we increased metoprolol to 75 mg daily. 3. HTN: Good blood pressure control on higher dose of metoprolol 4. PPM: Normal pacemaker function. Check via [facility] every 3 months and office visits twice yearly 5. No sign of any side effects of statin, despite  diagnosis of primary biliary cirrhosis.  I told Mrs. Melder that I don't think it is appropriate for me to prescribe her benzodiazepines or any other sleep aids.  I do think should benefit from more physical therapy.   Medication Adjustments/Labs and Tests Ordered: Current medicines are reviewed at length with the patient today.  Concerns regarding medicines are outlined above.  Medication changes, Labs and Tests ordered today are listed in the Patient Instructions below. Patient Instructions  Dr Royann Shivers recommends that you continue on your current medications as directed. Please refer to the Current Medication list given to you today.  Remote monitoring is used to monitor your Pacemaker of ICD from home. This monitoring reduces the number of office visits required to check your device to one time per year. It allows [place] to keep an eye on the functioning of your device to ensure it is working properly. You are scheduled for a device check from home on Thursday, October 5th, 2017. You may send your transmission at any time that day. If you have a wireless device, the transmission will be sent automatically. After your physician reviews your transmission, you will receive a postcard with your next transmission date.  Dr Royann Shivers recommends that you schedule a follow-up appointment in 6 months with a pacemaker check. You will receive a reminder letter in the mail two months in advance. If you don't receive a letter, please call our office to schedule the follow-up appointment.  If you need a refill on your cardiac medications before your next appointment, please call your pharmacy.    Signed, Thurmon Fair, MD  11/26/2015 6:37 PM    [facility] [address] [place], [place], [place]  [address] Phone: [phone]; Fax: [phone]

## 2015-12-08 LAB — CUP PACEART INCLINIC DEVICE CHECK
Battery Remaining Longevity: 98 mo
Battery Voltage: 3.02 V
Brady Statistic AP VP Percent: 0.04 %
Brady Statistic AP VS Percent: 51.67 %
Brady Statistic AS VS Percent: 47.26 %
Date Time Interrogation Session: 20170706181819
Implantable Lead Location: 753860
Lead Channel Impedance Value: 418 Ohm
Lead Channel Impedance Value: 532 Ohm
Lead Channel Impedance Value: 589 Ohm
Lead Channel Pacing Threshold Amplitude: 0.375 V
Lead Channel Pacing Threshold Amplitude: 0.75 V
Lead Channel Pacing Threshold Pulse Width: 0.4 ms
Lead Channel Sensing Intrinsic Amplitude: 16.75 mV
Lead Channel Setting Pacing Amplitude: 1.5 V
Lead Channel Setting Pacing Pulse Width: 0.4 ms
Lead Channel Setting Sensing Sensitivity: 2.8 mV
MDC IDC LEAD IMPLANT DT: 20150120
MDC IDC LEAD IMPLANT DT: 20150120
MDC IDC LEAD LOCATION: 753859
MDC IDC MSMT LEADCHNL RA IMPEDANCE VALUE: 475 Ohm
MDC IDC MSMT LEADCHNL RA SENSING INTR AMPL: 2.625 mV
MDC IDC MSMT LEADCHNL RA SENSING INTR AMPL: 2.625 mV
MDC IDC MSMT LEADCHNL RV PACING THRESHOLD PULSEWIDTH: 0.4 ms
MDC IDC MSMT LEADCHNL RV SENSING INTR AMPL: 16.75 mV
MDC IDC SET LEADCHNL RV PACING AMPLITUDE: 2 V
MDC IDC STAT BRADY AS VP PERCENT: 1.03 %
MDC IDC STAT BRADY RA PERCENT PACED: 51.71 %
MDC IDC STAT BRADY RV PERCENT PACED: 1.07 %

## 2015-12-14 ENCOUNTER — Ambulatory Visit: Payer: Medicaid Other | Admitting: Nurse Practitioner

## 2015-12-23 ENCOUNTER — Other Ambulatory Visit: Payer: Self-pay | Admitting: Cardiovascular Disease

## 2015-12-23 NOTE — Telephone Encounter (Signed)
Rx(s) sent to pharmacy electronically.  

## 2015-12-30 ENCOUNTER — Ambulatory Visit (INDEPENDENT_AMBULATORY_CARE_PROVIDER_SITE_OTHER): Payer: Commercial Managed Care - HMO | Admitting: Family Medicine

## 2015-12-30 ENCOUNTER — Ambulatory Visit (INDEPENDENT_AMBULATORY_CARE_PROVIDER_SITE_OTHER): Payer: Commercial Managed Care - HMO

## 2015-12-30 ENCOUNTER — Encounter: Payer: Self-pay | Admitting: Family Medicine

## 2015-12-30 VITALS — BP 150/86 | HR 67 | Temp 99.1°F | Resp 20 | Ht <= 58 in | Wt 151.6 lb

## 2015-12-30 DIAGNOSIS — L600 Ingrowing nail: Secondary | ICD-10-CM | POA: Diagnosis not present

## 2015-12-30 DIAGNOSIS — M7989 Other specified soft tissue disorders: Secondary | ICD-10-CM | POA: Diagnosis not present

## 2015-12-30 DIAGNOSIS — M8580 Other specified disorders of bone density and structure, unspecified site: Secondary | ICD-10-CM

## 2015-12-30 DIAGNOSIS — M79674 Pain in right toe(s): Secondary | ICD-10-CM | POA: Diagnosis not present

## 2015-12-30 DIAGNOSIS — K743 Primary biliary cirrhosis: Secondary | ICD-10-CM

## 2015-12-30 DIAGNOSIS — S20212A Contusion of left front wall of thorax, initial encounter: Secondary | ICD-10-CM | POA: Diagnosis not present

## 2015-12-30 LAB — POCT CBC
Granulocyte percent: 50.6 %G (ref 37–80)
HCT, POC: 36 % — AB (ref 37.7–47.9)
Hemoglobin: 12.7 g/dL (ref 12.2–16.2)
LYMPH, POC: 3 (ref 0.6–3.4)
MCH, POC: 31.2 pg (ref 27–31.2)
MCHC: 35.4 g/dL (ref 31.8–35.4)
MCV: 88.1 fL (ref 80–97)
MID (CBC): 0.7 (ref 0–0.9)
MPV: 6.7 fL (ref 0–99.8)
PLATELET COUNT, POC: 233 10*3/uL (ref 142–424)
POC Granulocyte: 3.8 (ref 2–6.9)
POC LYMPH PERCENT: 40.1 %L (ref 10–50)
POC MID %: 9.3 %M (ref 0–12)
RBC: 4.08 M/uL (ref 4.04–5.48)
RDW, POC: 12.7 %
WBC: 7.5 10*3/uL (ref 4.6–10.2)

## 2015-12-30 MED ORDER — DOXYCYCLINE HYCLATE 100 MG PO CAPS: 100.0000 mg | ORAL_CAPSULE | Freq: Two times a day (BID) | ORAL | 0 refills | Status: DC

## 2015-12-30 NOTE — Progress Notes (Signed)
Patient ID: Diamond Miller, female   DOB: 1932/11/03, 82 y.o.   MRN: [id number]   Subjective:  By signing my name below, I, Stann Ore, attest that this documentation has been prepared under the direction and in the presence of Nilda Simmer, MD. Electronically Signed: Stann Ore, Scribe. 12/29/2015 , 2:36 PM .  Patient was seen in Room 2 .   Patient ID: Diamond Miller, female    DOB: [birth year], 82 y.o.   MRN: [id number]  12/30/2015  Toe Pain (x 4 days -NKI-)   HPI Patient complains of right great toe pain with redness that was initially notice 4 days ago. She's applied peroxide over the area. She states the pain is much improved today. She's tried soaking the toe in epsom salt for 30 minutes one-time yesterday. She notes waking up at night due to the pain. She denies any numbness in her feet. She didn't take her fluid pill this morning because it causes her to urinate more often.  Denies fever/chills/sweats.  No open wound along toe.  She was surprised that temperature is 99.0.   Her BP was elevated today, but she believes it is due to the toe pain and low grade fever of 99. She has a pacemaker in place.   She also reports falling 2 days ago when going to her bathroom, she fell on something and hit her left flank. She only noticed bruising over the area affected yesterday when she was taking a bath. She denies any pain when taking a deep breath. Denies SOB or coughing.  No other sites of bleeding.  No head trauma with fall.   Review of Systems  Constitutional: Positive for fatigue and fever. Negative for chills and diaphoresis.  Respiratory: Negative for shortness of breath, wheezing and stridor.   Cardiovascular: Positive for leg swelling. Negative for chest pain and palpitations.  Gastrointestinal: Negative for abdominal pain, diarrhea, nausea and vomiting.  Musculoskeletal: Positive for joint swelling and myalgias. Negative for arthralgias and gait problem.  Skin: Negative  for rash.  Neurological: Negative for weakness and numbness.    Past Medical History:  Diagnosis Date  . Acid reflux   . Anemia 1957  . Anxiety   . Chronic back pain   . COPD (chronic obstructive pulmonary disease) (HCC)   . Degenerative disc disease, cervical   . Dysrhythmia    "skips" (06/12/2013)  . Exertional shortness of breath    "recently" (06/12/2013)  . High cholesterol   . History of blood transfusion    "think it was when I had knee OR" (06/12/2013)  . Hypertension   . Liver cirrhosis (HCC)   . Pacemaker, Medtronic placed 06/01/13 06/11/2013  . Scoliosis   . Stroke (HCC) 05/14/2013   denies residual on 06/12/2013   Past Surgical History:  Procedure Laterality Date  . CATARACT EXTRACTION W/ INTRAOCULAR LENS  IMPLANT, BILATERAL Bilateral 2006-2007  . INSERT / REPLACE / REMOVE PACEMAKER  06/11/2013  . JOINT REPLACEMENT    . PACEMAKER INSERTION  06/01/13   Dr. Royann Shivers Medtronic device  . PERMANENT PACEMAKER INSERTION N/A 06/11/2013   Procedure: PERMANENT PACEMAKER INSERTION;  Surgeon: Thurmon Fair, MD;  Location: [facility] CATH LAB;  Service: Cardiovascular;  Laterality: N/A;  . REPLACEMENT TOTAL KNEE Right 2010  . TEE WITHOUT CARDIOVERSION N/A 05/17/2013   Procedure: TRANSESOPHAGEAL ECHOCARDIOGRAM (TEE);  Surgeon: Vesta Mixer, MD;  Location: [facility] ENDOSCOPY;  Service: Cardiovascular;  Laterality: N/A;  . TONSILLECTOMY  ~ 1961   Allergies  Allergen  Reactions  . Aspirin Other (See Comments)    Liver function  . Tylenol [Acetaminophen] Other (See Comments)    Liver function   . Penicillins Rash  . Zyrtec [Cetirizine] Rash    Social History   Social History  . Marital status: Widowed    Spouse name: N/A  . Number of children: N/A  . Years of education: N/A   Occupational History  . Not on file.   Social History Main Topics  . Smoking status: Never Smoker  . Smokeless tobacco: Never Used  . Alcohol use No  . Drug use: No  . Sexual activity: No   Other  Topics Concern  . Not on file   Social History Narrative  . No narrative on file   Family History  Problem Relation Age of Onset  . Heart disease Mother   . Hypertension Sister   . Dementia Brother 60       Objective:    BP (!) 150/86 (BP Location: Left Arm, Patient Position: Sitting, Cuff Size: Normal)   Pulse 67   Temp 99.1 F (37.3 C) (Oral)   Resp 20   Ht 4' 9.5" (1.461 m)   Wt 151 lb 9.6 oz (68.8 kg)   SpO2 97%   BMI 32.24 kg/m  Physical Exam  Constitutional: She is oriented to person, place, and time. She appears well-developed and well-nourished. No distress.  HENT:  Head: Normocephalic and atraumatic.  Eyes: EOM are normal. Pupils are equal, round, and reactive to light.  Neck: Neck supple.  Cardiovascular: Normal rate, regular rhythm and normal heart sounds.   Pulses:      Dorsalis pedis pulses are 2+ on the right side, and 2+ on the left side.  Pulmonary/Chest: Effort normal and breath sounds normal. No respiratory distress.  4x3 cm of ecchymosis along lateral chest wall that was non tender to palpation  Musculoskeletal: Normal range of motion.  Mild erythema over edge of first toe nail bed, not tender medially, no fluctuance, no skin breakdown, no warmth, cap refill < 3 seconds; cyanosis of all toes present.  No streaking along first toe or along medial aspect of foot.  No first MTP joint swelling; full ROM of first MTP joint.  Neurological: She is alert and oriented to person, place, and time.  Skin: Skin is warm and dry.  Psychiatric: She has a normal mood and affect. Her behavior is normal.  Nursing note and vitals reviewed.  Dg Toe Great Right  Result Date: 12/30/2015 CLINICAL DATA:  pain, swelling, redness R great toe EXAM: RIGHT GREAT TOE COMPARISON:  None. FINDINGS: Mild diffuse osteopenia. No fracture, dislocation, cortical erosion, or other acute abnormality. No radiodense foreign body. No subcutaneous gas. No significant osseous degenerative change.  IMPRESSION: 1. Mild osteopenia.  No acute abnormality. Electronically Signed   By: Corlis Leak M.D.   On: 12/30/2015 14:54       Assessment & Plan:   1. Great toe pain, right   2. Contusion, chest wall, left, initial encounter   3. Cirrhosis, primary biliary (HCC)   4. Osteopenia   5. Ingrown right big toenail    -new. -recommend soaking first R toe twice daily in warm water. -rx for Doxycycline provided. -RTC for worsening pain, redness, swelling, or fever. -obtain labs due to history of cirrhosis. -supportive care to chest wall contusion; patient no aware of contusion until showering; thus, imaging not warranted at this time. Orders Placed This Encounter  Procedures  . DG Toe  Great Right    Standing Status:   Future    Number of Occurrences:   1    Standing Expiration Date:   12/29/2016    Order Specific Question:   Reason for Exam (SYMPTOM  OR DIAGNOSIS REQUIRED)    Answer:   pain, swelling, redness R great toe    Order Specific Question:   Preferred imaging location?    Answer:   External  . Comprehensive metabolic panel  . POCT CBC   Meds ordered this encounter  Medications  . DISCONTD: doxycycline (VIBRAMYCIN) 100 MG capsule    Sig: Take 1 capsule (100 mg total) by mouth 2 (two) times daily.    Dispense:  20 capsule    Refill:  0    No Follow-up on file.   Rose Hippler Paulita FujitaMartin Teriah Muela, M.D. [facility]  [facility] [address] [place], [place]  [id number] [phone] phone [phone] fax

## 2015-12-30 NOTE — Patient Instructions (Addendum)
1. Soak toe in warm water twice daily for 20 minutes each session. 2.  Take antibiotic twice daily (Doxycycline). 3. Return for fever, increasing pain, increasing redness.     IF you received an x-ray today, you will receive an invoice from [facility]. Please contact [facility] at [phone] with questions or concerns regarding your invoice.   IF you received labwork today, you will receive an invoice from [facility]. Please contact [facility] at [phone] with questions or concerns regarding your invoice.   Our billing staff will not be able to assist you with questions regarding bills from these companies.  You will be contacted with the lab results as soon as they are available. The fastest way to get your results is to activate your My Chart account. Instructions are located on the last page of this paperwork. If you have not heard from us regarding the results in 2 weeks, please contact this office.

## 2015-12-31 ENCOUNTER — Other Ambulatory Visit: Payer: Self-pay | Admitting: Cardiovascular Disease

## 2015-12-31 LAB — COMPREHENSIVE METABOLIC PANEL
ALK PHOS: 332 U/L — AB (ref 33–130)
ALT: 45 U/L — AB (ref 6–29)
AST: 59 U/L — AB (ref 10–35)
Albumin: 3.5 g/dL — ABNORMAL LOW (ref 3.6–5.1)
BUN: 16 mg/dL (ref 7–25)
CO2: 29 mmol/L (ref 20–31)
Calcium: 9.2 mg/dL (ref 8.6–10.4)
Chloride: 94 mmol/L — ABNORMAL LOW (ref 98–110)
Creat: 0.9 mg/dL — ABNORMAL HIGH (ref 0.60–0.88)
GLUCOSE: 98 mg/dL (ref 65–99)
Potassium: 4.5 mmol/L (ref 3.5–5.3)
SODIUM: 131 mmol/L — AB (ref 135–146)
TOTAL PROTEIN: 6.7 g/dL (ref 6.1–8.1)
Total Bilirubin: 0.5 mg/dL (ref 0.2–1.2)

## 2016-01-06 NOTE — Telephone Encounter (Signed)
error 

## 2016-01-16 ENCOUNTER — Ambulatory Visit (INDEPENDENT_AMBULATORY_CARE_PROVIDER_SITE_OTHER): Payer: Commercial Managed Care - HMO | Admitting: Family Medicine

## 2016-01-16 VITALS — BP 128/68 | HR 61 | Temp 97.9°F | Resp 20 | Ht <= 58 in | Wt 149.0 lb

## 2016-01-16 DIAGNOSIS — L600 Ingrowing nail: Secondary | ICD-10-CM | POA: Diagnosis not present

## 2016-01-16 MED ORDER — DOXYCYCLINE HYCLATE 100 MG PO CAPS: 100.0000 mg | ORAL_CAPSULE | Freq: Two times a day (BID) | ORAL | 0 refills | Status: DC

## 2016-01-16 MED ORDER — KETOCONAZOLE 2 % EX CREA: 1.0000 "application " | TOPICAL_CREAM | Freq: Every day | CUTANEOUS | 0 refills | Status: AC

## 2016-01-16 NOTE — Progress Notes (Signed)
Subjective:  By signing my name below, I, Diamond Miller, attest that this documentation has been prepared under the direction and in the presence of Nilda SimmerKristi Traver Meckes, MD.  Electronically Signed: Andrew Auaven Miller, ED Scribe. 01/18/2016. 9:56 PM.   Patient ID: Diamond Miller, female    DOB: March 19, 1933, 82 y.o.   MRN: [id number]  01/16/2016  Follow-up (right toe f/u)  HPI Diamond Miller is a 82 y.o. female who [facility] for a follow up of right great toe pain. She was evaluated on 8/9 for toe pain. CBC was normal. XR was normal. Treated with doxycycline and recommended soaking toe twice a day.    Pt finished abx 1 week ago. States while taking doxycycline, pain had improved, but once she finished course pain returned. She reports unchanged right great toe pain for the past week but worsened darker appearance. She has pain in toe every night, except for last night. She has continued to soak toe 2x a day but has pain after doing so. Pt has been rubbing cocoa butter on toe. Pt does not have a podiatrist. Has chronic dystrophic nails on bilateral feet. Very reluctant to have toenail removed.  Review of Systems  Constitutional: Negative for chills, diaphoresis, fatigue and fever.  Musculoskeletal: Negative for gait problem.  Skin: Positive for color change. Negative for rash and wound.  Neurological: Negative for weakness and numbness.    Past Medical History:  Diagnosis Date  . Acid reflux   . Anemia 1957  . Anxiety   . Chronic back pain   . COPD (chronic obstructive pulmonary disease) (HCC)   . Degenerative disc disease, cervical   . Dysrhythmia    "skips" (06/12/2013)  . Exertional shortness of breath    "recently" (06/12/2013)  . High cholesterol   . History of blood transfusion    "think it was when I had knee OR" (06/12/2013)  . Hypertension   . Liver cirrhosis (HCC)   . Pacemaker, Medtronic placed 06/01/13 06/11/2013  . Scoliosis   . Stroke (HCC)  05/14/2013   denies residual on 06/12/2013   Past Surgical History:  Procedure Laterality Date  . CATARACT EXTRACTION W/ INTRAOCULAR LENS  IMPLANT, BILATERAL Bilateral 2006-2007  . INSERT / REPLACE / REMOVE PACEMAKER  06/11/2013  . JOINT REPLACEMENT    . PACEMAKER INSERTION  06/01/13   Dr. Royann Shiversroitoru/ Medtronic device  . PERMANENT PACEMAKER INSERTION N/A 06/11/2013   Procedure: PERMANENT PACEMAKER INSERTION;  Surgeon: Thurmon FairMihai Croitoru, MD;  Location: [facility] CATH LAB;  Service: Cardiovascular;  Laterality: N/A;  . REPLACEMENT TOTAL KNEE Right 2010  . TEE WITHOUT CARDIOVERSION N/A 05/17/2013   Procedure: TRANSESOPHAGEAL ECHOCARDIOGRAM (TEE);  Surgeon: Vesta MixerPhilip J Nahser, MD;  [facility];  Service: Cardiovascular;  Laterality: N/A;  . TONSILLECTOMY  ~ 1961   Allergies  Allergen Reactions  . Aspirin Other (See Comments)    Liver function  . Tylenol [Acetaminophen] Other (See Comments)    Liver function   . Penicillins Rash  . Zyrtec [Cetirizine] Rash    Social History   Social History  . Marital status: Widowed    Spouse name: N/A  . Number of children: N/A  . Years of education: N/A   Occupational History  . Not on file.   Social History Main Topics  . Smoking status: Never Smoker  . Smokeless tobacco: Never Used  . Alcohol use No  . Drug use: No  . Sexual activity: No   Other  Topics Concern  . Not on file   Social History Narrative  . No narrative on file   Family History  Problem Relation Age of Onset  . Heart disease Mother   . Hypertension Sister   . Dementia Brother 60    Objective:    BP 128/68   Pulse 61   Temp 97.9 F (36.6 C) (Oral)   Resp 20   Ht 4\' 9"  (1.448 m)   Wt 149 lb (67.6 kg)   SpO2 99%   BMI 32.24 kg/m  Physical Exam  Constitutional: She is oriented to person, place, and time. She appears well-developed and well-nourished. No distress.  HENT:  Head: Normocephalic and atraumatic.  Eyes: Conjunctivae and EOM are normal.  Neck:  Neck supple.  Cardiovascular: Normal rate and regular rhythm.   Murmur heard. Pulmonary/Chest: Effort normal and breath sounds normal. No respiratory distress. She has no wheezes. She has no rales.  Musculoskeletal: Normal range of motion.  Neurological: She is alert and oriented to person, place, and time.  Skin: Skin is warm and dry.  Right great toe with redness and erythema with a bluish hue along lateral nailbed. No fluctuance but minimal swelling along lateral nail bed. Skin break down at corner of the toe nail.  +thickened toenail on R first toe with some scaling.  Psychiatric: She has a normal mood and affect. Her behavior is normal.  Nursing note and vitals reviewed.   Assessment & Plan:   1. Ingrown right big toenail    1) Ingrown toe nail: 10 day course of doxycycline and ketoconazole cream. If symptoms do not improve, refer with podiatry.  Pt agreeable and declined nail resection in office. Due to age and comorbidities, prefer further treatment by podiatry.   Meds ordered this encounter  Medications  . doxycycline (VIBRAMYCIN) 100 MG capsule    Sig: Take 1 capsule (100 mg total) by mouth 2 (two) times daily.    Dispense:  20 capsule    Refill:  0  . ketoconazole (NIZORAL) 2 % cream    Sig: Apply 1 application topically daily.    Dispense:  30 g    Refill:  0    No Follow-up on file.   I personally performed the services described in this documentation, which was scribed in my presence. The recorded information has been reviewed and considered.  Alvine Mostafa Paulita Fujita, M.D. [facility] [address] [place], [place]  [address] [phone] phone [phone] fax

## 2016-01-16 NOTE — Patient Instructions (Addendum)
   IF you received an x-ray today, you will receive an invoice from [facility]. Please contact [facility] at [phone] with questions or concerns regarding your invoice.   IF you received labwork today, you will receive an invoice from [facility]. Please contact [facility] at [phone] with questions or concerns regarding your invoice.   Our billing staff will not be able to assist you with questions regarding bills from these companies.  You will be contacted with the lab results as soon as they are available. The fastest way to get your results is to activate your My Chart account. Instructions are located on the last page of this paperwork. If you have not heard from us regarding the results in 2 weeks, please contact this office.     Ingrown Toenail An ingrown toenail occurs when the corner or sides of your toenail grow into the surrounding skin. The big toe is most commonly affected, but it can happen to any of your toes. If your ingrown toenail is not treated, you will be at risk for infection. CAUSES This condition may be caused by:  Wearing shoes that are too small or tight.  Injury or trauma, such as stubbing your toe or having your toe stepped on.  Improper cutting or care of your toenails.  Being born with (congenital) nail or foot abnormalities, such as having a nail that is too big for your toe. RISK FACTORS Risk factors for an ingrown toenail include:  Age. Your nails tend to thicken as you get older, so ingrown nails are more common in older people.  Diabetes.  Cutting your toenails incorrectly.  Blood circulation problems. SYMPTOMS Symptoms may include:  Pain, soreness, or tenderness.  Redness.  Swelling.  Hardening of the skin surrounding the toe. Your ingrown toenail may be infected if there is fluid, pus, or drainage. DIAGNOSIS  An ingrown toenail may be diagnosed by medical history and physical  exam. If your toenail is infected, your health care provider may test a sample of the drainage. TREATMENT Treatment depends on the severity of your ingrown toenail. Some ingrown toenails may be treated at home. More severe or infected ingrown toenails may require surgery to remove all or part of the nail. Infected ingrown toenails may also be treated with antibiotic medicines. HOME CARE INSTRUCTIONS  If you were prescribed an antibiotic medicine, finish all of it even if you start to feel better.  Soak your foot in warm soapy water for 20 minutes, 3 times per day or as directed by your health care provider.  Carefully lift the edge of the nail away from the sore skin by wedging a small piece of cotton under the corner of the nail. This may help with the pain. Be careful not to cause more injury to the area.  Wear shoes that fit well. If your ingrown toenail is causing you pain, try wearing sandals, if possible.  Trim your toenails regularly and carefully. Do not cut them in a curved shape. Cut your toenails straight across. This prevents injury to the skin at the corners of the toenail.  Keep your feet clean and dry.  If you are having trouble walking and are given crutches by your health care provider, use them as directed.  Do not pick at your toenail or try to remove it yourself.  Take medicines only as directed by your health care provider.  Keep all follow-up visits as directed by your health care provider. This is   important. SEEK MEDICAL CARE IF:  Your symptoms do not improve with treatment. SEEK IMMEDIATE MEDICAL CARE IF:  You have red streaks that start at your foot and go up your leg.  You have a fever.  You have increased redness, swelling, or pain.  You have fluid, blood, or pus coming from your toenail.   This information is not intended to replace advice given to you by your health care provider. Make sure you discuss any questions you have with your health care  provider.   Document Released: 05/06/2000 Document Revised: 09/23/2014 Document Reviewed: 04/02/2014 Elsevier Interactive Patient Education 2016 [employer] Inc.  

## 2016-01-20 ENCOUNTER — Telehealth: Payer: Self-pay | Admitting: Family Medicine

## 2016-01-20 NOTE — Telephone Encounter (Signed)
That's fine

## 2016-01-20 NOTE — Telephone Encounter (Signed)
Marylene Landngela from [facility] is requesting a referral for pt to see Dr Charlsie Merlesegal for infected toe.This is a previous pt of Dr Elease HashimotoMaloney and was seen at urgent care for this problem. Note is in chart.You are listed as pt's primary care Dr with [facility]Angela's call back # is [phone]

## 2016-01-21 NOTE — Telephone Encounter (Signed)
done

## 2016-01-29 ENCOUNTER — Encounter: Payer: Self-pay | Admitting: Podiatry

## 2016-01-29 ENCOUNTER — Ambulatory Visit (INDEPENDENT_AMBULATORY_CARE_PROVIDER_SITE_OTHER): Payer: Commercial Managed Care - HMO | Admitting: Podiatry

## 2016-01-29 VITALS — BP 133/71 | HR 74 | Resp 14

## 2016-01-29 DIAGNOSIS — L03031 Cellulitis of right toe: Secondary | ICD-10-CM | POA: Diagnosis not present

## 2016-01-29 DIAGNOSIS — L03011 Cellulitis of right finger: Secondary | ICD-10-CM

## 2016-01-29 NOTE — Telephone Encounter (Signed)
done

## 2016-01-29 NOTE — Patient Instructions (Signed)

## 2016-01-29 NOTE — Progress Notes (Signed)
   Subjective:    Patient ID: Diamond Miller, female    DOB: November 26, 1932, 82 y.o.   MRN: [id number]  HPI The patient presents here today with right foot; great toe pain since 1 month.   Review of Systems  HENT: Positive for sinus pressure.   Cardiovascular: Positive for leg swelling.       Calf pain with walking  Genitourinary:       Increase urination  Musculoskeletal: Positive for back pain and gait problem.       Joint pain  Allergic/Immunologic: Positive for food allergies.  Hematological: Bruises/bleeds easily.       Bruised easy,slow to heal       Objective:   Physical Exam        Assessment & Plan:

## 2016-01-31 NOTE — Progress Notes (Signed)
Subjective:     Patient ID: Diamond RuddleClara L Miller, female   DOB: Jan 16, 1933, 82 y.o.   MRN: [id number]  HPI patient presents with caregiver with irritation of the right hallux nail both medial lateral side with incurvated-like condition   Review of Systems     Objective:   Physical Exam Neurovascular status unchanged with patient found to have slight redness on the distal lateral medial borders of the right hallux that's localized with no proximal edema erythema drainage and with patient having history being placed on antibiotics by primary physician    Assessment:     Low grade distal paronychia infection of the right hallux    Plan:     Reviewed condition and infiltrated the right hallux 60 mg like Marcaine mixture remove the corners to allow drainage to occur and instructed on soaks. This should be self-limiting but if any issues were to occur reappoint immediately

## 2016-02-08 ENCOUNTER — Encounter: Payer: Self-pay | Admitting: Family Medicine

## 2016-02-15 DIAGNOSIS — G894 Chronic pain syndrome: Secondary | ICD-10-CM | POA: Diagnosis not present

## 2016-02-15 DIAGNOSIS — K219 Gastro-esophageal reflux disease without esophagitis: Secondary | ICD-10-CM | POA: Diagnosis not present

## 2016-02-15 DIAGNOSIS — F419 Anxiety disorder, unspecified: Secondary | ICD-10-CM | POA: Diagnosis not present

## 2016-02-15 DIAGNOSIS — Z23 Encounter for immunization: Secondary | ICD-10-CM | POA: Diagnosis not present

## 2016-02-15 DIAGNOSIS — I2510 Atherosclerotic heart disease of native coronary artery without angina pectoris: Secondary | ICD-10-CM | POA: Diagnosis not present

## 2016-02-16 ENCOUNTER — Telehealth: Payer: Self-pay | Admitting: *Deleted

## 2016-02-16 NOTE — Telephone Encounter (Signed)
Called patient at [phone] (Cell #) to check to see how they were doing from their Paronychia procedure that was performed on Friday, January 29, 2016. Pt stated, "Doing much better and has no pain".

## 2016-02-19 DIAGNOSIS — Z79891 Long term (current) use of opiate analgesic: Secondary | ICD-10-CM | POA: Diagnosis not present

## 2016-02-19 DIAGNOSIS — M5136 Other intervertebral disc degeneration, lumbar region: Secondary | ICD-10-CM | POA: Diagnosis not present

## 2016-02-19 DIAGNOSIS — G894 Chronic pain syndrome: Secondary | ICD-10-CM | POA: Diagnosis not present

## 2016-02-19 DIAGNOSIS — M546 Pain in thoracic spine: Secondary | ICD-10-CM | POA: Diagnosis not present

## 2016-02-25 ENCOUNTER — Ambulatory Visit (INDEPENDENT_AMBULATORY_CARE_PROVIDER_SITE_OTHER): Payer: Commercial Managed Care - HMO | Admitting: *Deleted

## 2016-02-25 ENCOUNTER — Telehealth: Payer: Self-pay | Admitting: Cardiology

## 2016-02-25 DIAGNOSIS — I495 Sick sinus syndrome: Secondary | ICD-10-CM | POA: Diagnosis not present

## 2016-02-25 NOTE — Telephone Encounter (Signed)
LMOVM reminding pt to send remote transmission.   

## 2016-02-26 ENCOUNTER — Encounter: Payer: Self-pay | Admitting: Cardiology

## 2016-02-26 NOTE — Progress Notes (Signed)
Remote pacemaker transmission.   

## 2016-03-02 DIAGNOSIS — M25562 Pain in left knee: Secondary | ICD-10-CM | POA: Diagnosis not present

## 2016-03-02 DIAGNOSIS — G894 Chronic pain syndrome: Secondary | ICD-10-CM | POA: Diagnosis not present

## 2016-03-02 DIAGNOSIS — L089 Local infection of the skin and subcutaneous tissue, unspecified: Secondary | ICD-10-CM | POA: Diagnosis not present

## 2016-03-02 DIAGNOSIS — I10 Essential (primary) hypertension: Secondary | ICD-10-CM | POA: Diagnosis not present

## 2016-03-02 DIAGNOSIS — M419 Scoliosis, unspecified: Secondary | ICD-10-CM | POA: Diagnosis not present

## 2016-03-02 DIAGNOSIS — M5136 Other intervertebral disc degeneration, lumbar region: Secondary | ICD-10-CM | POA: Diagnosis not present

## 2016-03-03 DIAGNOSIS — M25562 Pain in left knee: Secondary | ICD-10-CM | POA: Diagnosis not present

## 2016-03-03 DIAGNOSIS — M5136 Other intervertebral disc degeneration, lumbar region: Secondary | ICD-10-CM | POA: Diagnosis not present

## 2016-03-03 DIAGNOSIS — G894 Chronic pain syndrome: Secondary | ICD-10-CM | POA: Diagnosis not present

## 2016-03-03 DIAGNOSIS — I10 Essential (primary) hypertension: Secondary | ICD-10-CM | POA: Diagnosis not present

## 2016-03-03 DIAGNOSIS — L089 Local infection of the skin and subcutaneous tissue, unspecified: Secondary | ICD-10-CM | POA: Diagnosis not present

## 2016-03-03 DIAGNOSIS — M419 Scoliosis, unspecified: Secondary | ICD-10-CM | POA: Diagnosis not present

## 2016-03-04 DIAGNOSIS — M25562 Pain in left knee: Secondary | ICD-10-CM | POA: Diagnosis not present

## 2016-03-04 DIAGNOSIS — G894 Chronic pain syndrome: Secondary | ICD-10-CM | POA: Diagnosis not present

## 2016-03-04 DIAGNOSIS — M419 Scoliosis, unspecified: Secondary | ICD-10-CM | POA: Diagnosis not present

## 2016-03-04 DIAGNOSIS — L089 Local infection of the skin and subcutaneous tissue, unspecified: Secondary | ICD-10-CM | POA: Diagnosis not present

## 2016-03-04 DIAGNOSIS — M5136 Other intervertebral disc degeneration, lumbar region: Secondary | ICD-10-CM | POA: Diagnosis not present

## 2016-03-04 DIAGNOSIS — I10 Essential (primary) hypertension: Secondary | ICD-10-CM | POA: Diagnosis not present

## 2016-03-06 LAB — CUP PACEART REMOTE DEVICE CHECK
Battery Remaining Longevity: 95 mo
Brady Statistic AP VP Percent: 0.06 %
Brady Statistic AP VS Percent: 67.96 %
Brady Statistic AS VS Percent: 30.8 %
Brady Statistic RV Percent Paced: 1.24 %
Date Time Interrogation Session: 20171005205732
Implantable Lead Implant Date: 20150120
Implantable Lead Location: 753859
Implantable Lead Model: 5076
Lead Channel Impedance Value: 532 Ohm
Lead Channel Impedance Value: 532 Ohm
Lead Channel Impedance Value: 589 Ohm
Lead Channel Pacing Threshold Amplitude: 0.875 V
Lead Channel Sensing Intrinsic Amplitude: 16.5 mV
Lead Channel Sensing Intrinsic Amplitude: 2.75 mV
Lead Channel Sensing Intrinsic Amplitude: 2.75 mV
Lead Channel Setting Pacing Amplitude: 1.5 V
Lead Channel Setting Pacing Amplitude: 2 V
MDC IDC LEAD IMPLANT DT: 20150120
MDC IDC LEAD LOCATION: 753860
MDC IDC MSMT BATTERY VOLTAGE: 3.02 V
MDC IDC MSMT LEADCHNL RA IMPEDANCE VALUE: 418 Ohm
MDC IDC MSMT LEADCHNL RA PACING THRESHOLD AMPLITUDE: 0.625 V
MDC IDC MSMT LEADCHNL RA PACING THRESHOLD PULSEWIDTH: 0.4 ms
MDC IDC MSMT LEADCHNL RV PACING THRESHOLD PULSEWIDTH: 0.4 ms
MDC IDC MSMT LEADCHNL RV SENSING INTR AMPL: 16.5 mV
MDC IDC SET LEADCHNL RV PACING PULSEWIDTH: 0.4 ms
MDC IDC SET LEADCHNL RV SENSING SENSITIVITY: 2.8 mV
MDC IDC STAT BRADY AS VP PERCENT: 1.17 %
MDC IDC STAT BRADY RA PERCENT PACED: 68.03 %

## 2016-03-07 DIAGNOSIS — G894 Chronic pain syndrome: Secondary | ICD-10-CM | POA: Diagnosis not present

## 2016-03-07 DIAGNOSIS — M419 Scoliosis, unspecified: Secondary | ICD-10-CM | POA: Diagnosis not present

## 2016-03-07 DIAGNOSIS — I10 Essential (primary) hypertension: Secondary | ICD-10-CM | POA: Diagnosis not present

## 2016-03-07 DIAGNOSIS — M25562 Pain in left knee: Secondary | ICD-10-CM | POA: Diagnosis not present

## 2016-03-07 DIAGNOSIS — M5136 Other intervertebral disc degeneration, lumbar region: Secondary | ICD-10-CM | POA: Diagnosis not present

## 2016-03-07 DIAGNOSIS — L089 Local infection of the skin and subcutaneous tissue, unspecified: Secondary | ICD-10-CM | POA: Diagnosis not present

## 2016-03-08 DIAGNOSIS — L089 Local infection of the skin and subcutaneous tissue, unspecified: Secondary | ICD-10-CM | POA: Diagnosis not present

## 2016-03-08 DIAGNOSIS — G894 Chronic pain syndrome: Secondary | ICD-10-CM | POA: Diagnosis not present

## 2016-03-08 DIAGNOSIS — M419 Scoliosis, unspecified: Secondary | ICD-10-CM | POA: Diagnosis not present

## 2016-03-08 DIAGNOSIS — M25562 Pain in left knee: Secondary | ICD-10-CM | POA: Diagnosis not present

## 2016-03-08 DIAGNOSIS — M5136 Other intervertebral disc degeneration, lumbar region: Secondary | ICD-10-CM | POA: Diagnosis not present

## 2016-03-08 DIAGNOSIS — I10 Essential (primary) hypertension: Secondary | ICD-10-CM | POA: Diagnosis not present

## 2016-03-09 DIAGNOSIS — G894 Chronic pain syndrome: Secondary | ICD-10-CM | POA: Diagnosis not present

## 2016-03-09 DIAGNOSIS — L089 Local infection of the skin and subcutaneous tissue, unspecified: Secondary | ICD-10-CM | POA: Diagnosis not present

## 2016-03-09 DIAGNOSIS — M5136 Other intervertebral disc degeneration, lumbar region: Secondary | ICD-10-CM | POA: Diagnosis not present

## 2016-03-09 DIAGNOSIS — M419 Scoliosis, unspecified: Secondary | ICD-10-CM | POA: Diagnosis not present

## 2016-03-09 DIAGNOSIS — I10 Essential (primary) hypertension: Secondary | ICD-10-CM | POA: Diagnosis not present

## 2016-03-09 DIAGNOSIS — M25562 Pain in left knee: Secondary | ICD-10-CM | POA: Diagnosis not present

## 2016-03-10 DIAGNOSIS — M25562 Pain in left knee: Secondary | ICD-10-CM | POA: Diagnosis not present

## 2016-03-10 DIAGNOSIS — M419 Scoliosis, unspecified: Secondary | ICD-10-CM | POA: Diagnosis not present

## 2016-03-10 DIAGNOSIS — I10 Essential (primary) hypertension: Secondary | ICD-10-CM | POA: Diagnosis not present

## 2016-03-10 DIAGNOSIS — G894 Chronic pain syndrome: Secondary | ICD-10-CM | POA: Diagnosis not present

## 2016-03-10 DIAGNOSIS — M5136 Other intervertebral disc degeneration, lumbar region: Secondary | ICD-10-CM | POA: Diagnosis not present

## 2016-03-10 DIAGNOSIS — L089 Local infection of the skin and subcutaneous tissue, unspecified: Secondary | ICD-10-CM | POA: Diagnosis not present

## 2016-03-11 DIAGNOSIS — M5136 Other intervertebral disc degeneration, lumbar region: Secondary | ICD-10-CM | POA: Diagnosis not present

## 2016-03-14 DIAGNOSIS — M25562 Pain in left knee: Secondary | ICD-10-CM | POA: Diagnosis not present

## 2016-03-14 DIAGNOSIS — M5136 Other intervertebral disc degeneration, lumbar region: Secondary | ICD-10-CM | POA: Diagnosis not present

## 2016-03-14 DIAGNOSIS — M419 Scoliosis, unspecified: Secondary | ICD-10-CM | POA: Diagnosis not present

## 2016-03-14 DIAGNOSIS — L089 Local infection of the skin and subcutaneous tissue, unspecified: Secondary | ICD-10-CM | POA: Diagnosis not present

## 2016-03-14 DIAGNOSIS — I10 Essential (primary) hypertension: Secondary | ICD-10-CM | POA: Diagnosis not present

## 2016-03-14 DIAGNOSIS — G894 Chronic pain syndrome: Secondary | ICD-10-CM | POA: Diagnosis not present

## 2016-03-15 DIAGNOSIS — M25562 Pain in left knee: Secondary | ICD-10-CM | POA: Diagnosis not present

## 2016-03-15 DIAGNOSIS — I10 Essential (primary) hypertension: Secondary | ICD-10-CM | POA: Diagnosis not present

## 2016-03-15 DIAGNOSIS — L089 Local infection of the skin and subcutaneous tissue, unspecified: Secondary | ICD-10-CM | POA: Diagnosis not present

## 2016-03-15 DIAGNOSIS — M419 Scoliosis, unspecified: Secondary | ICD-10-CM | POA: Diagnosis not present

## 2016-03-15 DIAGNOSIS — G894 Chronic pain syndrome: Secondary | ICD-10-CM | POA: Diagnosis not present

## 2016-03-15 DIAGNOSIS — M5136 Other intervertebral disc degeneration, lumbar region: Secondary | ICD-10-CM | POA: Diagnosis not present

## 2016-03-16 DIAGNOSIS — I10 Essential (primary) hypertension: Secondary | ICD-10-CM | POA: Diagnosis not present

## 2016-03-16 DIAGNOSIS — M5136 Other intervertebral disc degeneration, lumbar region: Secondary | ICD-10-CM | POA: Diagnosis not present

## 2016-03-16 DIAGNOSIS — G894 Chronic pain syndrome: Secondary | ICD-10-CM | POA: Diagnosis not present

## 2016-03-16 DIAGNOSIS — L089 Local infection of the skin and subcutaneous tissue, unspecified: Secondary | ICD-10-CM | POA: Diagnosis not present

## 2016-03-16 DIAGNOSIS — M25562 Pain in left knee: Secondary | ICD-10-CM | POA: Diagnosis not present

## 2016-03-16 DIAGNOSIS — M419 Scoliosis, unspecified: Secondary | ICD-10-CM | POA: Diagnosis not present

## 2016-03-17 DIAGNOSIS — M25562 Pain in left knee: Secondary | ICD-10-CM | POA: Diagnosis not present

## 2016-03-17 DIAGNOSIS — I10 Essential (primary) hypertension: Secondary | ICD-10-CM | POA: Diagnosis not present

## 2016-03-17 DIAGNOSIS — G894 Chronic pain syndrome: Secondary | ICD-10-CM | POA: Diagnosis not present

## 2016-03-17 DIAGNOSIS — M419 Scoliosis, unspecified: Secondary | ICD-10-CM | POA: Diagnosis not present

## 2016-03-17 DIAGNOSIS — M5136 Other intervertebral disc degeneration, lumbar region: Secondary | ICD-10-CM | POA: Diagnosis not present

## 2016-03-17 DIAGNOSIS — L089 Local infection of the skin and subcutaneous tissue, unspecified: Secondary | ICD-10-CM | POA: Diagnosis not present

## 2016-03-18 DIAGNOSIS — G894 Chronic pain syndrome: Secondary | ICD-10-CM | POA: Diagnosis not present

## 2016-03-18 DIAGNOSIS — L089 Local infection of the skin and subcutaneous tissue, unspecified: Secondary | ICD-10-CM | POA: Diagnosis not present

## 2016-03-18 DIAGNOSIS — M25562 Pain in left knee: Secondary | ICD-10-CM | POA: Diagnosis not present

## 2016-03-18 DIAGNOSIS — I10 Essential (primary) hypertension: Secondary | ICD-10-CM | POA: Diagnosis not present

## 2016-03-18 DIAGNOSIS — M5136 Other intervertebral disc degeneration, lumbar region: Secondary | ICD-10-CM | POA: Diagnosis not present

## 2016-03-18 DIAGNOSIS — M419 Scoliosis, unspecified: Secondary | ICD-10-CM | POA: Diagnosis not present

## 2016-03-21 DIAGNOSIS — G894 Chronic pain syndrome: Secondary | ICD-10-CM | POA: Diagnosis not present

## 2016-03-21 DIAGNOSIS — M5136 Other intervertebral disc degeneration, lumbar region: Secondary | ICD-10-CM | POA: Diagnosis not present

## 2016-03-21 DIAGNOSIS — L089 Local infection of the skin and subcutaneous tissue, unspecified: Secondary | ICD-10-CM | POA: Diagnosis not present

## 2016-03-21 DIAGNOSIS — I10 Essential (primary) hypertension: Secondary | ICD-10-CM | POA: Diagnosis not present

## 2016-03-21 DIAGNOSIS — M25562 Pain in left knee: Secondary | ICD-10-CM | POA: Diagnosis not present

## 2016-03-21 DIAGNOSIS — M419 Scoliosis, unspecified: Secondary | ICD-10-CM | POA: Diagnosis not present

## 2016-03-22 DIAGNOSIS — M419 Scoliosis, unspecified: Secondary | ICD-10-CM | POA: Diagnosis not present

## 2016-03-22 DIAGNOSIS — M25562 Pain in left knee: Secondary | ICD-10-CM | POA: Diagnosis not present

## 2016-03-22 DIAGNOSIS — G894 Chronic pain syndrome: Secondary | ICD-10-CM | POA: Diagnosis not present

## 2016-03-22 DIAGNOSIS — I10 Essential (primary) hypertension: Secondary | ICD-10-CM | POA: Diagnosis not present

## 2016-03-22 DIAGNOSIS — M5136 Other intervertebral disc degeneration, lumbar region: Secondary | ICD-10-CM | POA: Diagnosis not present

## 2016-03-22 DIAGNOSIS — L089 Local infection of the skin and subcutaneous tissue, unspecified: Secondary | ICD-10-CM | POA: Diagnosis not present

## 2016-03-23 DIAGNOSIS — L089 Local infection of the skin and subcutaneous tissue, unspecified: Secondary | ICD-10-CM | POA: Diagnosis not present

## 2016-03-23 DIAGNOSIS — G894 Chronic pain syndrome: Secondary | ICD-10-CM | POA: Diagnosis not present

## 2016-03-23 DIAGNOSIS — M419 Scoliosis, unspecified: Secondary | ICD-10-CM | POA: Diagnosis not present

## 2016-03-23 DIAGNOSIS — M5136 Other intervertebral disc degeneration, lumbar region: Secondary | ICD-10-CM | POA: Diagnosis not present

## 2016-03-23 DIAGNOSIS — M25562 Pain in left knee: Secondary | ICD-10-CM | POA: Diagnosis not present

## 2016-03-23 DIAGNOSIS — I10 Essential (primary) hypertension: Secondary | ICD-10-CM | POA: Diagnosis not present

## 2016-03-24 DIAGNOSIS — M5136 Other intervertebral disc degeneration, lumbar region: Secondary | ICD-10-CM | POA: Diagnosis not present

## 2016-03-24 DIAGNOSIS — I10 Essential (primary) hypertension: Secondary | ICD-10-CM | POA: Diagnosis not present

## 2016-03-24 DIAGNOSIS — M25562 Pain in left knee: Secondary | ICD-10-CM | POA: Diagnosis not present

## 2016-03-24 DIAGNOSIS — G894 Chronic pain syndrome: Secondary | ICD-10-CM | POA: Diagnosis not present

## 2016-03-24 DIAGNOSIS — L089 Local infection of the skin and subcutaneous tissue, unspecified: Secondary | ICD-10-CM | POA: Diagnosis not present

## 2016-03-24 DIAGNOSIS — M419 Scoliosis, unspecified: Secondary | ICD-10-CM | POA: Diagnosis not present

## 2016-03-29 DIAGNOSIS — M419 Scoliosis, unspecified: Secondary | ICD-10-CM | POA: Diagnosis not present

## 2016-03-29 DIAGNOSIS — L089 Local infection of the skin and subcutaneous tissue, unspecified: Secondary | ICD-10-CM | POA: Diagnosis not present

## 2016-03-29 DIAGNOSIS — M5136 Other intervertebral disc degeneration, lumbar region: Secondary | ICD-10-CM | POA: Diagnosis not present

## 2016-03-29 DIAGNOSIS — M25562 Pain in left knee: Secondary | ICD-10-CM | POA: Diagnosis not present

## 2016-03-29 DIAGNOSIS — G894 Chronic pain syndrome: Secondary | ICD-10-CM | POA: Diagnosis not present

## 2016-03-29 DIAGNOSIS — I10 Essential (primary) hypertension: Secondary | ICD-10-CM | POA: Diagnosis not present

## 2016-03-30 DIAGNOSIS — M419 Scoliosis, unspecified: Secondary | ICD-10-CM | POA: Diagnosis not present

## 2016-03-30 DIAGNOSIS — G894 Chronic pain syndrome: Secondary | ICD-10-CM | POA: Diagnosis not present

## 2016-03-30 DIAGNOSIS — M5136 Other intervertebral disc degeneration, lumbar region: Secondary | ICD-10-CM | POA: Diagnosis not present

## 2016-03-30 DIAGNOSIS — M25562 Pain in left knee: Secondary | ICD-10-CM | POA: Diagnosis not present

## 2016-03-30 DIAGNOSIS — L089 Local infection of the skin and subcutaneous tissue, unspecified: Secondary | ICD-10-CM | POA: Diagnosis not present

## 2016-03-30 DIAGNOSIS — I10 Essential (primary) hypertension: Secondary | ICD-10-CM | POA: Diagnosis not present

## 2016-04-01 DIAGNOSIS — L089 Local infection of the skin and subcutaneous tissue, unspecified: Secondary | ICD-10-CM | POA: Diagnosis not present

## 2016-04-01 DIAGNOSIS — I10 Essential (primary) hypertension: Secondary | ICD-10-CM | POA: Diagnosis not present

## 2016-04-01 DIAGNOSIS — M419 Scoliosis, unspecified: Secondary | ICD-10-CM | POA: Diagnosis not present

## 2016-04-01 DIAGNOSIS — G894 Chronic pain syndrome: Secondary | ICD-10-CM | POA: Diagnosis not present

## 2016-04-01 DIAGNOSIS — M25562 Pain in left knee: Secondary | ICD-10-CM | POA: Diagnosis not present

## 2016-04-01 DIAGNOSIS — M5136 Other intervertebral disc degeneration, lumbar region: Secondary | ICD-10-CM | POA: Diagnosis not present

## 2016-04-05 DIAGNOSIS — I10 Essential (primary) hypertension: Secondary | ICD-10-CM | POA: Diagnosis not present

## 2016-04-05 DIAGNOSIS — G894 Chronic pain syndrome: Secondary | ICD-10-CM | POA: Diagnosis not present

## 2016-04-05 DIAGNOSIS — M419 Scoliosis, unspecified: Secondary | ICD-10-CM | POA: Diagnosis not present

## 2016-04-05 DIAGNOSIS — M5136 Other intervertebral disc degeneration, lumbar region: Secondary | ICD-10-CM | POA: Diagnosis not present

## 2016-04-05 DIAGNOSIS — M25562 Pain in left knee: Secondary | ICD-10-CM | POA: Diagnosis not present

## 2016-04-05 DIAGNOSIS — L089 Local infection of the skin and subcutaneous tissue, unspecified: Secondary | ICD-10-CM | POA: Diagnosis not present

## 2016-04-06 DIAGNOSIS — M419 Scoliosis, unspecified: Secondary | ICD-10-CM | POA: Diagnosis not present

## 2016-04-06 DIAGNOSIS — G894 Chronic pain syndrome: Secondary | ICD-10-CM | POA: Diagnosis not present

## 2016-04-06 DIAGNOSIS — L089 Local infection of the skin and subcutaneous tissue, unspecified: Secondary | ICD-10-CM | POA: Diagnosis not present

## 2016-04-06 DIAGNOSIS — I10 Essential (primary) hypertension: Secondary | ICD-10-CM | POA: Diagnosis not present

## 2016-04-06 DIAGNOSIS — M25562 Pain in left knee: Secondary | ICD-10-CM | POA: Diagnosis not present

## 2016-04-06 DIAGNOSIS — M5136 Other intervertebral disc degeneration, lumbar region: Secondary | ICD-10-CM | POA: Diagnosis not present

## 2016-04-08 DIAGNOSIS — M5136 Other intervertebral disc degeneration, lumbar region: Secondary | ICD-10-CM | POA: Diagnosis not present

## 2016-04-08 DIAGNOSIS — M25562 Pain in left knee: Secondary | ICD-10-CM | POA: Diagnosis not present

## 2016-04-08 DIAGNOSIS — I10 Essential (primary) hypertension: Secondary | ICD-10-CM | POA: Diagnosis not present

## 2016-04-08 DIAGNOSIS — M419 Scoliosis, unspecified: Secondary | ICD-10-CM | POA: Diagnosis not present

## 2016-04-08 DIAGNOSIS — L089 Local infection of the skin and subcutaneous tissue, unspecified: Secondary | ICD-10-CM | POA: Diagnosis not present

## 2016-04-08 DIAGNOSIS — G894 Chronic pain syndrome: Secondary | ICD-10-CM | POA: Diagnosis not present

## 2016-04-11 DIAGNOSIS — M25562 Pain in left knee: Secondary | ICD-10-CM | POA: Diagnosis not present

## 2016-04-11 DIAGNOSIS — G894 Chronic pain syndrome: Secondary | ICD-10-CM | POA: Diagnosis not present

## 2016-04-11 DIAGNOSIS — L089 Local infection of the skin and subcutaneous tissue, unspecified: Secondary | ICD-10-CM | POA: Diagnosis not present

## 2016-04-11 DIAGNOSIS — M5136 Other intervertebral disc degeneration, lumbar region: Secondary | ICD-10-CM | POA: Diagnosis not present

## 2016-04-11 DIAGNOSIS — I10 Essential (primary) hypertension: Secondary | ICD-10-CM | POA: Diagnosis not present

## 2016-04-11 DIAGNOSIS — M419 Scoliosis, unspecified: Secondary | ICD-10-CM | POA: Diagnosis not present

## 2016-04-12 DIAGNOSIS — G894 Chronic pain syndrome: Secondary | ICD-10-CM | POA: Diagnosis not present

## 2016-04-12 DIAGNOSIS — L089 Local infection of the skin and subcutaneous tissue, unspecified: Secondary | ICD-10-CM | POA: Diagnosis not present

## 2016-04-12 DIAGNOSIS — M419 Scoliosis, unspecified: Secondary | ICD-10-CM | POA: Diagnosis not present

## 2016-04-12 DIAGNOSIS — M25562 Pain in left knee: Secondary | ICD-10-CM | POA: Diagnosis not present

## 2016-04-12 DIAGNOSIS — M5136 Other intervertebral disc degeneration, lumbar region: Secondary | ICD-10-CM | POA: Diagnosis not present

## 2016-04-12 DIAGNOSIS — I10 Essential (primary) hypertension: Secondary | ICD-10-CM | POA: Diagnosis not present

## 2016-04-13 DIAGNOSIS — I10 Essential (primary) hypertension: Secondary | ICD-10-CM | POA: Diagnosis not present

## 2016-04-13 DIAGNOSIS — M25562 Pain in left knee: Secondary | ICD-10-CM | POA: Diagnosis not present

## 2016-04-13 DIAGNOSIS — G894 Chronic pain syndrome: Secondary | ICD-10-CM | POA: Diagnosis not present

## 2016-04-13 DIAGNOSIS — L089 Local infection of the skin and subcutaneous tissue, unspecified: Secondary | ICD-10-CM | POA: Diagnosis not present

## 2016-04-13 DIAGNOSIS — M419 Scoliosis, unspecified: Secondary | ICD-10-CM | POA: Diagnosis not present

## 2016-04-13 DIAGNOSIS — M5136 Other intervertebral disc degeneration, lumbar region: Secondary | ICD-10-CM | POA: Diagnosis not present

## 2016-04-18 DIAGNOSIS — F419 Anxiety disorder, unspecified: Secondary | ICD-10-CM | POA: Diagnosis not present

## 2016-04-18 DIAGNOSIS — K219 Gastro-esophageal reflux disease without esophagitis: Secondary | ICD-10-CM | POA: Diagnosis not present

## 2016-04-18 DIAGNOSIS — G894 Chronic pain syndrome: Secondary | ICD-10-CM | POA: Diagnosis not present

## 2016-04-18 DIAGNOSIS — I2510 Atherosclerotic heart disease of native coronary artery without angina pectoris: Secondary | ICD-10-CM | POA: Diagnosis not present

## 2016-04-18 DIAGNOSIS — H029 Unspecified disorder of eyelid: Secondary | ICD-10-CM | POA: Diagnosis not present

## 2016-04-18 DIAGNOSIS — Z79899 Other long term (current) drug therapy: Secondary | ICD-10-CM | POA: Diagnosis not present

## 2016-04-19 DIAGNOSIS — M25562 Pain in left knee: Secondary | ICD-10-CM | POA: Diagnosis not present

## 2016-04-19 DIAGNOSIS — G894 Chronic pain syndrome: Secondary | ICD-10-CM | POA: Diagnosis not present

## 2016-04-19 DIAGNOSIS — I10 Essential (primary) hypertension: Secondary | ICD-10-CM | POA: Diagnosis not present

## 2016-04-19 DIAGNOSIS — L089 Local infection of the skin and subcutaneous tissue, unspecified: Secondary | ICD-10-CM | POA: Diagnosis not present

## 2016-04-19 DIAGNOSIS — M419 Scoliosis, unspecified: Secondary | ICD-10-CM | POA: Diagnosis not present

## 2016-04-19 DIAGNOSIS — M5136 Other intervertebral disc degeneration, lumbar region: Secondary | ICD-10-CM | POA: Diagnosis not present

## 2016-04-20 DIAGNOSIS — G894 Chronic pain syndrome: Secondary | ICD-10-CM | POA: Diagnosis not present

## 2016-04-20 DIAGNOSIS — M419 Scoliosis, unspecified: Secondary | ICD-10-CM | POA: Diagnosis not present

## 2016-04-20 DIAGNOSIS — I10 Essential (primary) hypertension: Secondary | ICD-10-CM | POA: Diagnosis not present

## 2016-04-20 DIAGNOSIS — M25562 Pain in left knee: Secondary | ICD-10-CM | POA: Diagnosis not present

## 2016-04-20 DIAGNOSIS — M5136 Other intervertebral disc degeneration, lumbar region: Secondary | ICD-10-CM | POA: Diagnosis not present

## 2016-04-20 DIAGNOSIS — L089 Local infection of the skin and subcutaneous tissue, unspecified: Secondary | ICD-10-CM | POA: Diagnosis not present

## 2016-04-21 DIAGNOSIS — G894 Chronic pain syndrome: Secondary | ICD-10-CM | POA: Diagnosis not present

## 2016-04-21 DIAGNOSIS — L089 Local infection of the skin and subcutaneous tissue, unspecified: Secondary | ICD-10-CM | POA: Diagnosis not present

## 2016-04-21 DIAGNOSIS — I10 Essential (primary) hypertension: Secondary | ICD-10-CM | POA: Diagnosis not present

## 2016-04-21 DIAGNOSIS — M5136 Other intervertebral disc degeneration, lumbar region: Secondary | ICD-10-CM | POA: Diagnosis not present

## 2016-04-21 DIAGNOSIS — M419 Scoliosis, unspecified: Secondary | ICD-10-CM | POA: Diagnosis not present

## 2016-04-21 DIAGNOSIS — M25562 Pain in left knee: Secondary | ICD-10-CM | POA: Diagnosis not present

## 2016-04-26 ENCOUNTER — Other Ambulatory Visit: Payer: Self-pay | Admitting: Family Medicine

## 2016-04-26 ENCOUNTER — Other Ambulatory Visit: Payer: Self-pay | Admitting: Cardiovascular Disease

## 2016-04-26 DIAGNOSIS — J309 Allergic rhinitis, unspecified: Secondary | ICD-10-CM

## 2016-04-26 NOTE — Telephone Encounter (Signed)
Rx has been sent to the pharmacy electronically. ° °

## 2016-04-27 ENCOUNTER — Other Ambulatory Visit: Payer: Self-pay | Admitting: Family Medicine

## 2016-04-27 DIAGNOSIS — I10 Essential (primary) hypertension: Secondary | ICD-10-CM | POA: Diagnosis not present

## 2016-04-27 DIAGNOSIS — M419 Scoliosis, unspecified: Secondary | ICD-10-CM | POA: Diagnosis not present

## 2016-04-27 DIAGNOSIS — J309 Allergic rhinitis, unspecified: Secondary | ICD-10-CM

## 2016-04-27 DIAGNOSIS — L089 Local infection of the skin and subcutaneous tissue, unspecified: Secondary | ICD-10-CM | POA: Diagnosis not present

## 2016-04-27 DIAGNOSIS — G894 Chronic pain syndrome: Secondary | ICD-10-CM | POA: Diagnosis not present

## 2016-04-27 DIAGNOSIS — M25562 Pain in left knee: Secondary | ICD-10-CM | POA: Diagnosis not present

## 2016-04-27 DIAGNOSIS — M5136 Other intervertebral disc degeneration, lumbar region: Secondary | ICD-10-CM | POA: Diagnosis not present

## 2016-04-27 NOTE — Telephone Encounter (Signed)
Please review-aa 

## 2016-05-03 ENCOUNTER — Emergency Department (HOSPITAL_COMMUNITY)
Admission: EM | Admit: 2016-05-03 | Discharge: 2016-05-03 | Disposition: A | Discharge status: Home / Self Care | Payer: Commercial Managed Care - HMO | Attending: Emergency Medicine | Admitting: Emergency Medicine

## 2016-05-03 ENCOUNTER — Emergency Department (HOSPITAL_COMMUNITY): Payer: Commercial Managed Care - HMO

## 2016-05-03 ENCOUNTER — Encounter (HOSPITAL_COMMUNITY): Payer: Self-pay | Admitting: *Deleted

## 2016-05-03 DIAGNOSIS — Z950 Presence of cardiac pacemaker: Secondary | ICD-10-CM | POA: Diagnosis not present

## 2016-05-03 DIAGNOSIS — J449 Chronic obstructive pulmonary disease, unspecified: Secondary | ICD-10-CM | POA: Insufficient documentation

## 2016-05-03 DIAGNOSIS — Z96651 Presence of right artificial knee joint: Secondary | ICD-10-CM | POA: Insufficient documentation

## 2016-05-03 DIAGNOSIS — R040 Epistaxis: Secondary | ICD-10-CM | POA: Insufficient documentation

## 2016-05-03 DIAGNOSIS — Z8673 Personal history of transient ischemic attack (TIA), and cerebral infarction without residual deficits: Secondary | ICD-10-CM | POA: Insufficient documentation

## 2016-05-03 DIAGNOSIS — I10 Essential (primary) hypertension: Secondary | ICD-10-CM | POA: Insufficient documentation

## 2016-05-03 DIAGNOSIS — Z7902 Long term (current) use of antithrombotics/antiplatelets: Secondary | ICD-10-CM | POA: Insufficient documentation

## 2016-05-03 DIAGNOSIS — Z79899 Other long term (current) drug therapy: Secondary | ICD-10-CM | POA: Insufficient documentation

## 2016-05-03 DIAGNOSIS — R05 Cough: Secondary | ICD-10-CM | POA: Diagnosis not present

## 2016-05-03 LAB — CBC WITH DIFFERENTIAL/PLATELET
BASOS ABS: 0 10*3/uL (ref 0.0–0.1)
Basophils Relative: 0 %
Eosinophils Absolute: 0.2 10*3/uL (ref 0.0–0.7)
Eosinophils Relative: 2 %
HEMATOCRIT: 40.8 % (ref 36.0–46.0)
Hemoglobin: 13.7 g/dL (ref 12.0–15.0)
LYMPHS ABS: 3.3 10*3/uL (ref 0.7–4.0)
LYMPHS PCT: 37 %
MCH: 29.7 pg (ref 26.0–34.0)
MCHC: 33.6 g/dL (ref 30.0–36.0)
MCV: 88.5 fL (ref 78.0–100.0)
Monocytes Absolute: 0.8 10*3/uL (ref 0.1–1.0)
Monocytes Relative: 9 %
NEUTROS ABS: 4.6 10*3/uL (ref 1.7–7.7)
Neutrophils Relative %: 52 %
Platelets: 259 10*3/uL (ref 150–400)
RBC: 4.61 MIL/uL (ref 3.87–5.11)
RDW: 12.9 % (ref 11.5–15.5)
WBC: 8.9 10*3/uL (ref 4.0–10.5)

## 2016-05-03 LAB — BASIC METABOLIC PANEL
ANION GAP: 8 (ref 5–15)
BUN: 9 mg/dL (ref 6–20)
CHLORIDE: 97 mmol/L — AB (ref 101–111)
CO2: 29 mmol/L (ref 22–32)
Calcium: 9.3 mg/dL (ref 8.9–10.3)
Creatinine, Ser: 0.91 mg/dL (ref 0.44–1.00)
GFR calc Af Amer: >60 mL/min (ref >60)
GFR, EST NON AFRICAN AMERICAN: 58 mL/min — AB (ref >60)
GLUCOSE: 100 mg/dL — AB (ref 65–99)
POTASSIUM: 3.9 mmol/L (ref 3.5–5.1)
Sodium: 134 mmol/L — ABNORMAL LOW (ref 135–145)

## 2016-05-03 LAB — PROTIME-INR
INR: 1.04
Prothrombin Time: 13.7 seconds (ref 11.4–15.2)

## 2016-05-03 MED ORDER — SALINE SPRAY 0.65 % NA SOLN
1.0000 | NASAL | 0 refills | Status: AC | PRN
Start: 2016-05-03 — End: ?

## 2016-05-03 MED ORDER — OXYMETAZOLINE HCL 0.05 % NA SOLN
1.0000 | Freq: Once | NASAL | Status: AC
  Administered 2016-05-03: 1 via NASAL
  Filled 2016-05-03: qty 15

## 2016-05-03 NOTE — ED Notes (Signed)
Called pt to be reassessed 3x, no response.

## 2016-05-03 NOTE — Discharge Instructions (Signed)
Try intranasal saline. If bleeding starts again, pinch her nose and hold for 10 minutes. Follow-up with ear nose throat if continued to have a nasal bleed. Return to emergency department if severe bleeding that you're unable to stop.

## 2016-05-03 NOTE — ED Notes (Signed)
Called pt to be reassessed with no response.  

## 2016-05-03 NOTE — ED Provider Notes (Signed)
[facility]-EMERGENCY DEPT Provider Note   CSN: [id number] Arrival date & time: 05/03/16  1221     History   Chief Complaint Chief Complaint  Patient presents with  . Hemoptysis  . Epistaxis    HPI Diamond Miller is a 82 y.o. female.  HPI Diamond Miller is a 82 y.o. female with multiple medical problems, who presents to emergency department complaining of nosebleed and hemoptysis. Patient states that she noticed that her nose was bleeding 2 days ago. Bleeding is coming from the right nostril. She reports that she started coughing up small amounts of bright red blood shortly after that. She has been coughing up blood since then. She denies any chest pain. No shortness of breath. Denies any sinus pain or pressure. No facial pain. She states she has chronic postnasal drainage for which she takes Zyrtec. Patient is on Plavix. She states she uses Q-tips to help to stop the bleeding. No hx of the same.   Past Medical History:  Diagnosis Date  . Acid reflux   . Anemia 1957  . Anxiety   . Chronic back pain   . COPD (chronic obstructive pulmonary disease) (HCC)   . Degenerative disc disease, cervical   . Dysrhythmia    "skips" (06/12/2013)  . Exertional shortness of breath    "recently" (06/12/2013)  . High cholesterol   . History of blood transfusion    "think it was when I had knee OR" (06/12/2013)  . Hypertension   . Liver cirrhosis (HCC)   . Pacemaker, Medtronic placed 06/01/13 06/11/2013  . Scoliosis   . Stroke (HCC) 05/14/2013   denies residual on 06/12/2013    Patient Active Problem List   Diagnosis Date Noted  . Adaptation reaction 04/08/2015  . Allergic rhinitis 04/08/2015  . Blocked tear duct 04/08/2015  . CN (constipation) 04/08/2015  . Cerebral infarct (HCC) 04/08/2015  . Ear ache 04/08/2015  . Edema extremities 04/08/2015  . Fall 04/08/2015  . Fatigue 04/08/2015  . Coccygeal fracture (HCC) 04/08/2015  . Acid reflux 04/08/2015  . HLD (hyperlipidemia)  04/08/2015  . Hypomagnesemia 04/08/2015  . Below normal amount of sodium in the blood 04/08/2015  . Cannot sleep 04/08/2015  . Arthritis, degenerative 04/08/2015  . Osteopenia 04/08/2015  . Pleurisy 04/08/2015  . Cirrhosis, primary biliary 04/08/2015  . Bilateral leg edema 12/24/2013  . Pacemaker, Medtronic placed 06/11/13 06/11/2013  . Tachy-brady syndrome (HCC) 05/27/2013  . Paroxysmal atrial fibrillation, has not been felt to be anticoagulation candidate secondary to freq falls and biliary cirhosis   05/27/2013  . Sinus pause 05/27/2013  . Elevated troponin- negative Myoview, Nl LVF 05/24/2013  . Demand ischemia (HCC) 05/19/2013  . Stroke Lt brain-05/14/13- probably embolic 05/14/2013  . Altered mental status 05/14/2013  . LEG EDEMA, BILATERAL 09/07/2010  . NONSPEC ELEVATION OF LEVELS OF TRANSAMINASE/LDH 03/19/2010  . CHEST WALL PAIN, ANTERIOR 02/26/2009  . UNSPECIFIED IRON DEFICIENCY ANEMIA 08/25/2008  . Hypertension 08/25/2008  . Esophageal reflux 08/25/2008  . NAUSEA ALONE 08/25/2008    Past Surgical History:  Procedure Laterality Date  . CATARACT EXTRACTION W/ INTRAOCULAR LENS  IMPLANT, BILATERAL Bilateral 2006-2007  . INSERT / REPLACE / REMOVE PACEMAKER  06/11/2013  . JOINT REPLACEMENT    . PACEMAKER INSERTION  06/01/13   Dr. Royann Shivers Medtronic device  . PERMANENT PACEMAKER INSERTION N/A 06/11/2013   Procedure: PERMANENT PACEMAKER INSERTION;  Surgeon: Thurmon Fair, MD;  Location: [facility] CATH LAB;  Service: Cardiovascular;  Laterality: N/A;  . REPLACEMENT TOTAL KNEE Right  2010  . TEE WITHOUT CARDIOVERSION N/A 05/17/2013   Procedure: TRANSESOPHAGEAL ECHOCARDIOGRAM (TEE);  Surgeon: Vesta MixerPhilip J Nahser, MD;  [facility];  Service: Cardiovascular;  Laterality: N/A;  . TONSILLECTOMY  ~ 1961    OB History    No data available       Home Medications    Prior to Admission medications   Medication Sig Start Date End Date Taking? Authorizing Provider  atorvastatin  (LIPITOR) 40 MG tablet TAKE 1 TABLET (40 MG TOTAL) BY MOUTH DAILY AT 6 PM. 12/31/15  Yes Mihai Croitoru, MD  cetirizine (ZYRTEC) 10 MG tablet TAKE 1 TABLET BY MOUTH DAILY. 04/27/16  Yes Malva Limesonald E Fisher, MD  clopidogrel (PLAVIX) 75 MG tablet TAKE 1 TABLET (75 MG TOTAL) BY MOUTH DAILY. 12/23/15  Yes Mihai Croitoru, MD  diazepam (VALIUM) 5 MG tablet Take 0.5 tablets (2.5 mg total) by mouth every 12 (twelve) hours as needed for anxiety. 10/23/15  Yes Lorie PhenixNancy Maloney, MD  glucosamine-chondroitin 500-400 MG tablet Take 1 tablet by mouth 3 (three) times daily.    Yes Historical Provider, MD  ketoconazole (NIZORAL) 2 % cream Apply 1 application topically daily. 01/16/16  Yes Ethelda ChickKristi M Smith, MD  magnesium oxide (MAG-OX) 400 MG tablet Take 400 mg by mouth daily.     Yes Historical Provider, MD  metoprolol succinate (TOPROL-XL) 25 MG 24 hr tablet Take 3 tablets (75 mg total) by mouth daily. Take with or immediately following a meal. 06/30/15  Yes Mihai Croitoru, MD  Multiple Vitamin (MULITIVITAMIN WITH MINERALS) TABS Take 1 tablet by mouth daily.   Yes Historical Provider, MD  omeprazole (PRILOSEC) 40 MG capsule Take 1 capsule (40 mg total) by mouth daily. 09/28/15  Yes Lorie PhenixNancy Maloney, MD  Ascorbic Acid (VITAMIN C) 1000 MG tablet Take 1,000 mg by mouth daily. Reported on 05/11/2015    Historical Provider, MD  furosemide (LASIX) 40 MG tablet TAKE 1 TABLET (40 MG TOTAL) BY MOUTH DAILY AS NEEDED. 04/26/16   Mihai Croitoru, MD  oxycodone (OXY-IR) 5 MG capsule  01/21/16   Historical Provider, MD  spironolactone (ALDACTONE) 25 MG tablet Take 1 tablet (25 mg total) by mouth daily with breakfast. 12/30/14   Mihai Croitoru, MD  traZODone (DESYREL) 150 MG tablet TAKE 1/2 TABLET BY MOUTH DAILY. 08/10/15   Lorie PhenixNancy Maloney, MD    Family History Family History  Problem Relation Age of Onset  . Heart disease Mother   . Hypertension Sister   . Dementia Brother 1960    Social History Social History  Substance Use Topics  . Smoking status:  Never Smoker  . Smokeless tobacco: Never Used  . Alcohol use No     Allergies   Aspirin; Tylenol [acetaminophen]; and Penicillins   Review of Systems Review of Systems  Constitutional: Negative for chills and fever.  HENT: Positive for congestion and nosebleeds. Negative for facial swelling and sore throat.   Respiratory: Positive for cough. Negative for chest tightness and shortness of breath.   Cardiovascular: Negative for chest pain, palpitations and leg swelling.  Gastrointestinal: Negative for abdominal pain, diarrhea, nausea and vomiting.  Genitourinary: Negative for dysuria, flank pain, pelvic pain, vaginal bleeding, vaginal discharge and vaginal pain.  Musculoskeletal: Negative for arthralgias, myalgias, neck pain and neck stiffness.  Skin: Negative for rash.  Neurological: Negative for dizziness, weakness and headaches.  All other systems reviewed and are negative.    Physical Exam Updated Vital Signs BP 125/83 (BP Location: Right Arm)   Pulse 98   Temp  97.6 F (36.4 C) (Oral)   Resp 20   Ht 4\' 11"  (1.499 m)   Wt 68 kg   SpO2 95%   BMI 30.30 kg/m   Physical Exam  Constitutional: She appears well-developed and well-nourished. No distress.  HENT:  Head: Normocephalic.  Dried up blood in the right near. No postnasal bleeding.  Eyes: Conjunctivae are normal.  Neck: Neck supple.  Cardiovascular: Normal rate, regular rhythm and normal heart sounds.   Pulmonary/Chest: Effort normal and breath sounds normal. No respiratory distress. She has no wheezes. She has no rales.  Patient is coughing up clear mucus with small amounts of red blood.  Abdominal: Soft. Bowel sounds are normal. She exhibits no distension. There is no tenderness. There is no rebound.  Musculoskeletal: She exhibits no edema.  Neurological: She is alert.  Skin: Skin is warm and dry.  Psychiatric: She has a normal mood and affect. Her behavior is normal.  Nursing note and vitals reviewed.    ED  Treatments / Results  Labs (all labs ordered are listed, but only abnormal results are displayed) Labs Reviewed  BASIC METABOLIC PANEL - Abnormal; Notable for the following:       Result Value   Sodium 134 (*)    Chloride 97 (*)    Glucose, Bld 100 (*)    GFR calc non Af Amer 58 (*)    All other components within normal limits  CBC WITH DIFFERENTIAL/PLATELET  PROTIME-INR    EKG  EKG Interpretation None       Radiology Dg Chest 2 View  Result Date: 05/03/2016 CLINICAL DATA:  Cough, congestion for a few days EXAM: CHEST  2 VIEW COMPARISON:  06/12/2013 FINDINGS: There is no focal parenchymal opacity. There is no pleural effusion or pneumothorax. The heart and mediastinal contours are unremarkable. There is a dual lead cardiac pacemaker. The osseous structures are unremarkable. IMPRESSION: No active cardiopulmonary disease. Electronically Signed   By: Elige KoHetal  Patel   On: 05/03/2016 13:39    Procedures Procedures (including critical care time)  Medications Ordered in ED Medications  oxymetazoline (AFRIN) 0.05 % nasal spray 1 spray (not administered)     Initial Impression / Assessment and Plan / ED Course  I have reviewed the triage vital signs and the nursing notes.  Pertinent labs & imaging results that were available during my care of the patient were reviewed by me and considered in my medical decision making (see chart for details).  Clinical Course   Patient emergency department with hemoptysis and right nasal bleed. No bleeding at this time. Will check labs, chest x-ray, will try Afrin.   No bleeding in ED. VS stable. H&H normal. CXR normal. Suspect coughing up blood from epistaxis. No CP, no SOB, no fever. Will dc home with nasal saline and follow up with ent.   Vitals:   05/03/16 1800 05/03/16 1815 05/03/16 1830 05/03/16 1845  BP: 118/77 122/59 109/75 113/73  Pulse: (!) 50 83 (!) 50 63  Resp:      Temp:      TempSrc:      SpO2: 100% 99% 97% 97%  Weight:        Height:        Final Clinical Impressions(s) / ED Diagnoses   Final diagnoses:  Epistaxis    New Prescriptions Discharge Medication List as of 05/03/2016  7:10 PM    START taking these medications   Details  sodium chloride (OCEAN) 0.65 % SOLN nasal spray Place 1  spray into both nostrils as needed for congestion., Starting Tue 05/03/2016, Print         Jaynie Crumble, PA-C 05/03/16 1610    Jacalyn Lefevre, MD 05/03/16 [phone]

## 2016-05-03 NOTE — ED Triage Notes (Signed)
Pt reports having a cough last week. On Sunday began coughing up blood. Denies night sweats or recent weight loss. Also having recent nosebleed from right nare. No acute distress is noted at triage.

## 2016-05-26 ENCOUNTER — Telehealth: Payer: Self-pay | Admitting: Cardiology

## 2016-05-26 ENCOUNTER — Ambulatory Visit (INDEPENDENT_AMBULATORY_CARE_PROVIDER_SITE_OTHER): Payer: Commercial Managed Care - HMO | Admitting: *Deleted

## 2016-05-26 DIAGNOSIS — I495 Sick sinus syndrome: Secondary | ICD-10-CM

## 2016-05-26 NOTE — Progress Notes (Signed)
Remote pacemaker transmission.   

## 2016-05-26 NOTE — Telephone Encounter (Signed)
Spoke with pt and reminded pt of remote transmission that is due today. Pt verbalized understanding.   

## 2016-05-27 ENCOUNTER — Encounter: Payer: Self-pay | Admitting: Cardiology

## 2016-06-10 DIAGNOSIS — M5136 Other intervertebral disc degeneration, lumbar region: Secondary | ICD-10-CM | POA: Diagnosis not present

## 2016-06-10 DIAGNOSIS — G894 Chronic pain syndrome: Secondary | ICD-10-CM | POA: Diagnosis not present

## 2016-06-10 DIAGNOSIS — Z79891 Long term (current) use of opiate analgesic: Secondary | ICD-10-CM | POA: Diagnosis not present

## 2016-06-14 LAB — CUP PACEART REMOTE DEVICE CHECK
Battery Remaining Longevity: 93 mo
Battery Voltage: 3.01 V
Brady Statistic RV Percent Paced: 3.93 %
Date Time Interrogation Session: 20180104202859
Implantable Lead Implant Date: 20150120
Implantable Lead Location: 753859
Implantable Lead Location: 753860
Implantable Lead Model: 5076
Implantable Pulse Generator Implant Date: 20150120
Lead Channel Impedance Value: 627 Ohm
Lead Channel Pacing Threshold Amplitude: 0.5 V
Lead Channel Pacing Threshold Pulse Width: 0.4 ms
Lead Channel Sensing Intrinsic Amplitude: 2.5 mV
Lead Channel Setting Pacing Amplitude: 2 V
Lead Channel Setting Sensing Sensitivity: 2.8 mV
MDC IDC LEAD IMPLANT DT: 20150120
MDC IDC MSMT LEADCHNL RA IMPEDANCE VALUE: 418 Ohm
MDC IDC MSMT LEADCHNL RA IMPEDANCE VALUE: 551 Ohm
MDC IDC MSMT LEADCHNL RA SENSING INTR AMPL: 2.5 mV
MDC IDC MSMT LEADCHNL RV IMPEDANCE VALUE: 570 Ohm
MDC IDC MSMT LEADCHNL RV PACING THRESHOLD AMPLITUDE: 1 V
MDC IDC MSMT LEADCHNL RV PACING THRESHOLD PULSEWIDTH: 0.4 ms
MDC IDC MSMT LEADCHNL RV SENSING INTR AMPL: 17.25 mV
MDC IDC MSMT LEADCHNL RV SENSING INTR AMPL: 17.25 mV
MDC IDC SET LEADCHNL RA PACING AMPLITUDE: 1.5 V
MDC IDC SET LEADCHNL RV PACING PULSEWIDTH: 0.4 ms
MDC IDC STAT BRADY AP VP PERCENT: 0.09 %
MDC IDC STAT BRADY AP VS PERCENT: 58.41 %
MDC IDC STAT BRADY AS VP PERCENT: 4.02 %
MDC IDC STAT BRADY AS VS PERCENT: 37.48 %
MDC IDC STAT BRADY RA PERCENT PACED: 53.64 %

## 2016-06-20 DIAGNOSIS — H029 Unspecified disorder of eyelid: Secondary | ICD-10-CM | POA: Diagnosis not present

## 2016-06-20 DIAGNOSIS — F419 Anxiety disorder, unspecified: Secondary | ICD-10-CM | POA: Diagnosis not present

## 2016-06-20 DIAGNOSIS — Z79899 Other long term (current) drug therapy: Secondary | ICD-10-CM | POA: Diagnosis not present

## 2016-06-20 DIAGNOSIS — K7460 Unspecified cirrhosis of liver: Secondary | ICD-10-CM | POA: Diagnosis not present

## 2016-06-20 DIAGNOSIS — K219 Gastro-esophageal reflux disease without esophagitis: Secondary | ICD-10-CM | POA: Diagnosis not present

## 2016-06-20 DIAGNOSIS — E871 Hypo-osmolality and hyponatremia: Secondary | ICD-10-CM | POA: Diagnosis not present

## 2016-06-20 DIAGNOSIS — I495 Sick sinus syndrome: Secondary | ICD-10-CM | POA: Diagnosis not present

## 2016-06-20 DIAGNOSIS — I2510 Atherosclerotic heart disease of native coronary artery without angina pectoris: Secondary | ICD-10-CM | POA: Diagnosis not present

## 2016-06-20 DIAGNOSIS — G894 Chronic pain syndrome: Secondary | ICD-10-CM | POA: Diagnosis not present

## 2016-06-21 ENCOUNTER — Other Ambulatory Visit: Payer: Self-pay | Admitting: Cardiovascular Disease

## 2016-06-21 ENCOUNTER — Telehealth: Payer: Self-pay | Admitting: Cardiovascular Disease

## 2016-06-21 NOTE — Telephone Encounter (Signed)
Did not not encounter

## 2016-06-21 NOTE — Telephone Encounter (Signed)
Rx(s) sent to pharmacy electronically.  

## 2016-06-29 ENCOUNTER — Ambulatory Visit (INDEPENDENT_AMBULATORY_CARE_PROVIDER_SITE_OTHER): Payer: Medicare HMO | Admitting: Cardiovascular Disease

## 2016-06-29 ENCOUNTER — Encounter: Payer: Self-pay | Admitting: Cardiovascular Disease

## 2016-06-29 VITALS — BP 116/65 | HR 60 | Ht 59.0 in | Wt 149.0 lb

## 2016-06-29 DIAGNOSIS — I480 Paroxysmal atrial fibrillation: Secondary | ICD-10-CM | POA: Diagnosis not present

## 2016-06-29 DIAGNOSIS — Z950 Presence of cardiac pacemaker: Secondary | ICD-10-CM

## 2016-06-29 DIAGNOSIS — I10 Essential (primary) hypertension: Secondary | ICD-10-CM

## 2016-06-29 DIAGNOSIS — I495 Sick sinus syndrome: Secondary | ICD-10-CM

## 2016-06-29 DIAGNOSIS — E7800 Pure hypercholesterolemia, unspecified: Secondary | ICD-10-CM

## 2016-06-29 LAB — CUP PACEART INCLINIC DEVICE CHECK
Brady Statistic AP VP Percent: 0.07 %
Brady Statistic AP VS Percent: 63.36 %
Brady Statistic AS VP Percent: 2.74 %
Date Time Interrogation Session: 20180207125104
Implantable Lead Implant Date: 20150120
Implantable Lead Implant Date: 20150120
Implantable Lead Location: 753860
Implantable Lead Model: 5076
Lead Channel Impedance Value: 532 Ohm
Lead Channel Pacing Threshold Pulse Width: 0.4 ms
Lead Channel Sensing Intrinsic Amplitude: 15.5 mV
Lead Channel Sensing Intrinsic Amplitude: 20 mV
Lead Channel Sensing Intrinsic Amplitude: 3 mV
Lead Channel Setting Pacing Amplitude: 1.5 V
Lead Channel Setting Sensing Sensitivity: 2.8 mV
MDC IDC LEAD LOCATION: 753859
MDC IDC MSMT BATTERY REMAINING LONGEVITY: 89 mo
MDC IDC MSMT BATTERY VOLTAGE: 3.02 V
MDC IDC MSMT LEADCHNL RA IMPEDANCE VALUE: 418 Ohm
MDC IDC MSMT LEADCHNL RA PACING THRESHOLD AMPLITUDE: 0.5 V
MDC IDC MSMT LEADCHNL RA PACING THRESHOLD PULSEWIDTH: 0.4 ms
MDC IDC MSMT LEADCHNL RA SENSING INTR AMPL: 2.375 mV
MDC IDC MSMT LEADCHNL RV IMPEDANCE VALUE: 608 Ohm
MDC IDC MSMT LEADCHNL RV IMPEDANCE VALUE: 646 Ohm
MDC IDC MSMT LEADCHNL RV PACING THRESHOLD AMPLITUDE: 0.875 V
MDC IDC PG IMPLANT DT: 20150120
MDC IDC SET LEADCHNL RV PACING AMPLITUDE: 2 V
MDC IDC SET LEADCHNL RV PACING PULSEWIDTH: 0.4 ms
MDC IDC STAT BRADY AS VS PERCENT: 33.82 %
MDC IDC STAT BRADY RA PERCENT PACED: 59.55 %
MDC IDC STAT BRADY RV PERCENT PACED: 2.71 %

## 2016-06-29 LAB — PACEMAKER DEVICE OBSERVATION

## 2016-06-29 NOTE — Progress Notes (Signed)
Patient ID: Diamond Miller, female   DOB: 24-Sep-1932, 83 y.o.   MRN: [id number]    Cardiology Office Note    Date:  06/29/2016   ID:  Diamond Miller, DOB [birth year], MRN [id number]  PCP:  Redmond Baseman, MD  Cardiologist:   Thurmon Fair, MD   Chief Complaint  Patient presents with  . Follow-up    History of Present Illness:  Diamond Miller is a 83 y.o. female with a history of tachycardia-bradycardia syndrome (sinus bradycardia, paroxysmal atrial fibrillation and atrial flutter) for which received a dual-chamber permanent pacemaker in 2015. Despite high embolic risk (presumed embolic stroke in December 2014) she is not receiving anticoagulation since she has had numerous falls with serious injury and has biliary cirrhosis. Thankfully, she has not had any severe bleeding problems and denies any new neurological complaints.  She did wake up with epistaxis one morning and went to the emergency room. Conservative management was recommended.  Pacemaker interrogation shows normal device function. Her Medtronic Advisa dual-chamber pacemaker was implanted in 2015 and has roughly 7-8 years of remaining longevity. She has 63% atrial pacing and only 3% ventricular pacing. Since her last device check there has been 11% Atrial mode switch, both atrial flutter and atrial fibrillation. Ventricular rate control is satisfactory, although not perfect. He episodes of arrhythmia occur randomly, unpredictably and appeared to be asymptomatic  She does not appear to be aware of palpitations. She denies syncope, focal neurological events or bleeding problems. She continues to be very unsteady on her feet although she has not had serious falls. He uses a walker and would like to get one with a seat. She takes furosemide every day and occasionally takes an extra dose for worsening swelling.   Recently perform labs at [facility] show normal BUN and creatinine and electrolytes, elevated alkaline phosphatase  at 347 (comparable with her trend over the last year or two) and minimally elevated AST at 61, normal ALT at 42, bilirubin and albumin.   Past Medical History:  Diagnosis Date  . Acid reflux   . Anemia 1957  . Anxiety   . Chronic back pain   . COPD (chronic obstructive pulmonary disease) (HCC)   . Degenerative disc disease, cervical   . Dysrhythmia    "skips" (06/12/2013)  . Exertional shortness of breath    "recently" (06/12/2013)  . High cholesterol   . History of blood transfusion    "think it was when I had knee OR" (06/12/2013)  . Hypertension   . Liver cirrhosis (HCC)   . Pacemaker, Medtronic placed 06/01/13 06/11/2013  . Scoliosis   . Stroke (HCC) 05/14/2013   denies residual on 06/12/2013    Past Surgical History:  Procedure Laterality Date  . CATARACT EXTRACTION W/ INTRAOCULAR LENS  IMPLANT, BILATERAL Bilateral 2006-2007  . INSERT / REPLACE / REMOVE PACEMAKER  06/11/2013  . JOINT REPLACEMENT    . PACEMAKER INSERTION  06/01/13   Dr. Royann Shivers Medtronic device  . PERMANENT PACEMAKER INSERTION N/A 06/11/2013   Procedure: PERMANENT PACEMAKER INSERTION;  Surgeon: Thurmon Fair, MD;  Location: [facility] CATH LAB;  Service: Cardiovascular;  Laterality: N/A;  . REPLACEMENT TOTAL KNEE Right 2010  . TEE WITHOUT CARDIOVERSION N/A 05/17/2013   Procedure: TRANSESOPHAGEAL ECHOCARDIOGRAM (TEE);  Surgeon: Vesta Mixer, MD;  Location: [facility] ENDOSCOPY;  Service: Cardiovascular;  Laterality: N/A;  . TONSILLECTOMY  ~ 1961    Outpatient Medications Prior to Visit  Medication Sig Dispense Refill  . Ascorbic Acid (VITAMIN C) 1000 MG  tablet Take 1,000 mg by mouth daily. Reported on 05/11/2015    . atorvastatin (LIPITOR) 40 MG tablet TAKE 1 TABLET (40 MG TOTAL) BY MOUTH DAILY AT 6 PM. 90 tablet 3  . cetirizine (ZYRTEC) 10 MG tablet TAKE 1 TABLET BY MOUTH DAILY. 90 tablet 3  . clopidogrel (PLAVIX) 75 MG tablet TAKE 1 TABLET (75 MG TOTAL) BY MOUTH DAILY. 90 tablet 3  . diazepam (VALIUM) 5 MG tablet  Take 0.5 tablets (2.5 mg total) by mouth every 12 (twelve) hours as needed for anxiety. 30 tablet 5  . furosemide (LASIX) 40 MG tablet TAKE 1 TABLET (40 MG TOTAL) BY MOUTH DAILY AS NEEDED. (Patient taking differently: TAKE 1 TABLET (40 MG TOTAL) BY MOUTH DAILY) 90 tablet 2  . ketoconazole (NIZORAL) 2 % cream Apply 1 application topically daily. 30 g 0  . magnesium oxide (MAG-OX) 400 MG tablet Take 400 mg by mouth daily.      . metoprolol succinate (TOPROL-XL) 25 MG 24 hr tablet TAKE 3 TABLETS (75 MG TOTAL) BY MOUTH DAILY. TAKE WITH OR IMMEDIATELY FOLLOWING A MEAL. 270 tablet 1  . Multiple Vitamin (MULITIVITAMIN WITH MINERALS) TABS Take 1 tablet by mouth daily.    Marland Kitchen. omeprazole (PRILOSEC) 40 MG capsule Take 1 capsule (40 mg total) by mouth daily. 90 capsule 1  . oxycodone (OXY-IR) 5 MG capsule Take 5 mg by mouth every 6 (six) hours as needed for pain.     . sodium chloride (OCEAN) 0.65 % SOLN nasal spray Place 1 spray into both nostrils as needed for congestion. 1 Bottle 0  . traZODone (DESYREL) 150 MG tablet TAKE 1/2 TABLET BY MOUTH DAILY. (Patient taking differently: TAKE 1 TABLET BY MOUTH DAILY.) 30 tablet 11   No facility-administered medications prior to visit.      Allergies:   Aspirin; Tylenol [acetaminophen]; and Penicillins   Social History   Social History  . Marital status: Widowed    Spouse name: N/A  . Number of children: N/A  . Years of education: N/A   Social History Main Topics  . Smoking status: Never Smoker  . Smokeless tobacco: Never Used  . Alcohol use No  . Drug use: No  . Sexual activity: No   Other Topics Concern  . None   Social History Narrative  . None     Family History:  The patient's family history includes Dementia (age of onset: 3860) in her brother; Heart disease in her mother; Hypertension in her sister.   ROS:   Please see the history of present illness.    ROS All other systems reviewed and are negative.   PHYSICAL EXAM:   VS:  BP 116/65  (BP Location: Left Arm, Patient Position: Sitting, Cuff Size: Normal)   Pulse 60   Ht 4\' 11"  (1.499 m)   Wt 67.6 kg (149 lb)   BMI 30.09 kg/m    GEN: Well nourished, well developed, in no acute distress  HEENT: normal  Neck: no JVD, carotid bruits, or masses Cardiac: RRR; no murmurs, rubs, or gallops,no edema , healthy subclavian pacemaker site Respiratory:  clear to auscultation bilaterally, normal work of breathing GI: soft, nontender, nondistended, + BS MS: no deformity or atrophy  Skin: warm and dry, no rash Neuro:  Alert and Oriented x 3, Strength and sensation are intact Psych: euthymic mood, full affect  Wt Readings from Last 3 Encounters:  06/29/16 67.6 kg (149 lb)  05/03/16 68 kg (150 lb)  01/16/16 67.6 kg (149 lb)  Studies/Labs Reviewed:   EKG:  EKG is ordered today and shows atrial paced ventricular sensed rhythm, prolonged AV conduction time at 236 ms, otherwise normal, QTC 418 ms   Recent Labs: 12/30/2015: ALT 45 05/03/2016: BUN 9; Creatinine, Ser 0.91; Hemoglobin 13.7; Platelets 259; Potassium 3.9; Sodium 134   Lipid Panel    Component Value Date/Time   CHOL 168 05/15/2013 0155   TRIG 123 05/15/2013 0155   HDL 38 (L) 05/15/2013 0155   CHOLHDL 4.4 05/15/2013 0155   VLDL 25 05/15/2013 0155   LDLCALC 105 (H) 05/15/2013 0155     ASSESSMENT:    1. Tachy-brady syndrome (HCC)   2. Paroxysmal atrial fibrillation (HCC)   3. Essential hypertension   4. Pacemaker, Medtronic placed 06/11/13   5. Pure hypercholesterolemia      PLAN:  In order of problems listed above:  1. Sinus node dysfunction, asymptomatic after pacemaker implantation 2. PAF: episodes are asymptomatic. CHADSVasc score 6, but felt to be at very high risk for bleeding complications. Taking clopidogrel. Rate control is satisfactory after we increased metoprolol to 75 mg daily. Had what sounds like mild and self-limited epistaxis. I don't think this is a reason to stop her clopidogrel.  Recommended she was a humidifier in her bedroom, saline spray, thin layer of moisture barrier in her nares. 3. HTN: Good blood pressure control. 4. PPM: Normal pacemaker function. Check via [facility] every 3 months and office visits at least yearly 5. HLP: No sign of any side effects of statin, despite diagnosis of primary biliary cirrhosis. Alkaline phosphatase is chronically elevated, at a stable level.    Medication Adjustments/Labs and Tests Ordered: Current medicines are reviewed at length with the patient today.  Concerns regarding medicines are outlined above.  Medication changes, Labs and Tests ordered today are listed in the Patient Instructions below. Patient Instructions  Dr Royann Shivers recommends that you continue on your current medications as directed. Please refer to the Current Medication list given to you today.  Remote monitoring is used to monitor your Pacemaker of ICD from home. This monitoring reduces the number of office visits required to check your device to one time per year. It allows [place] to keep an eye on the functioning of your device to ensure it is working properly. You are scheduled for a device check from home on Wednesday, May 9th, 2018. You may send your transmission at any time that day. If you have a wireless device, the transmission will be sent automatically. After your physician reviews your transmission, you will receive a postcard with your next transmission date.  Dr Royann Shivers recommends that you schedule a follow-up appointment in 12 months with a pacemaker check. You will receive a reminder letter in the mail two months in advance. If you don't receive a letter, please call our office to schedule the follow-up appointment.  If you need a refill on your cardiac medications before your next appointment, please call your pharmacy.   Signed, Thurmon Fair, MD  06/29/2016 11:40 AM    [facility] [address] [place], [place], [place]   [address] Phone: [phone]; Fax: [phone]

## 2016-06-29 NOTE — Patient Instructions (Signed)
Dr Croitoru recommends that you continue on your current medications as directed. Please refer to the Current Medication list given to you today.  Remote monitoring is used to monitor your Pacemaker of ICD from home. This monitoring reduces the number of office visits required to check your device to one time per year. It allows us to keep an eye on the functioning of your device to ensure it is working properly. You are scheduled for a device check from home on Wednesday, May 9th, 2018. You may send your transmission at any time that day. If you have a wireless device, the transmission will be sent automatically. After your physician reviews your transmission, you will receive a postcard with your next transmission date.  Dr Croitoru recommends that you schedule a follow-up appointment in 12 months with a pacemaker check. You will receive a reminder letter in the mail two months in advance. If you don't receive a letter, please call our office to schedule the follow-up appointment.  If you need a refill on your cardiac medications before your next appointment, please call your pharmacy. 

## 2016-08-18 DIAGNOSIS — I2510 Atherosclerotic heart disease of native coronary artery without angina pectoris: Secondary | ICD-10-CM | POA: Diagnosis not present

## 2016-08-18 DIAGNOSIS — K7460 Unspecified cirrhosis of liver: Secondary | ICD-10-CM | POA: Diagnosis not present

## 2016-08-18 DIAGNOSIS — F419 Anxiety disorder, unspecified: Secondary | ICD-10-CM | POA: Diagnosis not present

## 2016-08-18 DIAGNOSIS — I495 Sick sinus syndrome: Secondary | ICD-10-CM | POA: Diagnosis not present

## 2016-08-18 DIAGNOSIS — E871 Hypo-osmolality and hyponatremia: Secondary | ICD-10-CM | POA: Diagnosis not present

## 2016-08-18 DIAGNOSIS — K219 Gastro-esophageal reflux disease without esophagitis: Secondary | ICD-10-CM | POA: Diagnosis not present

## 2016-08-18 DIAGNOSIS — G894 Chronic pain syndrome: Secondary | ICD-10-CM | POA: Diagnosis not present

## 2016-09-28 ENCOUNTER — Ambulatory Visit (INDEPENDENT_AMBULATORY_CARE_PROVIDER_SITE_OTHER): Payer: Medicare HMO | Admitting: *Deleted

## 2016-09-28 DIAGNOSIS — I495 Sick sinus syndrome: Secondary | ICD-10-CM | POA: Diagnosis not present

## 2016-09-29 DIAGNOSIS — G894 Chronic pain syndrome: Secondary | ICD-10-CM | POA: Diagnosis not present

## 2016-09-29 DIAGNOSIS — Z79891 Long term (current) use of opiate analgesic: Secondary | ICD-10-CM | POA: Diagnosis not present

## 2016-09-29 DIAGNOSIS — M5136 Other intervertebral disc degeneration, lumbar region: Secondary | ICD-10-CM | POA: Diagnosis not present

## 2016-09-29 NOTE — Progress Notes (Signed)
Remote pacemaker transmission.   

## 2016-09-30 ENCOUNTER — Encounter: Payer: Self-pay | Admitting: Cardiology

## 2016-09-30 LAB — CUP PACEART REMOTE DEVICE CHECK
Battery Voltage: 3.02 V
Brady Statistic AP VP Percent: 0.06 %
Brady Statistic AP VS Percent: 66.23 %
Brady Statistic AS VP Percent: 3.69 %
Brady Statistic AS VS Percent: 30.02 %
Brady Statistic RV Percent Paced: 3.64 %
Date Time Interrogation Session: 20180509205859
Implantable Lead Implant Date: 20150120
Implantable Lead Location: 753859
Implantable Lead Location: 753860
Implantable Lead Model: 5076
Implantable Lead Model: 5076
Lead Channel Impedance Value: 418 Ohm
Lead Channel Pacing Threshold Amplitude: 0.625 V
Lead Channel Pacing Threshold Pulse Width: 0.4 ms
Lead Channel Pacing Threshold Pulse Width: 0.4 ms
Lead Channel Sensing Intrinsic Amplitude: 2.25 mV
Lead Channel Sensing Intrinsic Amplitude: 2.25 mV
Lead Channel Setting Pacing Amplitude: 1.5 V
MDC IDC LEAD IMPLANT DT: 20150120
MDC IDC MSMT BATTERY REMAINING LONGEVITY: 87 mo
MDC IDC MSMT LEADCHNL RA IMPEDANCE VALUE: 532 Ohm
MDC IDC MSMT LEADCHNL RV IMPEDANCE VALUE: 532 Ohm
MDC IDC MSMT LEADCHNL RV IMPEDANCE VALUE: 570 Ohm
MDC IDC MSMT LEADCHNL RV PACING THRESHOLD AMPLITUDE: 1 V
MDC IDC MSMT LEADCHNL RV SENSING INTR AMPL: 16.625 mV
MDC IDC MSMT LEADCHNL RV SENSING INTR AMPL: 16.625 mV
MDC IDC PG IMPLANT DT: 20150120
MDC IDC SET LEADCHNL RV PACING AMPLITUDE: 2 V
MDC IDC SET LEADCHNL RV PACING PULSEWIDTH: 0.4 ms
MDC IDC SET LEADCHNL RV SENSING SENSITIVITY: 2.8 mV
MDC IDC STAT BRADY RA PERCENT PACED: 62.28 %

## 2016-10-01 IMAGING — DX DG TOE GREAT 2+V*R*
3 series · 3 of 3 positions shown · non-contrast
Comparison: None.

CLINICAL DATA: pain, swelling, redness R great toe

EXAM:
RIGHT GREAT TOE

[toe ap]
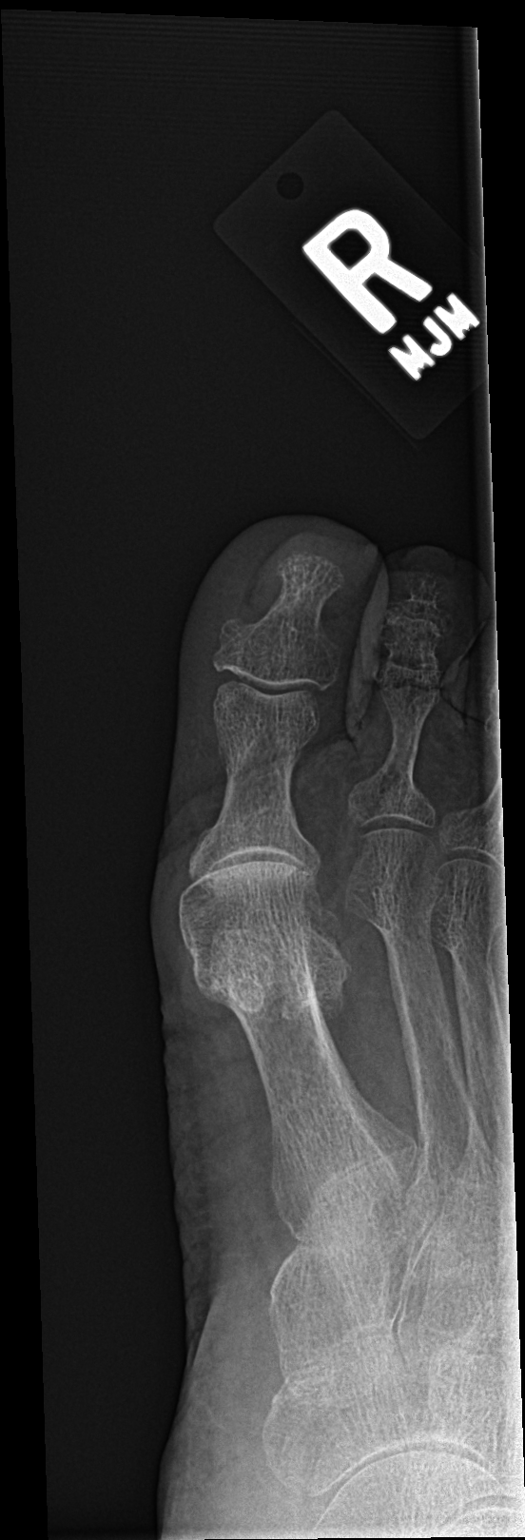

[toe obl]
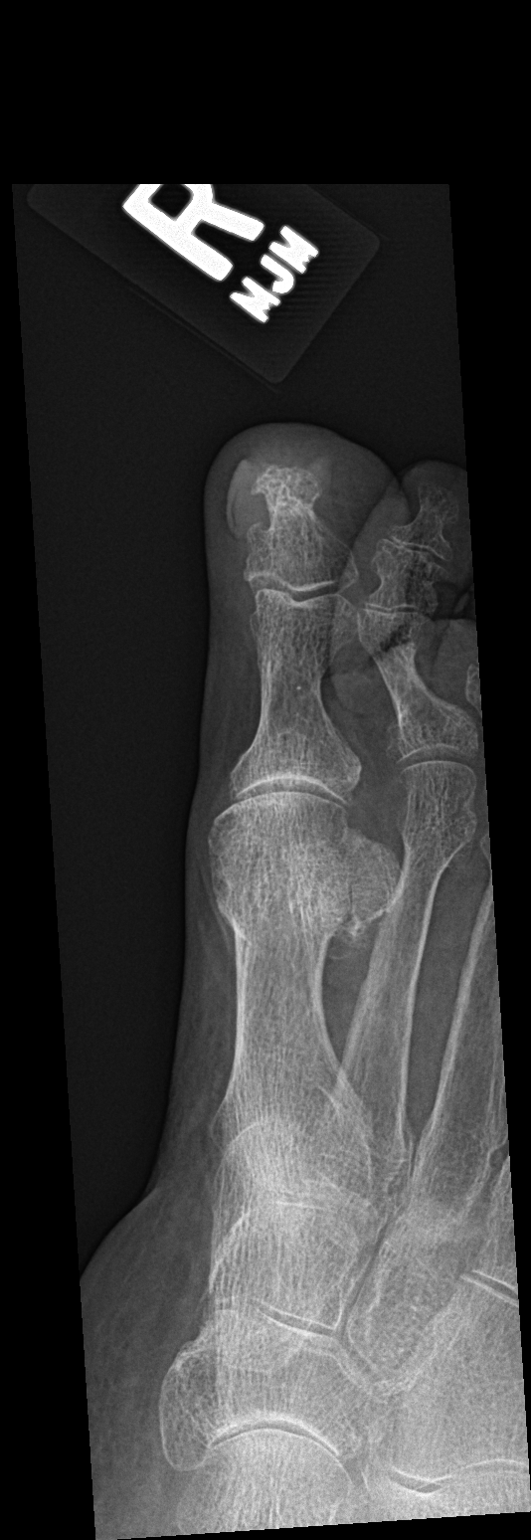

[toe lat]
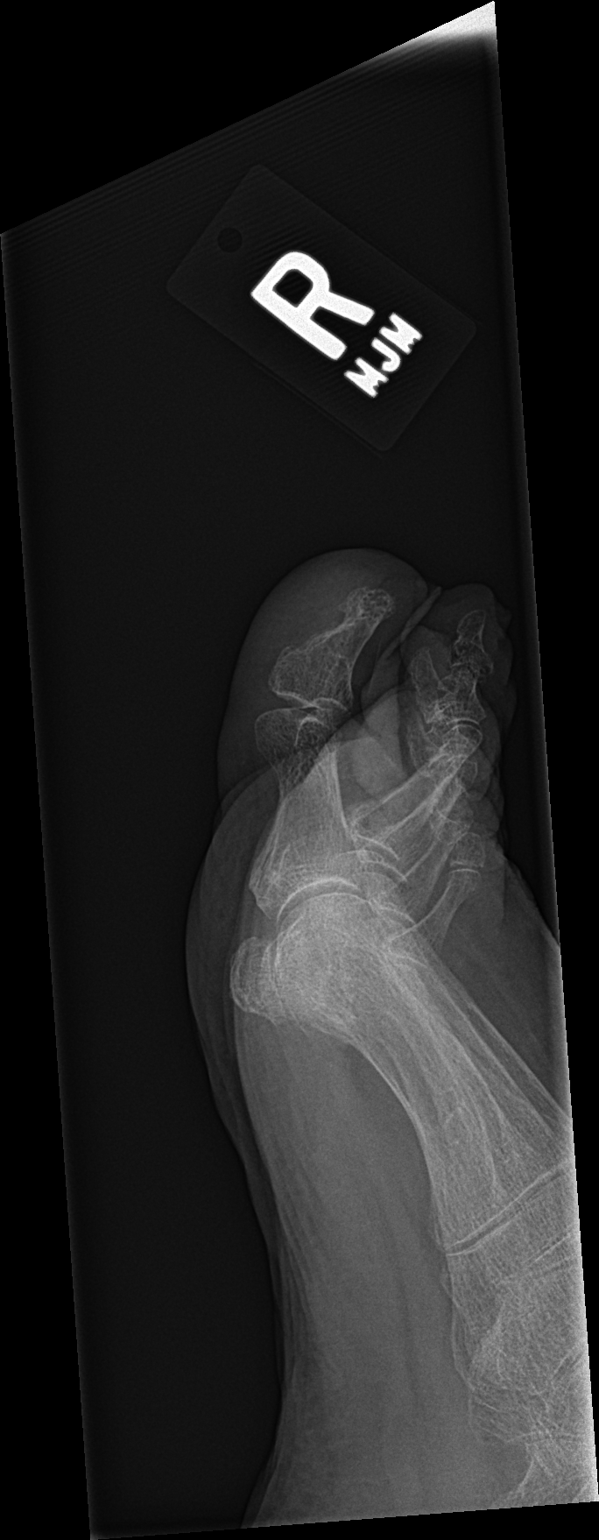

[3 of 3 positions shown; findings below may reference images not displayed]

FINDINGS: Mild diffuse osteopenia. No fracture, dislocation, cortical erosion,
or other acute abnormality. No radiodense foreign body. No
subcutaneous gas. No significant osseous degenerative change.
IMPRESSION: 1. Mild osteopenia.  No acute abnormality.

## 2016-10-18 DIAGNOSIS — G894 Chronic pain syndrome: Secondary | ICD-10-CM | POA: Diagnosis not present

## 2016-10-18 DIAGNOSIS — I2510 Atherosclerotic heart disease of native coronary artery without angina pectoris: Secondary | ICD-10-CM | POA: Diagnosis not present

## 2016-10-18 DIAGNOSIS — K7460 Unspecified cirrhosis of liver: Secondary | ICD-10-CM | POA: Diagnosis not present

## 2016-10-18 DIAGNOSIS — I495 Sick sinus syndrome: Secondary | ICD-10-CM | POA: Diagnosis not present

## 2016-10-18 DIAGNOSIS — F419 Anxiety disorder, unspecified: Secondary | ICD-10-CM | POA: Diagnosis not present

## 2016-10-18 DIAGNOSIS — K219 Gastro-esophageal reflux disease without esophagitis: Secondary | ICD-10-CM | POA: Diagnosis not present

## 2016-11-01 DIAGNOSIS — M5136 Other intervertebral disc degeneration, lumbar region: Secondary | ICD-10-CM | POA: Diagnosis not present

## 2016-12-12 ENCOUNTER — Other Ambulatory Visit: Payer: Self-pay | Admitting: Cardiovascular Disease

## 2016-12-15 ENCOUNTER — Telehealth: Payer: Self-pay | Admitting: Cardiovascular Disease

## 2016-12-15 DIAGNOSIS — G894 Chronic pain syndrome: Secondary | ICD-10-CM | POA: Diagnosis not present

## 2016-12-15 DIAGNOSIS — K219 Gastro-esophageal reflux disease without esophagitis: Secondary | ICD-10-CM | POA: Diagnosis not present

## 2016-12-15 DIAGNOSIS — F419 Anxiety disorder, unspecified: Secondary | ICD-10-CM | POA: Diagnosis not present

## 2016-12-15 DIAGNOSIS — I495 Sick sinus syndrome: Secondary | ICD-10-CM | POA: Diagnosis not present

## 2016-12-15 DIAGNOSIS — K7460 Unspecified cirrhosis of liver: Secondary | ICD-10-CM | POA: Diagnosis not present

## 2016-12-15 DIAGNOSIS — F5101 Primary insomnia: Secondary | ICD-10-CM | POA: Diagnosis not present

## 2016-12-15 DIAGNOSIS — I2510 Atherosclerotic heart disease of native coronary artery without angina pectoris: Secondary | ICD-10-CM | POA: Diagnosis not present

## 2016-12-15 MED ORDER — METOPROLOL SUCCINATE ER 50 MG PO TB24: ORAL_TABLET | ORAL | 5 refills | Status: DC

## 2016-12-15 NOTE — Telephone Encounter (Signed)
Pt calling requesting a refill on Metoprolol, which medication is at pt's pharmacy. I called the pharmacy and they stated that pt is going to need a prior auth because of the amount of tablets that pt is taking. Please address

## 2016-12-15 NOTE — Telephone Encounter (Signed)
If it makes them happier, can take one and a half of the 50 mg tabs. Seriously? MCr

## 2016-12-16 MED ORDER — METOPROLOL SUCCINATE ER 50 MG PO TB24: ORAL_TABLET | ORAL | 5 refills | Status: DC

## 2016-12-16 NOTE — Telephone Encounter (Signed)
Resent Rx to the correct pharmacy, as requested by pt. Confirmation received.

## 2016-12-16 NOTE — Addendum Note (Signed)
Addended by: Demetrios LollBARNARD, CATHY C on: 12/16/2016 09:30 AM   Modules accepted: Orders

## 2016-12-28 ENCOUNTER — Ambulatory Visit (INDEPENDENT_AMBULATORY_CARE_PROVIDER_SITE_OTHER): Payer: Medicare HMO | Admitting: *Deleted

## 2016-12-28 DIAGNOSIS — I495 Sick sinus syndrome: Secondary | ICD-10-CM

## 2016-12-29 ENCOUNTER — Encounter: Payer: Self-pay | Admitting: Cardiology

## 2016-12-29 NOTE — Progress Notes (Signed)
Remote pacemaker transmission.   

## 2016-12-30 DIAGNOSIS — M47816 Spondylosis without myelopathy or radiculopathy, lumbar region: Secondary | ICD-10-CM | POA: Diagnosis not present

## 2016-12-30 DIAGNOSIS — M5136 Other intervertebral disc degeneration, lumbar region: Secondary | ICD-10-CM | POA: Diagnosis not present

## 2017-01-10 ENCOUNTER — Other Ambulatory Visit: Payer: Self-pay | Admitting: Cardiovascular Disease

## 2017-01-17 LAB — CUP PACEART REMOTE DEVICE CHECK
Battery Remaining Longevity: 85 mo
Battery Voltage: 3.02 V
Brady Statistic AP VP Percent: 0.07 %
Brady Statistic AP VS Percent: 56.59 %
Brady Statistic AS VP Percent: 6.3 %
Brady Statistic AS VS Percent: 37.04 %
Brady Statistic RA Percent Paced: 50.45 %
Brady Statistic RV Percent Paced: 6.36 %
Date Time Interrogation Session: 20180808193328
Implantable Lead Implant Date: 20150120
Implantable Lead Implant Date: 20150120
Implantable Lead Location: 753859
Implantable Lead Location: 753860
Implantable Lead Model: 5076
Implantable Lead Model: 5076
Implantable Pulse Generator Implant Date: 20150120
Lead Channel Impedance Value: 437 Ohm
Lead Channel Impedance Value: 532 Ohm
Lead Channel Impedance Value: 608 Ohm
Lead Channel Impedance Value: 646 Ohm
Lead Channel Pacing Threshold Amplitude: 0.625 V
Lead Channel Pacing Threshold Amplitude: 0.75 V
Lead Channel Pacing Threshold Pulse Width: 0.4 ms
Lead Channel Pacing Threshold Pulse Width: 0.4 ms
Lead Channel Sensing Intrinsic Amplitude: 1 mV
Lead Channel Sensing Intrinsic Amplitude: 1 mV
Lead Channel Sensing Intrinsic Amplitude: 19 mV
Lead Channel Sensing Intrinsic Amplitude: 19 mV
Lead Channel Setting Pacing Amplitude: 1.5 V
Lead Channel Setting Pacing Amplitude: 2 V
Lead Channel Setting Pacing Pulse Width: 0.4 ms
Lead Channel Setting Sensing Sensitivity: 2.8 mV

## 2017-02-07 DIAGNOSIS — M47816 Spondylosis without myelopathy or radiculopathy, lumbar region: Secondary | ICD-10-CM | POA: Diagnosis not present

## 2017-02-27 DIAGNOSIS — K7469 Other cirrhosis of liver: Secondary | ICD-10-CM | POA: Diagnosis not present

## 2017-02-27 DIAGNOSIS — M47816 Spondylosis without myelopathy or radiculopathy, lumbar region: Secondary | ICD-10-CM | POA: Diagnosis not present

## 2017-03-08 ENCOUNTER — Other Ambulatory Visit: Payer: Self-pay | Admitting: Cardiovascular Disease

## 2017-03-20 DIAGNOSIS — F419 Anxiety disorder, unspecified: Secondary | ICD-10-CM | POA: Diagnosis not present

## 2017-03-20 DIAGNOSIS — G894 Chronic pain syndrome: Secondary | ICD-10-CM | POA: Diagnosis not present

## 2017-03-20 DIAGNOSIS — K219 Gastro-esophageal reflux disease without esophagitis: Secondary | ICD-10-CM | POA: Diagnosis not present

## 2017-03-20 DIAGNOSIS — I2510 Atherosclerotic heart disease of native coronary artery without angina pectoris: Secondary | ICD-10-CM | POA: Diagnosis not present

## 2017-03-20 DIAGNOSIS — I495 Sick sinus syndrome: Secondary | ICD-10-CM | POA: Diagnosis not present

## 2017-03-20 DIAGNOSIS — F5101 Primary insomnia: Secondary | ICD-10-CM | POA: Diagnosis not present

## 2017-03-20 DIAGNOSIS — R7301 Impaired fasting glucose: Secondary | ICD-10-CM | POA: Diagnosis not present

## 2017-03-20 DIAGNOSIS — K7460 Unspecified cirrhosis of liver: Secondary | ICD-10-CM | POA: Diagnosis not present

## 2017-03-20 DIAGNOSIS — Z23 Encounter for immunization: Secondary | ICD-10-CM | POA: Diagnosis not present

## 2017-03-29 ENCOUNTER — Ambulatory Visit (INDEPENDENT_AMBULATORY_CARE_PROVIDER_SITE_OTHER): Payer: Medicare HMO | Admitting: *Deleted

## 2017-03-29 DIAGNOSIS — I495 Sick sinus syndrome: Secondary | ICD-10-CM | POA: Diagnosis not present

## 2017-03-29 NOTE — Progress Notes (Signed)
Remote pacemaker transmission.   

## 2017-03-30 ENCOUNTER — Encounter: Payer: Self-pay | Admitting: Cardiology

## 2017-04-04 LAB — CUP PACEART REMOTE DEVICE CHECK
Brady Statistic AP VP Percent: 0.08 %
Brady Statistic AP VS Percent: 51.61 %
Brady Statistic AS VP Percent: 6.19 %
Brady Statistic RV Percent Paced: 6.31 %
Implantable Lead Implant Date: 20150120
Implantable Lead Model: 5076
Implantable Lead Model: 5076
Lead Channel Impedance Value: 570 Ohm
Lead Channel Impedance Value: 665 Ohm
Lead Channel Pacing Threshold Amplitude: 0.75 V
Lead Channel Sensing Intrinsic Amplitude: 18.5 mV
Lead Channel Sensing Intrinsic Amplitude: 18.5 mV
Lead Channel Sensing Intrinsic Amplitude: 3.5 mV
Lead Channel Setting Pacing Amplitude: 2 V
Lead Channel Setting Pacing Pulse Width: 0.4 ms
Lead Channel Setting Sensing Sensitivity: 2.8 mV
MDC IDC LEAD IMPLANT DT: 20150120
MDC IDC LEAD LOCATION: 753859
MDC IDC LEAD LOCATION: 753860
MDC IDC MSMT BATTERY REMAINING LONGEVITY: 86 mo
MDC IDC MSMT BATTERY VOLTAGE: 3.02 V
MDC IDC MSMT LEADCHNL RA IMPEDANCE VALUE: 437 Ohm
MDC IDC MSMT LEADCHNL RA PACING THRESHOLD AMPLITUDE: 0.625 V
MDC IDC MSMT LEADCHNL RA PACING THRESHOLD PULSEWIDTH: 0.4 ms
MDC IDC MSMT LEADCHNL RA SENSING INTR AMPL: 3.5 mV
MDC IDC MSMT LEADCHNL RV IMPEDANCE VALUE: 608 Ohm
MDC IDC MSMT LEADCHNL RV PACING THRESHOLD PULSEWIDTH: 0.4 ms
MDC IDC PG IMPLANT DT: 20150120
MDC IDC SESS DTM: 20181107135209
MDC IDC SET LEADCHNL RA PACING AMPLITUDE: 1.5 V
MDC IDC STAT BRADY AS VS PERCENT: 42.12 %
MDC IDC STAT BRADY RA PERCENT PACED: 45.51 %

## 2017-04-06 ENCOUNTER — Other Ambulatory Visit: Payer: Self-pay | Admitting: Physical Medicine and Rehabilitation

## 2017-04-06 DIAGNOSIS — M5442 Lumbago with sciatica, left side: Secondary | ICD-10-CM

## 2017-04-11 ENCOUNTER — Other Ambulatory Visit: Payer: Medicare HMO

## 2017-04-12 ENCOUNTER — Other Ambulatory Visit: Payer: Medicare HMO

## 2017-04-14 ENCOUNTER — Other Ambulatory Visit: Payer: Self-pay | Admitting: Family Medicine

## 2017-04-14 DIAGNOSIS — J309 Allergic rhinitis, unspecified: Secondary | ICD-10-CM

## 2017-05-31 ENCOUNTER — Other Ambulatory Visit: Payer: Self-pay | Admitting: Cardiovascular Disease

## 2017-06-22 ENCOUNTER — Encounter: Payer: Self-pay | Admitting: Cardiovascular Disease

## 2017-06-22 DIAGNOSIS — K7460 Unspecified cirrhosis of liver: Secondary | ICD-10-CM | POA: Diagnosis not present

## 2017-06-22 DIAGNOSIS — G894 Chronic pain syndrome: Secondary | ICD-10-CM | POA: Diagnosis not present

## 2017-06-22 DIAGNOSIS — F5101 Primary insomnia: Secondary | ICD-10-CM | POA: Diagnosis not present

## 2017-06-22 DIAGNOSIS — K219 Gastro-esophageal reflux disease without esophagitis: Secondary | ICD-10-CM | POA: Diagnosis not present

## 2017-06-22 DIAGNOSIS — R739 Hyperglycemia, unspecified: Secondary | ICD-10-CM | POA: Diagnosis not present

## 2017-06-22 DIAGNOSIS — I495 Sick sinus syndrome: Secondary | ICD-10-CM | POA: Diagnosis not present

## 2017-06-22 DIAGNOSIS — I2510 Atherosclerotic heart disease of native coronary artery without angina pectoris: Secondary | ICD-10-CM | POA: Diagnosis not present

## 2017-06-22 DIAGNOSIS — F419 Anxiety disorder, unspecified: Secondary | ICD-10-CM | POA: Diagnosis not present

## 2017-06-28 ENCOUNTER — Ambulatory Visit (INDEPENDENT_AMBULATORY_CARE_PROVIDER_SITE_OTHER): Payer: PPO | Admitting: *Deleted

## 2017-06-28 ENCOUNTER — Telehealth: Payer: Self-pay | Admitting: Cardiology

## 2017-06-28 DIAGNOSIS — I495 Sick sinus syndrome: Secondary | ICD-10-CM

## 2017-06-28 NOTE — Telephone Encounter (Signed)
LMOVM reminding pt to send remote transmission.   

## 2017-06-29 ENCOUNTER — Encounter: Payer: Self-pay | Admitting: Cardiology

## 2017-06-29 NOTE — Progress Notes (Signed)
Letter  

## 2017-06-29 NOTE — Progress Notes (Signed)
Remote pacemaker transmission.   

## 2017-07-12 ENCOUNTER — Encounter: Payer: Self-pay | Admitting: Cardiovascular Disease

## 2017-07-12 ENCOUNTER — Ambulatory Visit (INDEPENDENT_AMBULATORY_CARE_PROVIDER_SITE_OTHER): Payer: PPO | Admitting: Cardiovascular Disease

## 2017-07-12 VITALS — BP 98/72 | HR 84 | Ht 59.0 in | Wt 164.0 lb

## 2017-07-12 DIAGNOSIS — R600 Localized edema: Secondary | ICD-10-CM

## 2017-07-12 DIAGNOSIS — I495 Sick sinus syndrome: Secondary | ICD-10-CM | POA: Diagnosis not present

## 2017-07-12 DIAGNOSIS — I10 Essential (primary) hypertension: Secondary | ICD-10-CM | POA: Diagnosis not present

## 2017-07-12 DIAGNOSIS — Z950 Presence of cardiac pacemaker: Secondary | ICD-10-CM | POA: Diagnosis not present

## 2017-07-12 DIAGNOSIS — Z8673 Personal history of transient ischemic attack (TIA), and cerebral infarction without residual deficits: Secondary | ICD-10-CM

## 2017-07-12 DIAGNOSIS — I480 Paroxysmal atrial fibrillation: Secondary | ICD-10-CM

## 2017-07-12 LAB — CUP PACEART INCLINIC DEVICE CHECK
Date Time Interrogation Session: 20190220143650
Implantable Lead Implant Date: 20150120
Implantable Lead Location: 753859
Implantable Lead Model: 5076
Implantable Pulse Generator Implant Date: 20150120
Lead Channel Setting Pacing Amplitude: 2 V
Lead Channel Setting Pacing Pulse Width: 0.4 ms
Lead Channel Setting Sensing Sensitivity: 2.8 mV
MDC IDC LEAD IMPLANT DT: 20150120
MDC IDC LEAD LOCATION: 753860
MDC IDC SET LEADCHNL RA PACING AMPLITUDE: 1.5 V

## 2017-07-12 NOTE — Progress Notes (Signed)
Patient ID: Diamond Miller, female   DOB: 1933-05-02, 84 y.o.   MRN: [id number]    Cardiology Office Note    Date:  07/12/2017   ID:  Diamond Miller, DOB [birth year], MRN [id number]  PCP:  Vernie Shanks, MD  Cardiologist:   Sanda Klein, MD   No chief complaint on file.   History of Present Illness:  Diamond Miller is a 84 y.o. female with a history of tachycardia-bradycardia syndrome (sinus bradycardia, paroxysmal atrial fibrillation and atrial flutter) for which received a dual-chamber permanent pacemaker in 2015. Despite high embolic risk (presumed embolic stroke in December 2014) she is not receiving anticoagulation since she has had numerous falls with serious injury and has biliary cirrhosis.   Is generally doing well from a cardiac point of view without complaints of shortness of breath, angina, syncope, severe dizziness or palpitations. She has not had any severe bleeding problems and denies any new neurological complaints.  Frequently complains of being weak.  Biggest complaints remain chronic pain in her back.  Her last back injection with Dr. Nelva Bush did not help.  Apparently there was a discussion about MRI which was canceled because of her pacemaker (but her pacemaker is actually MRI conditional she could have a back MRI if necessary).  She has not had any further epistaxis. She has chronic ankle edema, symmetrical, 2-3+.  Her weight is up 15 pounds since last year, and is hard to tell how much of it is real weight, how much of it edema.  Pacemaker interrogation shows normal device function. Her Medtronic Advisa dual-chamber pacemaker was implanted in 2015 and has roughly 6.5 years of remaining longevity. She has a 53 % atrial pacing and 6 % ventricular pacing. Since her last device check there has been 25 % Atrial mode switch, an increase in the overall burden of atrial fibrillation from last year, but fairly stable over the last 12 months. Ventricular rate control is  adequate, albeit not perfect. Her episodes of arrhythmia occur randomly, unpredictably and appear to be asymptomatic.  She is in atrial fibrillation today and does not appear to be aware of it.  She has not had any stroke-like symptoms.  Despite her atrial fibrillation she is not on anticoagulation due to her perceived high bleeding risk and risk of falls.  Recently performed labs at [facility] show normal BUN 19 and creatinine 1.07 and electrolytes, normal AST at 21, normal ALT at 38, bilirubin 1.0 and albumin 3.7. I cannot retrieve an alk phos level, which has been > 300 for years.   Past Medical History:  Diagnosis Date  . Acid reflux   . Anemia 1957  . Anxiety   . Chronic back pain   . COPD (chronic obstructive pulmonary disease) ([place])   . Degenerative disc disease, cervical   . Dysrhythmia    "skips" (06/12/2013)  . Exertional shortness of breath    "recently" (06/12/2013)  . High cholesterol   . History of blood transfusion    "think it was when I had knee OR" (06/12/2013)  . Hypertension   . Liver cirrhosis ([place])   . Pacemaker, Medtronic placed 06/01/13 06/11/2013  . Scoliosis   . Stroke ([place]) 05/14/2013   denies residual on 06/12/2013    Past Surgical History:  Procedure Laterality Date  . CATARACT EXTRACTION W/ INTRAOCULAR LENS  IMPLANT, BILATERAL Bilateral 2006-2007  . INSERT / REPLACE / REMOVE PACEMAKER  06/11/2013  . JOINT REPLACEMENT    . PACEMAKER INSERTION  06/01/13  Dr. Sallyanne Kuster Medtronic device  . PERMANENT PACEMAKER INSERTION N/A 06/11/2013   Procedure: PERMANENT PACEMAKER INSERTION;  Surgeon: Sanda Klein, MD;  Location: [place] CATH LAB;  Service: Cardiovascular;  Laterality: N/A;  . REPLACEMENT TOTAL KNEE Right 2010  . TEE WITHOUT CARDIOVERSION N/A 05/17/2013   Procedure: TRANSESOPHAGEAL ECHOCARDIOGRAM (TEE);  Surgeon: Thayer Headings, MD;  Location: [place];  Service: Cardiovascular;  Laterality: N/A;  . TONSILLECTOMY  ~ 1961    Outpatient Medications Prior to  Visit  Medication Sig Dispense Refill  . Ascorbic Acid (VITAMIN C) 1000 MG tablet Take 1,000 mg by mouth daily. Reported on 05/11/2015    . atorvastatin (LIPITOR) 40 MG tablet TAKE 1 TABLET (40 MG TOTAL) BY MOUTH DAILY AT 6 PM. 90 tablet 1  . cetirizine (ZYRTEC) 10 MG tablet TAKE 1 TABLET BY MOUTH DAILY. 90 tablet 3  . clopidogrel (PLAVIX) 75 MG tablet TAKE 1 TABLET BY MOUTH DAILY. 90 tablet 4  . diazepam (VALIUM) 5 MG tablet Take 0.5 tablets (2.5 mg total) by mouth every 12 (twelve) hours as needed for anxiety. 30 tablet 5  . furosemide (LASIX) 40 MG tablet TAKE 1 TABLET BY MOUTH DAILY AS NEEDED. 90 tablet 2  . ketoconazole (NIZORAL) 2 % cream Apply 1 application topically daily. 30 g 0  . magnesium oxide (MAG-OX) 400 MG tablet Take 400 mg by mouth daily.      . metoprolol succinate (TOPROL-XL) 50 MG 24 hr tablet TAKE 1 AND 1/2 TABLETS BY MOUTH ONCE DAILY WITH FOOD. 135 tablet 1  . Multiple Vitamin (MULITIVITAMIN WITH MINERALS) TABS Take 1 tablet by mouth daily.    Marland Kitchen omeprazole (PRILOSEC) 40 MG capsule Take 1 capsule (40 mg total) by mouth daily. 90 capsule 1  . oxycodone (OXY-IR) 5 MG capsule Take 5 mg by mouth every 6 (six) hours as needed for pain.     . sodium chloride (OCEAN) 0.65 % SOLN nasal spray Place 1 spray into both nostrils as needed for congestion. 1 Bottle 0  . traZODone (DESYREL) 150 MG tablet TAKE 1/2 TABLET BY MOUTH DAILY. (Patient taking differently: TAKE 1 TABLET BY MOUTH DAILY.) 30 tablet 11   No facility-administered medications prior to visit.      Allergies:   Aspirin; Tylenol [acetaminophen]; and Penicillins   Social History   Socioeconomic History  . Marital status: Widowed    Spouse name: None  . Number of children: None  . Years of education: None  . Highest education level: None  Social Needs  . Financial resource strain: None  . Food insecurity - worry: None  . Food insecurity - inability: None  . Transportation needs - medical: None  . Transportation  needs - non-medical: None  Occupational History  . None  Tobacco Use  . Smoking status: Never Smoker  . Smokeless tobacco: Never Used  Substance and Sexual Activity  . Alcohol use: No  . Drug use: No  . Sexual activity: No  Other Topics Concern  . None  Social History Narrative  . None     Family History:  The patient's family history includes Dementia (age of onset: 2) in her brother; Heart disease in her mother; Hypertension in her sister.   ROS:   Please see the history of present illness.    ROS All other systems reviewed and are negative.   PHYSICAL EXAM:   VS:  BP 98/72   Pulse 84   Ht 4' 11"  (1.499 m)   Wt 164 lb (74.4  kg)   BMI 33.12 kg/m     General: Alert, oriented x3, no distress, obese Head: no evidence of trauma, PERRL, EOMI, no exophtalmos or lid lag, no myxedema, no xanthelasma; normal ears, nose and oropharynx Neck: normal jugular venous pulsations and no hepatojugular reflux; brisk carotid pulses without delay and no carotid bruits Chest: clear to auscultation, no signs of consolidation by percussion or palpation, normal fremitus, symmetrical and full respiratory excursions. Healthy pacemaker site left subclavian area Cardiovascular: normal position and quality of the apical impulse, regular rhythm, normal first and second heart sounds, no murmurs, rubs or gallops Abdomen: no tenderness or distention, no masses by palpation, no abnormal pulsatility or arterial bruits, normal bowel sounds, no hepatosplenomegaly Extremities: no clubbing, cyanosis; symmetrical 2-3+ ankle edema; 2+ radial, ulnar and brachial pulses bilaterally; hard to palpate the pedal pulses due to edema Psych: Normal mood and affect   Wt Readings from Last 3 Encounters:  07/12/17 164 lb (74.4 kg)  06/29/16 149 lb (67.6 kg)  05/03/16 150 lb (68 kg)      Studies/Labs Reviewed:   EKG:  EKG is ordered today and shows atrial fibrillation with controlled rate, poor anterior R wave  progression, QTc 423 ms, no ischemic changes. Recent Labs: No results found for requested labs within last 8760 hours.   Lipid Panel    Component Value Date/Time   CHOL 168 05/15/2013 0155   TRIG 123 05/15/2013 0155   HDL 38 (L) 05/15/2013 0155   CHOLHDL 4.4 05/15/2013 0155   VLDL 25 05/15/2013 0155   LDLCALC 105 (H) 05/15/2013 0155     ASSESSMENT:    1. Paroxysmal atrial fibrillation   2. History of embolic stroke   3. Tachy-brady syndrome ([place])   4. Pacemaker, Medtronic placed 06/11/13   5. Bilateral leg edema   6. Essential hypertension      PLAN:  In order of problems listed above:  1. PAF: episodes are frequent, but do not appear to be associated with any functional deterioration.  Rate control is borderline adequate, but her blood pressure does not allow additional increase in her beta-blocker dose.  2. Hx of CVA: She has been on clopidogrel since a stroke in 2014 (before the diagnosis of after was confirmed).  When the atrial fibrillation diagnosis was made, anticoagulation was offered, but declined by the patient.  She does have frequent falls.  She has not had any new embolic events or neurological symptoms since 2014.  CHADSVasc score 6, but felt to be at very high risk for bleeding complications with anticoagulation.   3. PPM: implanted for sinus node dysfunction and tachycardia-bradycardia syndrome (7-second pauses following bursts of atrial tachycardia), asymptomatic since pacemaker implantation. Normal pacemaker function. Check via [facility] every 3 months and office visits at least yearly 4. Edema: It is difficult to say to what degree her ankle edema is related to cirrhosis versus possible diastolic heart failure.  She had normal left ventricular ejection fraction by echo in 2014.  There is a mention of grade 1 diastolic dysfunction.  There is really no room to increase her diuretic.  She has no evidence of pulmonary congestion.  Her weight is substantially higher than  it was a year ago, hard to say how much of it is due to edema how much is true weight gain. 5. HTN: Good/excessive blood pressure control.  Cannot increase the beta-blocker or diuretic further due to her blood pressure.    Medication Adjustments/Labs and Tests Ordered: Current medicines are reviewed  at length with the patient today.  Concerns regarding medicines are outlined above.  Medication changes, Labs and Tests ordered today are listed in the Patient Instructions below. Patient Instructions  Dr Sallyanne Kuster recommends that you continue on your current medications as directed. Please refer to the Current Medication list given to you today.  Remote monitoring is used to monitor your Pacemaker or ICD from home. This monitoring reduces the number of office visits required to check your device to one time per year. It allows [place] to keep an eye on the functioning of your device to ensure it is working properly. You are scheduled for a device check from home on Wednesday, May 8th, 2019. You may send your transmission at any time that day. If you have a wireless device, the transmission will be sent automatically. After your physician reviews your transmission, you will receive a notification with your next transmission date.  Dr Sallyanne Kuster recommends that you schedule a follow-up appointment in 12 months with a pacemaker check. You will receive a reminder letter in the mail two months in advance. If you don't receive a letter, please call our office to schedule the follow-up appointment.  If you need a refill on your cardiac medications before your next appointment, please call your pharmacy.   Signed, Sanda Klein, MD  07/12/2017 6:54 PM    [place], [place], [place]  [address] Phone: [phone]; Fax: [phone]

## 2017-07-12 NOTE — Patient Instructions (Signed)
Dr Royann Shiversroitoru recommends that you continue on your current medications as directed. Please refer to the Current Medication list given to you today.  Remote monitoring is used to monitor your Pacemaker or ICD from home. This monitoring reduces the number of office visits required to check your device to one time per year. It allows us to keep an eye on the functioning of your device to ensure it is working properly. You are scheduled for a device check from home on Wednesday, May 8th, 2019. You may send your transmission at any time that day. If you have a wireless device, the transmission will be sent automatically. After your physician reviews your transmission, you will receive a notification with your next transmission date.  Dr Royann Shiversroitoru recommends that you schedule a follow-up appointment in 12 months with a pacemaker check. You will receive a reminder letter in the mail two months in advance. If you don't receive a letter, please call our office to schedule the follow-up appointment.  If you need a refill on your cardiac medications before your next appointment, please call your pharmacy.

## 2017-07-17 ENCOUNTER — Other Ambulatory Visit: Payer: Self-pay | Admitting: Family Medicine

## 2017-07-17 ENCOUNTER — Other Ambulatory Visit: Payer: Self-pay | Admitting: Cardiovascular Disease

## 2017-07-17 DIAGNOSIS — J309 Allergic rhinitis, unspecified: Secondary | ICD-10-CM

## 2017-07-17 NOTE — Telephone Encounter (Signed)
Pharmacy requesting refills. Thanks!  

## 2017-07-17 NOTE — Telephone Encounter (Signed)
Please advise pharmacy she is no longer a patient here, I think she sees Dr. Katrinka BlazingSmith in [place].

## 2017-07-19 LAB — CUP PACEART REMOTE DEVICE CHECK
Brady Statistic AP VP Percent: 0.07 %
Brady Statistic AP VS Percent: 43.28 %
Brady Statistic AS VP Percent: 5.82 %
Brady Statistic AS VS Percent: 50.84 %
Brady Statistic RV Percent Paced: 5.96 %
Date Time Interrogation Session: 20190206221829
Implantable Lead Implant Date: 20150120
Implantable Lead Location: 753859
Implantable Lead Model: 5076
Lead Channel Impedance Value: 551 Ohm
Lead Channel Impedance Value: 551 Ohm
Lead Channel Impedance Value: 589 Ohm
Lead Channel Pacing Threshold Amplitude: 0.875 V
Lead Channel Sensing Intrinsic Amplitude: 16.375 mV
Lead Channel Sensing Intrinsic Amplitude: 16.375 mV
Lead Channel Sensing Intrinsic Amplitude: 2.5 mV
Lead Channel Sensing Intrinsic Amplitude: 2.5 mV
Lead Channel Setting Pacing Amplitude: 1.5 V
Lead Channel Setting Pacing Amplitude: 2 V
Lead Channel Setting Pacing Pulse Width: 0.4 ms
Lead Channel Setting Sensing Sensitivity: 2.8 mV
MDC IDC LEAD IMPLANT DT: 20150120
MDC IDC LEAD LOCATION: 753860
MDC IDC MSMT BATTERY REMAINING LONGEVITY: 78 mo
MDC IDC MSMT BATTERY VOLTAGE: 3.02 V
MDC IDC MSMT LEADCHNL RA IMPEDANCE VALUE: 437 Ohm
MDC IDC MSMT LEADCHNL RA PACING THRESHOLD AMPLITUDE: 0.625 V
MDC IDC MSMT LEADCHNL RA PACING THRESHOLD PULSEWIDTH: 0.4 ms
MDC IDC MSMT LEADCHNL RV PACING THRESHOLD PULSEWIDTH: 0.4 ms
MDC IDC PG IMPLANT DT: 20150120
MDC IDC STAT BRADY RA PERCENT PACED: 36.84 %

## 2017-07-20 NOTE — Telephone Encounter (Signed)
Called informed. Pharmacy.

## 2017-07-26 DIAGNOSIS — M545 Low back pain: Secondary | ICD-10-CM | POA: Diagnosis not present

## 2017-07-26 DIAGNOSIS — Z79891 Long term (current) use of opiate analgesic: Secondary | ICD-10-CM | POA: Diagnosis not present

## 2017-07-26 DIAGNOSIS — M5136 Other intervertebral disc degeneration, lumbar region: Secondary | ICD-10-CM | POA: Diagnosis not present

## 2017-08-29 ENCOUNTER — Emergency Department (HOSPITAL_COMMUNITY): Payer: PPO

## 2017-08-29 ENCOUNTER — Inpatient Hospital Stay (HOSPITAL_COMMUNITY)
Admission: EM | Admit: 2017-08-29 | Discharge: 2017-09-20 | DRG: 064 | Disposition: E | Payer: PPO | Attending: Neurology | Admitting: Neurology

## 2017-08-29 DIAGNOSIS — Z9842 Cataract extraction status, left eye: Secondary | ICD-10-CM

## 2017-08-29 DIAGNOSIS — Z452 Encounter for adjustment and management of vascular access device: Secondary | ICD-10-CM

## 2017-08-29 DIAGNOSIS — I480 Paroxysmal atrial fibrillation: Secondary | ICD-10-CM | POA: Diagnosis present

## 2017-08-29 DIAGNOSIS — G8194 Hemiplegia, unspecified affecting left nondominant side: Secondary | ICD-10-CM | POA: Diagnosis not present

## 2017-08-29 DIAGNOSIS — I10 Essential (primary) hypertension: Secondary | ICD-10-CM | POA: Diagnosis present

## 2017-08-29 DIAGNOSIS — E7800 Pure hypercholesterolemia, unspecified: Secondary | ICD-10-CM | POA: Diagnosis not present

## 2017-08-29 DIAGNOSIS — K7460 Unspecified cirrhosis of liver: Secondary | ICD-10-CM | POA: Diagnosis not present

## 2017-08-29 DIAGNOSIS — E785 Hyperlipidemia, unspecified: Secondary | ICD-10-CM | POA: Diagnosis not present

## 2017-08-29 DIAGNOSIS — R1312 Dysphagia, oropharyngeal phase: Secondary | ICD-10-CM | POA: Diagnosis not present

## 2017-08-29 DIAGNOSIS — M549 Dorsalgia, unspecified: Secondary | ICD-10-CM | POA: Diagnosis not present

## 2017-08-29 DIAGNOSIS — Z888 Allergy status to other drugs, medicaments and biological substances status: Secondary | ICD-10-CM

## 2017-08-29 DIAGNOSIS — R509 Fever, unspecified: Secondary | ICD-10-CM | POA: Diagnosis not present

## 2017-08-29 DIAGNOSIS — R29810 Facial weakness: Secondary | ICD-10-CM | POA: Diagnosis not present

## 2017-08-29 DIAGNOSIS — G935 Compression of brain: Secondary | ICD-10-CM | POA: Diagnosis not present

## 2017-08-29 DIAGNOSIS — R471 Dysarthria and anarthria: Secondary | ICD-10-CM | POA: Diagnosis present

## 2017-08-29 DIAGNOSIS — Z79899 Other long term (current) drug therapy: Secondary | ICD-10-CM

## 2017-08-29 DIAGNOSIS — Z9181 History of falling: Secondary | ICD-10-CM

## 2017-08-29 DIAGNOSIS — Z6831 Body mass index (BMI) 31.0-31.9, adult: Secondary | ICD-10-CM

## 2017-08-29 DIAGNOSIS — I340 Nonrheumatic mitral (valve) insufficiency: Secondary | ICD-10-CM | POA: Diagnosis not present

## 2017-08-29 DIAGNOSIS — I6502 Occlusion and stenosis of left vertebral artery: Secondary | ICD-10-CM | POA: Diagnosis present

## 2017-08-29 DIAGNOSIS — Z66 Do not resuscitate: Secondary | ICD-10-CM | POA: Diagnosis not present

## 2017-08-29 DIAGNOSIS — I69351 Hemiplegia and hemiparesis following cerebral infarction affecting right dominant side: Secondary | ICD-10-CM | POA: Diagnosis not present

## 2017-08-29 DIAGNOSIS — Z96651 Presence of right artificial knee joint: Secondary | ICD-10-CM | POA: Diagnosis present

## 2017-08-29 DIAGNOSIS — Z886 Allergy status to analgesic agent status: Secondary | ICD-10-CM

## 2017-08-29 DIAGNOSIS — Z515 Encounter for palliative care: Secondary | ICD-10-CM | POA: Diagnosis not present

## 2017-08-29 DIAGNOSIS — M419 Scoliosis, unspecified: Secondary | ICD-10-CM | POA: Diagnosis present

## 2017-08-29 DIAGNOSIS — H518 Other specified disorders of binocular movement: Secondary | ICD-10-CM | POA: Diagnosis not present

## 2017-08-29 DIAGNOSIS — Z5329 Procedure and treatment not carried out because of patient's decision for other reasons: Secondary | ICD-10-CM | POA: Diagnosis present

## 2017-08-29 DIAGNOSIS — J690 Pneumonitis due to inhalation of food and vomit: Secondary | ICD-10-CM | POA: Diagnosis not present

## 2017-08-29 DIAGNOSIS — I4891 Unspecified atrial fibrillation: Secondary | ICD-10-CM | POA: Diagnosis not present

## 2017-08-29 DIAGNOSIS — I481 Persistent atrial fibrillation: Secondary | ICD-10-CM | POA: Diagnosis not present

## 2017-08-29 DIAGNOSIS — I071 Rheumatic tricuspid insufficiency: Secondary | ICD-10-CM | POA: Diagnosis present

## 2017-08-29 DIAGNOSIS — Z79891 Long term (current) use of opiate analgesic: Secondary | ICD-10-CM

## 2017-08-29 DIAGNOSIS — G936 Cerebral edema: Secondary | ICD-10-CM | POA: Diagnosis present

## 2017-08-29 DIAGNOSIS — Z8249 Family history of ischemic heart disease and other diseases of the circulatory system: Secondary | ICD-10-CM

## 2017-08-29 DIAGNOSIS — M8580 Other specified disorders of bone density and structure, unspecified site: Secondary | ICD-10-CM | POA: Diagnosis present

## 2017-08-29 DIAGNOSIS — R0989 Other specified symptoms and signs involving the circulatory and respiratory systems: Secondary | ICD-10-CM | POA: Diagnosis not present

## 2017-08-29 DIAGNOSIS — Z961 Presence of intraocular lens: Secondary | ICD-10-CM | POA: Diagnosis not present

## 2017-08-29 DIAGNOSIS — G8929 Other chronic pain: Secondary | ICD-10-CM | POA: Diagnosis present

## 2017-08-29 DIAGNOSIS — Z950 Presence of cardiac pacemaker: Secondary | ICD-10-CM | POA: Diagnosis not present

## 2017-08-29 DIAGNOSIS — I361 Nonrheumatic tricuspid (valve) insufficiency: Secondary | ICD-10-CM | POA: Diagnosis not present

## 2017-08-29 DIAGNOSIS — I63411 Cerebral infarction due to embolism of right middle cerebral artery: Secondary | ICD-10-CM | POA: Diagnosis not present

## 2017-08-29 DIAGNOSIS — I63511 Cerebral infarction due to unspecified occlusion or stenosis of right middle cerebral artery: Secondary | ICD-10-CM

## 2017-08-29 DIAGNOSIS — R29818 Other symptoms and signs involving the nervous system: Secondary | ICD-10-CM | POA: Diagnosis not present

## 2017-08-29 DIAGNOSIS — I639 Cerebral infarction, unspecified: Secondary | ICD-10-CM

## 2017-08-29 DIAGNOSIS — Z7982 Long term (current) use of aspirin: Secondary | ICD-10-CM

## 2017-08-29 DIAGNOSIS — I63413 Cerebral infarction due to embolism of bilateral middle cerebral arteries: Secondary | ICD-10-CM | POA: Diagnosis not present

## 2017-08-29 DIAGNOSIS — J449 Chronic obstructive pulmonary disease, unspecified: Secondary | ICD-10-CM | POA: Diagnosis not present

## 2017-08-29 DIAGNOSIS — R1310 Dysphagia, unspecified: Secondary | ICD-10-CM | POA: Diagnosis not present

## 2017-08-29 DIAGNOSIS — K219 Gastro-esophageal reflux disease without esophagitis: Secondary | ICD-10-CM | POA: Diagnosis not present

## 2017-08-29 DIAGNOSIS — Z7189 Other specified counseling: Secondary | ICD-10-CM | POA: Diagnosis not present

## 2017-08-29 DIAGNOSIS — I4819 Other persistent atrial fibrillation: Secondary | ICD-10-CM

## 2017-08-29 DIAGNOSIS — J9811 Atelectasis: Secondary | ICD-10-CM | POA: Diagnosis not present

## 2017-08-29 DIAGNOSIS — Z7902 Long term (current) use of antithrombotics/antiplatelets: Secondary | ICD-10-CM

## 2017-08-29 DIAGNOSIS — R531 Weakness: Secondary | ICD-10-CM | POA: Diagnosis not present

## 2017-08-29 DIAGNOSIS — Z9841 Cataract extraction status, right eye: Secondary | ICD-10-CM

## 2017-08-29 DIAGNOSIS — Z880 Allergy status to penicillin: Secondary | ICD-10-CM

## 2017-08-29 DIAGNOSIS — I6789 Other cerebrovascular disease: Secondary | ICD-10-CM | POA: Diagnosis not present

## 2017-08-29 DIAGNOSIS — E669 Obesity, unspecified: Secondary | ICD-10-CM | POA: Diagnosis present

## 2017-08-29 DIAGNOSIS — I6521 Occlusion and stenosis of right carotid artery: Secondary | ICD-10-CM | POA: Diagnosis not present

## 2017-08-29 LAB — DIFFERENTIAL
BASOS PCT: 0 %
Basophils Absolute: 0 10*3/uL (ref 0.0–0.1)
Eosinophils Absolute: 0 10*3/uL (ref 0.0–0.7)
Eosinophils Relative: 0 %
LYMPHS ABS: 1.4 10*3/uL (ref 0.7–4.0)
Lymphocytes Relative: 14 %
MONO ABS: 0.9 10*3/uL (ref 0.1–1.0)
Monocytes Relative: 8 %
NEUTROS ABS: 8 10*3/uL — AB (ref 1.7–7.7)
Neutrophils Relative %: 78 %

## 2017-08-29 LAB — CBG MONITORING, ED: Glucose-Capillary: 103 mg/dL — ABNORMAL HIGH (ref 65–99)

## 2017-08-29 LAB — I-STAT CHEM 8, ED
BUN: 12 mg/dL (ref 6–20)
CALCIUM ION: 1.12 mmol/L — AB (ref 1.15–1.40)
CHLORIDE: 98 mmol/L — AB (ref 101–111)
Creatinine, Ser: 1 mg/dL (ref 0.44–1.00)
GLUCOSE: 112 mg/dL — AB (ref 65–99)
HCT: 46 % (ref 36.0–46.0)
Hemoglobin: 15.6 g/dL — ABNORMAL HIGH (ref 12.0–15.0)
POTASSIUM: 4.2 mmol/L (ref 3.5–5.1)
Sodium: 138 mmol/L (ref 135–145)
TCO2: 29 mmol/L (ref 22–32)

## 2017-08-29 LAB — PROTIME-INR
INR: 1.06
Prothrombin Time: 13.8 seconds (ref 11.4–15.2)

## 2017-08-29 LAB — CBC
HCT: 45.6 % (ref 36.0–46.0)
Hemoglobin: 15.3 g/dL — ABNORMAL HIGH (ref 12.0–15.0)
MCH: 29.8 pg (ref 26.0–34.0)
MCHC: 33.6 g/dL (ref 30.0–36.0)
MCV: 88.9 fL (ref 78.0–100.0)
Platelets: 174 10*3/uL (ref 150–400)
RBC: 5.13 MIL/uL — ABNORMAL HIGH (ref 3.87–5.11)
RDW: 13 % (ref 11.5–15.5)
WBC: 10.3 10*3/uL (ref 4.0–10.5)

## 2017-08-29 LAB — COMPREHENSIVE METABOLIC PANEL
ALK PHOS: 198 U/L — AB (ref 38–126)
ALT: 25 U/L (ref 14–54)
AST: 53 U/L — ABNORMAL HIGH (ref 15–41)
Albumin: 3.3 g/dL — ABNORMAL LOW (ref 3.5–5.0)
Anion gap: 11 (ref 5–15)
BILIRUBIN TOTAL: 0.9 mg/dL (ref 0.3–1.2)
BUN: 11 mg/dL (ref 6–20)
CALCIUM: 9.5 mg/dL (ref 8.9–10.3)
CHLORIDE: 98 mmol/L — AB (ref 101–111)
CO2: 26 mmol/L (ref 22–32)
CREATININE: 1.08 mg/dL — AB (ref 0.44–1.00)
GFR, EST AFRICAN AMERICAN: 54 mL/min — AB (ref >60)
GFR, EST NON AFRICAN AMERICAN: 46 mL/min — AB (ref >60)
Glucose, Bld: 109 mg/dL — ABNORMAL HIGH (ref 65–99)
Potassium: 4.1 mmol/L (ref 3.5–5.1)
Sodium: 135 mmol/L (ref 135–145)
Total Protein: 7.4 g/dL (ref 6.5–8.1)

## 2017-08-29 LAB — SODIUM: Sodium: 136 mmol/L (ref 135–145)

## 2017-08-29 LAB — APTT: aPTT: 28 seconds (ref 24–36)

## 2017-08-29 LAB — I-STAT TROPONIN, ED: TROPONIN I, POC: 0 ng/mL (ref 0.00–0.08)

## 2017-08-29 MED ORDER — SENNOSIDES-DOCUSATE SODIUM 8.6-50 MG PO TABS: 1.0000 | ORAL_TABLET | Freq: Every evening | ORAL | Status: DC | PRN

## 2017-08-29 MED ORDER — SODIUM CHLORIDE 3 % IV SOLN
INTRAVENOUS | Status: DC
  Filled 2017-08-29 (×2): qty 500

## 2017-08-29 MED ORDER — HEPARIN SODIUM (PORCINE) 5000 UNIT/ML IJ SOLN
5000.0000 [IU] | Freq: Three times a day (TID) | INTRAMUSCULAR | Status: DC
  Administered 2017-08-30 – 2017-09-02 (×10): 5000 [IU] via SUBCUTANEOUS
  Filled 2017-08-29 (×10): qty 1

## 2017-08-29 MED ORDER — HYDRALAZINE HCL 20 MG/ML IJ SOLN: 10.0000 mg | INTRAMUSCULAR | Status: DC | PRN

## 2017-08-29 MED ORDER — IOPAMIDOL (ISOVUE-370) INJECTION 76%
INTRAVENOUS | Status: AC
  Administered 2017-08-29: 18:00:00
  Filled 2017-08-29: qty 100

## 2017-08-29 MED ORDER — STROKE: EARLY STAGES OF RECOVERY BOOK
Freq: Once | Status: AC
  Administered 2017-08-30: 1
  Filled 2017-08-29: qty 1

## 2017-08-29 MED ORDER — HYDRALAZINE HCL 20 MG/ML IJ SOLN
10.0000 mg | INTRAMUSCULAR | Status: DC | PRN
Start: 2017-08-29 — End: 2017-09-03
  Administered 2017-08-31: 10 mg via INTRAVENOUS
  Filled 2017-08-29: qty 1

## 2017-08-29 MED ORDER — SODIUM CHLORIDE 3 % IV SOLN
INTRAVENOUS | Status: DC
  Administered 2017-08-29: 50 mL/h via INTRAVENOUS
  Administered 2017-08-30: 75 mL/h via INTRAVENOUS
  Administered 2017-08-30: 50 mL/h via INTRAVENOUS
  Administered 2017-08-30: 75 mL/h via INTRAVENOUS
  Administered 2017-08-31: 50 mL/h via INTRAVENOUS
  Filled 2017-08-29 (×7): qty 500

## 2017-08-29 NOTE — Plan of Care (Signed)
Patient currently being admitted to the ICU for need of 3% saline and TRH will take over care once out of the ICU.

## 2017-08-29 NOTE — Consult Note (Signed)
Referring Physician: Dr. Ralene Bathe    Chief Complaint: Left hemiplegia  HPI: Diamond Miller is an 84 y.o. female who presents to the ED via EMS after a neighbor found her laying in her bed immobile. LKN was yesterday at 6:00 PM. Per EMS she had left sided weakness, left facial droop and right gaze deviation. The patient states that she is not on anticoagulation, but takes Plavix. She has a history of prior stroke with residual right sided weakness.   CT head in the ED shows a large completed right MCA ischemic infarction.   LSN: 6:00 PM yesterday tPA Given: No: Out of time window.   Past Medical History:  Diagnosis Date  . Acid reflux   . Anemia 1957  . Anxiety   . Chronic back pain   . COPD (chronic obstructive pulmonary disease) ([place])   . Degenerative disc disease, cervical   . Dysrhythmia    "skips" (06/12/2013)  . Exertional shortness of breath    "recently" (06/12/2013)  . High cholesterol   . History of blood transfusion    "think it was when I had knee OR" (06/12/2013)  . Hypertension   . Liver cirrhosis ([place])   . Pacemaker, Medtronic placed 06/01/13 06/11/2013  . Scoliosis   . Stroke ([place]) 05/14/2013   denies residual on 06/12/2013    Past Surgical History:  Procedure Laterality Date  . CATARACT EXTRACTION W/ INTRAOCULAR LENS  IMPLANT, BILATERAL Bilateral 2006-2007  . INSERT / REPLACE / REMOVE PACEMAKER  06/11/2013  . JOINT REPLACEMENT    . PACEMAKER INSERTION  06/01/13   Dr. Sallyanne Kuster Medtronic device  . PERMANENT PACEMAKER INSERTION N/A 06/11/2013   Procedure: PERMANENT PACEMAKER INSERTION;  Surgeon: Sanda Klein, MD;  Location: [facility];  Service: Cardiovascular;  Laterality: N/A;  . REPLACEMENT TOTAL KNEE Right 2010  . TEE WITHOUT CARDIOVERSION N/A 05/17/2013   Procedure: TRANSESOPHAGEAL ECHOCARDIOGRAM (TEE);  Surgeon: Thayer Headings, MD;  Location: [facility];  Service: Cardiovascular;  Laterality: N/A;  . TONSILLECTOMY  ~ 1961    Family History  Problem  Relation Age of Onset  . Heart disease Mother   . Hypertension Sister   . Dementia Brother 75   Social History:  reports that she has never smoked. She has never used smokeless tobacco. She reports that she does not drink alcohol or use drugs.  Allergies:  Allergies  Allergen Reactions  . Aspirin Other (See Comments)    Liver function; cirrhosis  . Tylenol [Acetaminophen] Other (See Comments)    Liver function; cirrhosis   . Other Other (See Comments)    No red meat because of cirrhosis and heart  . Penicillins Rash    Has patient had a PCN reaction causing immediate rash, facial/tongue/throat swelling, SOB or lightheadedness with hypotension: Yes Has patient had a PCN reaction causing severe rash involving mucus membranes or skin necrosis: Unk Has patient had a PCN reaction that required hospitalization: Unk Has patient had a PCN reaction occurring within the last 10 years: No If all of the above answers are "NO", then may proceed with Cephalosporin use.     Medications:    ROS: As per HPI.   Physical Examination: There were no vitals taken for this visit.  HEENT: /AT Lungs: Respirations unlabored Ext: Bilateral edema distal lower extremities  Neurologic Examination: Mental Status: Awake. Able to answer most questions and follow right-sided commands. Dysarthric without errors of syntax or grammar.  Dense left hemineglect.  Cranial Nerves: II:  Left visual  field cut. Blinks to threat on right. PERRL.  III,IV, VI: Forced right gaze deviation can be overcome with oculocephalic maneuver V,VII: Left facial droop. Decreased reactivity to left sided visual stimuli.  VIII: hearing intact to voice IX,X: Unable to visualize palate XI: Head tonically rotated to right XII: Does not protrude tongue to command.  Motor: RUE: 4+/5 RLE: 4/5 LUE: 0.5 LLE: 2/5  Sensory: Insensate to FT on the left.  Deep Tendon Reflexes:  2-3+ bilateral upper ext reflexes Absent lower ext  reflexes.  Plantars: Mute bilaterally Cerebellar: No ataxia with right FNF. Unable to perform on left Gait: Unable to assess  Results for orders placed or performed during the hospital encounter of 08/30/2017 (from the past 48 hour(s))  CBG monitoring, ED     Status: Abnormal   Collection Time: 08/25/2017  5:47 PM  Result Value Ref Range   Glucose-Capillary 103 (H) 65 - 99 mg/dL   Comment 1 Notify RN    Comment 2 Document in Chart   I-stat troponin, ED     Status: None   Collection Time: 09/15/2017  5:49 PM  Result Value Ref Range   Troponin i, poc 0.00 0.00 - 0.08 ng/mL   Comment 3            Comment: Due to the release kinetics of cTnI, a negative result within the first hours of the onset of symptoms does not rule out myocardial infarction with certainty. If myocardial infarction is still suspected, repeat the test at appropriate intervals.   I-Stat Chem 8, ED     Status: Abnormal   Collection Time: 08/27/2017  5:51 PM  Result Value Ref Range   Sodium 138 135 - 145 mmol/L   Potassium 4.2 3.5 - 5.1 mmol/L   Chloride 98 (L) 101 - 111 mmol/L   BUN 12 6 - 20 mg/dL   Creatinine, Ser 1.00 0.44 - 1.00 mg/dL   Glucose, Bld 112 (H) 65 - 99 mg/dL   Calcium, Ion 1.12 (L) 1.15 - 1.40 mmol/L   TCO2 29 22 - 32 mmol/L   Hemoglobin 15.6 (H) 12.0 - 15.0 g/dL   HCT 46.0 36.0 - 46.0 %  Protime-INR     Status: None   Collection Time: 09/06/2017  5:59 PM  Result Value Ref Range   Prothrombin Time 13.8 11.4 - 15.2 seconds   INR 1.06     Comment: Performed at [facility] [address], [place], [place] [address]  APTT     Status: None   Collection Time: 09/10/2017  5:59 PM  Result Value Ref Range   aPTT 28 24 - 36 seconds    Comment: Performed at [facility] [address], [place], [place] [address]  CBC     Status: Abnormal   Collection Time: 09/01/2017  5:59 PM  Result Value Ref Range   WBC 10.3 4.0 - 10.5 K/uL   RBC 5.13 (H) 3.87 - 5.11 MIL/uL   Hemoglobin 15.3 (H) 12.0 - 15.0  g/dL   HCT 45.6 36.0 - 46.0 %   MCV 88.9 78.0 - 100.0 fL   MCH 29.8 26.0 - 34.0 pg   MCHC 33.6 30.0 - 36.0 g/dL   RDW 13.0 11.5 - 15.5 %   Platelets 174 150 - 400 K/uL    Comment: Performed at [facility], [place] [address], [place], [place] [address]  Differential     Status: Abnormal   Collection Time: 09/16/2017  5:59 PM  Result Value Ref Range   Neutrophils Relative % 78 %   Neutro Abs 8.0 (H) 1.7 - 7.7 K/uL   Lymphocytes Relative 14 %   Lymphs Abs 1.4 0.7 - 4.0 K/uL   Monocytes Relative 8 %   Monocytes Absolute 0.9 0.1 - 1.0 K/uL   Eosinophils Relative 0 %   Eosinophils Absolute 0.0 0.0 - 0.7 K/uL   Basophils Relative 0 %   Basophils Absolute 0.0 0.0 - 0.1 K/uL    Comment: Performed at [facility] [address] [place], [place] [address]  Comprehensive metabolic panel     Status: Abnormal   Collection Time: 08/28/2017  5:59 PM  Result Value Ref Range   Sodium 135 135 - 145 mmol/L   Potassium 4.1 3.5 - 5.1 mmol/L   Chloride 98 (L) 101 - 111 mmol/L   CO2 26 22 - 32 mmol/L   Glucose, Bld 109 (H) 65 - 99 mg/dL   BUN 11 6 - 20 mg/dL   Creatinine, Ser 1.08 (H) 0.44 - 1.00 mg/dL   Calcium 9.5 8.9 - 10.3 mg/dL   Total Protein 7.4 6.5 - 8.1 g/dL   Albumin 3.3 (L) 3.5 - 5.0 g/dL   AST 53 (H) 15 - 41 U/L   ALT 25 14 - 54 U/L   Alkaline Phosphatase 198 (H) 38 - 126 U/L   Total Bilirubin 0.9 0.3 - 1.2 mg/dL   GFR calc non Af Amer 46 (L) >60 mL/min   GFR calc Af Amer 54 (L) >60 mL/min    Comment: (NOTE) The eGFR has been calculated using the CKD EPI equation. This calculation has not been validated in all clinical situations. eGFR's persistently <60 mL/min signify possible Chronic Kidney Disease.    Anion gap 11 5 - 15    Comment: Performed at [facility] [address], [place], [place] [address]   Dg Chest Port 1 View  Result Date: 09/04/2017 CLINICAL DATA:  84 year old female code stroke. EXAM: PORTABLE CHEST 1 VIEW COMPARISON:  Chest radiographs  05/12/2016 and earlier. FINDINGS: Portable AP semi upright view at 1811 hours. Stable lung volumes. Stable cardiomegaly and mediastinal contours. Chronic left chest cardiac pacemaker. Allowing for portable technique the lungs are clear. No pneumothorax or pleural effusion. No acute osseous abnormality identified. IMPRESSION: No acute cardiopulmonary abnormality. Electronically Signed   By: Genevie Ann M.D.   On: 08/24/2017 18:27   Ct Head Code Stroke Wo Contrast  Result Date: 09/13/2017 CLINICAL DATA:  Code stroke.  Left-sided deficits EXAM: CT HEAD WITHOUT CONTRAST TECHNIQUE: Contiguous axial images were obtained from the base of the skull through the vertex without intravenous contrast. COMPARISON:  Head CT 03/03/2014 FINDINGS: Brain: No mass lesion or acute hemorrhage. There is a large amount of hypoattenuation throughout the right MCA territory with mild mass effect on the right lateral ventricle. No acute hemorrhage. There is an old left basal ganglia lacunar infarct. No hydrocephalus. Old right cerebellar infarcts are unchanged. Vascular: No hyperdense vessel. No advanced atherosclerotic calcification of the arteries at the skull base. Skull: Normal visualized skull base, calvarium and extracranial soft tissues. Sinuses/Orbits: Fluid in the left sphenoid sinus.  Normal orbits. ASPECTS [facility] Stroke Program Early CT Score) - Ganglionic level infarction (caudate, lentiform nuclei, internal capsule, insula, M1-M3 cortex): 0 - Supraganglionic infarction (M4-M6 cortex): 0 Total score (0-10 with 10 being normal): 0 IMPRESSION: 1. Nonhemorrhagic acute to early subacute infarct of the right MCA territory. 2. ASPECTS is zero. These results  were communicated to Dr. Kerney Elbe at 6:07 pm on 08/21/2017 by text page via the [facility] messaging system. Electronically Signed   By: Ulyses Jarred M.D.   On: 09/18/2017 18:07    Assessment: 84 y.o. female with subacute large right MCA territory ischemic infarction 1. CT as  above with dense right MCA sign and preservation of right ACA territory 2. At significant risk for malignant edema with brain herniation and death 3. Stroke Risk Factors - prior stroke, HTN, hypercholesterolemia 4. Unable to perform MRI due to pacemaker  Plan: 1. Will need to be admitted to the ICU under CCM.  2. Frequent neuro checks 3. No anticoagulation due to high risk for hemorrhagic conversion of the stroke 4. Continue Plavix. Benefits regarding secondary stroke prevention outweigh risks of hemorrhagic conversion of the subacute stroke 5. BP management. Out of permissive HTN time window. SBP goal of 140-160  6. HgbA1c, fasting lipid panel 7. Consider TTE as well as CTA head and neck, but unlikely to change management 8. Start hypertonic 3% saline via peripheral IV at 50 cc/hr. Place central line in case a higher rate will be needed. Check Na level at regular intervals per order set.  9. PT consult, OT consult, Speech consult 10. Repeat CT head in 12 hours to monitor for possible initial radiological signs of herniation 11. Telemetry monitoring  @Electronically  signed: Dr. Kerney Elbe 09/19/2017, 7:34 PM

## 2017-08-29 NOTE — Consult Note (Signed)
Name: KALIMA SAYLOR MRN: [id number] DOB: 04/27/1933    ADMISSION DATE:  Sep 24, 2017 CONSULTATION DATE:  24-Sep-2017  REFERRING MD :  Otelia Limes  CHIEF COMPLAINT:  Left hemiplegia   HISTORY OF PRESENT ILLNESS:  Pt is encephelopathic; therefore, this HPI is obtained from chart review.  Elvy LAGINA READER is a 84 y.o. female with a PMH as outlined below.  She presented to [facility] ED 4/9 after being found by neighbor laying in her bed immobile.  She was last seen normal at 6pm the day prior.  On EMS arrival, she had left hemiplegia, left facial droop, right gaze deviation.  She has a hx of prior stroke with residual right sided weakness.  She takes plavix but is not on any additional anticoagulants or antiplatelets.  In ED, CT head demonstrated a right MCA infarct.  She was evaluated by neurology who recommended 3% saline.  PAST MEDICAL HISTORY :   has a past medical history of Acid reflux, Anemia (1957), Anxiety, Chronic back pain, COPD (chronic obstructive pulmonary disease) (H[facility]), Degenerative disc disease, cervical, Dysrhythmia, Exertional shortness of breath, High cholesterol, History of blood transfusion, Hypertension, Liver cirrhosis ([facility]), Pacemaker, Medtronic placed 06/01/13 (06/11/2013), Scoliosis, and Stroke (HCC) (05/14/2013).  has a past surgical history that includes Replacement total knee (Right, 2010); Cataract extraction w/ intraocular lens  implant, bilateral (Bilateral, 2006-2007); TEE without cardioversion (N/A, 05/17/2013); Insert / replace / remove pacemaker (06/11/2013); Tonsillectomy (~ 1961); Joint replacement; Pacemaker insertion (06/01/13); and permanent pacemaker insertion (N/A, 06/11/2013). Prior to Admission medications   Medication Sig Start Date End Date Taking? Authorizing Provider  Ascorbic Acid (VITAMIN C) 1000 MG tablet Take 1,000 mg by mouth daily. Reported on 05/11/2015   Yes [provider]  Aspirin-Salicylamide-Caffeine (BC HEADACHE POWDER PO) Take 1 packet by  mouth as needed (for headaches).   Yes [provider]  atorvastatin (LIPITOR) 40 MG tablet TAKE 1 TABLET BY MOUTH DAILY AT 6 PM. Patient taking differently: Take 40 mg by mouth in the evening at 6 PM 07/17/17  Yes Croitoru, Mihai, MD  cetirizine (ZYRTEC) 10 MG tablet TAKE 1 TABLET BY MOUTH DAILY. Patient taking differently: Take 10 mg by mouth once a day 04/27/16  Yes Malva Limes, MD  clopidogrel (PLAVIX) 75 MG tablet TAKE 1 TABLET BY MOUTH DAILY. Patient taking differently: Take 75 mg by mouth once a day 03/08/17  Yes Croitoru, Mihai, MD  diazepam (VALIUM) 5 MG tablet Take 0.5 tablets (2.5 mg total) by mouth every 12 (twelve) hours as needed for anxiety. Patient taking differently: Take 2.5 mg by mouth 2 (two) times daily.  10/23/15  Yes Lorie Phenix, MD  furosemide (LASIX) 40 MG tablet TAKE 1 TABLET BY MOUTH DAILY AS NEEDED. Patient taking differently: Take 40 mg by mouth once a day 03/08/17  Yes Croitoru, Mihai, MD  Ginkgo Biloba (GINKOBA PO) Take 1 tablet by mouth daily.   Yes [provider]  metoprolol succinate (TOPROL-XL) 50 MG 24 hr tablet TAKE 1 AND 1/2 TABLETS BY MOUTH ONCE DAILY WITH FOOD. Patient taking differently: Take 75 mg by mouth daily.  05/31/17  Yes Croitoru, Mihai, MD  Multiple Vitamins-Minerals (ONE-A-DAY WOMENS 50 PLUS PO) Take 1 tablet by mouth daily.   Yes [provider]  omeprazole (PRILOSEC) 40 MG capsule Take 1 capsule (40 mg total) by mouth daily. 09/28/15  Yes Lorie Phenix, MD  oxyCODONE (OXY IR/ROXICODONE) 5 MG immediate release tablet Take 5 mg by mouth every 6 (six) hours as needed (for pain).  Yes [provider]  polyethylene glycol powder (GLYCOLAX/MIRALAX) powder Take 17 g by mouth daily as needed for mild constipation.   Yes [provider]  traZODone (DESYREL) 150 MG tablet TAKE 1/2 TABLET BY MOUTH DAILY. Patient taking differently: Take 75 mg by mouth in the evening 08/10/15  Yes Lorie Phenix, MD   ketoconazole (NIZORAL) 2 % cream Apply 1 application topically daily. Patient not taking: Reported on 09/01/2017 01/16/16   Ethelda Chick, MD  sodium chloride (OCEAN) 0.65 % SOLN nasal spray Place 1 spray into both nostrils as needed for congestion. Patient not taking: Reported on 08/23/2017 05/03/16   Jaynie Crumble, PA-C   Allergies  Allergen Reactions  . Aspirin Other (See Comments)    Liver function; cirrhosis  . Tylenol [Acetaminophen] Other (See Comments)    Liver function; cirrhosis   . Other Other (See Comments)    No red meat because of cirrhosis and heart  . Penicillins Rash    Has patient had a PCN reaction causing immediate rash, facial/tongue/throat swelling, SOB or lightheadedness with hypotension: Yes Has patient had a PCN reaction causing severe rash involving mucus membranes or skin necrosis: Unk Has patient had a PCN reaction that required hospitalization: Unk Has patient had a PCN reaction occurring within the last 10 years: No If all of the above answers are "NO", then may proceed with Cephalosporin use.     FAMILY HISTORY:  family history includes Dementia (age of onset: 60) in her brother; Heart disease in her mother; Hypertension in her sister. SOCIAL HISTORY:  reports that she has never smoked. She has never used smokeless tobacco. She reports that she does not drink alcohol or use drugs.  REVIEW OF SYSTEMS: Unable to obtain as pt is encephalopathic.   SUBJECTIVE:   VITAL SIGNS: Pulse Rate:  [64-66] 64 (04/09 1945) Resp:  [13-26] 26 (04/09 1945) BP: (114-152)/(48-101) 136/85 (04/09 1945) SpO2:  [97 %-100 %] 97 % (04/09 1945)  PHYSICAL EXAMINATION: General: Adult female, resting in bed, in NAD. Neuro: Awake but has left sided hemiplegia and left facial droop. HEENT: [place]/AT. EOMI, sclerae anicteric. Cardiovascular: RRR, no M/R/G.  Lungs: Respirations even and unlabored.  CTA bilaterally, No W/R/R. Abdomen: BS x 4, soft, NT/ND.  Musculoskeletal:  No gross deformities, 2+ edema.  Skin: Intact, warm, no rashes.   Recent Labs  Lab 09/02/2017 1751 08/22/2017 1759  NA 138 135  K 4.2 4.1  CL 98* 98*  CO2  --  26  BUN 12 11  CREATININE 1.00 1.08*  GLUCOSE 112* 109*   Recent Labs  Lab 09/04/2017 1751 09/19/2017 1759  HGB 15.6* 15.3*  HCT 46.0 45.6  WBC  --  10.3  PLT  --  174   Dg Chest Port 1 View  Result Date: 08/28/2017 CLINICAL DATA:  84 year old female code stroke. EXAM: PORTABLE CHEST 1 VIEW COMPARISON:  Chest radiographs 05/12/2016 and earlier. FINDINGS: Portable AP semi upright view at 1811 hours. Stable lung volumes. Stable cardiomegaly and mediastinal contours. Chronic left chest cardiac pacemaker. Allowing for portable technique the lungs are clear. No pneumothorax or pleural effusion. No acute osseous abnormality identified. IMPRESSION: No acute cardiopulmonary abnormality. Electronically Signed   By: Odessa Fleming M.D.   On: 09/07/2017 18:27   Ct Head Code Stroke Wo Contrast  Result Date: 08/26/2017 CLINICAL DATA:  Code stroke.  Left-sided deficits EXAM: CT HEAD WITHOUT CONTRAST TECHNIQUE: Contiguous axial images were obtained from the base of the skull through the vertex without intravenous contrast.  COMPARISON:  Head CT 03/03/2014 FINDINGS: Brain: No mass lesion or acute hemorrhage. There is a large amount of hypoattenuation throughout the right MCA territory with mild mass effect on the right lateral ventricle. No acute hemorrhage. There is an old left basal ganglia lacunar infarct. No hydrocephalus. Old right cerebellar infarcts are unchanged. Vascular: No hyperdense vessel. No advanced atherosclerotic calcification of the arteries at the skull base. Skull: Normal visualized skull base, calvarium and extracranial soft tissues. Sinuses/Orbits: Fluid in the left sphenoid sinus.  Normal orbits. ASPECTS [facility] Stroke Program Early CT Score) - Ganglionic level infarction (caudate, lentiform nuclei, internal capsule, insula, M1-M3  cortex): 0 - Supraganglionic infarction (M4-M6 cortex): 0 Total score (0-10 with 10 being normal): 0 IMPRESSION: 1. Nonhemorrhagic acute to early subacute infarct of the right MCA territory. 2. ASPECTS is zero. These results were communicated to Dr. Caryl PinaEric Lindzen at 6:07 pm on 08/25/2017 by text page via the [facility] messaging system. Electronically Signed   By: Deatra RobinsonKevin  Herman M.D.   On: 08/23/2017 18:07    STUDIES:  CT head 4/9 > nonhemorrhagic right MCA infarct.  SIGNIFICANT EVENTS  4/9 > admit.  ASSESSMENT / PLAN:  Large right territory MCA infarct - has left sided deficits but able to protect airway well at this point. Plan: Neurology managing, stroke workup / management per them. Continue 3% saline per neuro, no CVL needed unless infusion rate exceeds 75cc/hr. Swallow eval. F/u on repeat CT head (no MRI due to pacemaker), echo.  Hx HTN, HLD. Plan: Await swallow eval before resuming preadmission atorvastatin, clopidogrel, furosemide, toprol-xl.  If fails swallow eval, can place Cortrak. PRN hydralazine for SBP > 160.   Nothing further to add from PCCM standpoint. PCCM will sign off.  Please do not hesitate to call us back if we can be of any further assistance.   Rutherford Guysahul Desai, GeorgiaPA - C  Pulmonary & Critical Care Medicine Pager: [phone]  or [phone] 08/25/2017, 8:19 PM

## 2017-08-29 NOTE — Progress Notes (Signed)
Sat with patient and friend in ED room.  Family left earlier and friend left late.  I had prayer with them during our waiting time. Phebe CollaDonna S Chasten Blaze, Chaplain   08/23/2017 2300  Clinical Encounter Type  Visited With Patient;Other (Comment) (and neighbor/friend Jamesetta SoPhyllis)  Visit Type Initial;Psychological support;ED  Referral From Other (Comment) (chaplain friend request)  Consult/Referral To Chaplain  Spiritual Encounters  Spiritual Needs Prayer;Emotional

## 2017-08-29 NOTE — ED Provider Notes (Signed)
[facility] EMERGENCY DEPARTMENT Provider Note   CSN: [id number] Arrival date & time: 08/26/2017  1743     History   Chief Complaint Chief Complaint  Patient presents with  . Code Stroke    HPI Marthe Dant Locust is a 84 y.o. female.  The history is provided by the patient and the EMS personnel. No language interpreter was used.    Seynabou ANGELYSE HESLIN is a 84 y.o. female who presents to the Emergency Department complaining of CVA.  Level V caveat due to AMS.  Hx is provided by EMS. Per EMS patient was last known well last night at 6 PM by neighbor. Family went to check on her today and found her in the bed not moving her left side and Naima one was called. She reports headache and is unable to provide additional history.  Past Medical History:  Diagnosis Date  . Acid reflux   . Anemia 1957  . Anxiety   . Chronic back pain   . COPD (chronic obstructive pulmonary disease) (HCC)   . Degenerative disc disease, cervical   . Dysrhythmia    "skips" (06/12/2013)  . Exertional shortness of breath    "recently" (06/12/2013)  . High cholesterol   . History of blood transfusion    "think it was when I had knee OR" (06/12/2013)  . Hypertension   . Liver cirrhosis (HCC)   . Pacemaker, Medtronic placed 06/01/13 06/11/2013  . Scoliosis   . Stroke (HCC) 05/14/2013   denies residual on 06/12/2013    Patient Active Problem List   Diagnosis Date Noted  . Acute right arterial ischemic stroke, middle cerebral artery (MCA) (HCC) 09/02/2017  . Left hemiparesis (HCC)   . Adaptation reaction 04/08/2015  . Allergic rhinitis 04/08/2015  . Blocked tear duct 04/08/2015  . CN (constipation) 04/08/2015  . Cerebral infarct (HCC) 04/08/2015  . Ear ache 04/08/2015  . Edema extremities 04/08/2015  . Fall 04/08/2015  . Fatigue 04/08/2015  . Coccygeal fracture (HCC) 04/08/2015  . Acid reflux 04/08/2015  . HLD (hyperlipidemia) 04/08/2015  . Hypomagnesemia 04/08/2015  . Below normal  amount of sodium in the blood 04/08/2015  . Cannot sleep 04/08/2015  . Arthritis, degenerative 04/08/2015  . Osteopenia 04/08/2015  . Pleurisy 04/08/2015  . Cirrhosis, primary biliary 04/08/2015  . Bilateral leg edema 12/24/2013  . Pacemaker, Medtronic placed 06/11/13 06/11/2013  . Tachy-brady syndrome (HCC) 05/27/2013  . Paroxysmal atrial fibrillation, has not been felt to be anticoagulation candidate secondary to freq falls and biliary cirhosis   05/27/2013  . Sinus pause 05/27/2013  . Elevated troponin- negative Myoview, Nl LVF 05/24/2013  . Demand ischemia (HCC) 05/19/2013  . Acute ischemic right MCA stroke (HCC) 05/14/2013  . Altered mental status 05/14/2013  . LEG EDEMA, BILATERAL 09/07/2010  . NONSPEC ELEVATION OF LEVELS OF TRANSAMINASE/LDH 03/19/2010  . CHEST WALL PAIN, ANTERIOR 02/26/2009  . UNSPECIFIED IRON DEFICIENCY ANEMIA 08/25/2008  . Hypertension 08/25/2008  . Esophageal reflux 08/25/2008  . NAUSEA ALONE 08/25/2008    Past Surgical History:  Procedure Laterality Date  . CATARACT EXTRACTION W/ INTRAOCULAR LENS  IMPLANT, BILATERAL Bilateral 2006-2007  . INSERT / REPLACE / REMOVE PACEMAKER  06/11/2013  . JOINT REPLACEMENT    . PACEMAKER INSERTION  06/01/13   Dr. Royann Shivers Medtronic device  . PERMANENT PACEMAKER INSERTION N/A 06/11/2013   Procedure: PERMANENT PACEMAKER INSERTION;  Surgeon: Thurmon Fair, MD;  Location: [facility] CATH LAB;  Service: Cardiovascular;  Laterality: N/A;  . REPLACEMENT TOTAL KNEE Right  2010  . TEE WITHOUT CARDIOVERSION N/A 05/17/2013   Procedure: TRANSESOPHAGEAL ECHOCARDIOGRAM (TEE);  Surgeon: Vesta MixerPhilip J Nahser, MD;  [facility];  Service: Cardiovascular;  Laterality: N/A;  . TONSILLECTOMY  ~ 1961     OB History   None      Home Medications    Prior to Admission medications   Medication Sig Start Date End Date Taking? Authorizing Provider  Ascorbic Acid (VITAMIN C) 1000 MG tablet Take 1,000 mg by mouth daily. Reported on  05/11/2015   Yes [provider]  Aspirin-Salicylamide-Caffeine (BC HEADACHE POWDER PO) Take 1 packet by mouth as needed (for headaches).   Yes [provider]  atorvastatin (LIPITOR) 40 MG tablet TAKE 1 TABLET BY MOUTH DAILY AT 6 PM. Patient taking differently: Take 40 mg by mouth in the evening at 6 PM 07/17/17  Yes Croitoru, Mihai, MD  cetirizine (ZYRTEC) 10 MG tablet TAKE 1 TABLET BY MOUTH DAILY. Patient taking differently: Take 10 mg by mouth once a day 04/27/16  Yes Malva LimesFisher, Donald E, MD  clopidogrel (PLAVIX) 75 MG tablet TAKE 1 TABLET BY MOUTH DAILY. Patient taking differently: Take 75 mg by mouth once a day 03/08/17  Yes Croitoru, Mihai, MD  diazepam (VALIUM) 5 MG tablet Take 0.5 tablets (2.5 mg total) by mouth every 12 (twelve) hours as needed for anxiety. Patient taking differently: Take 2.5 mg by mouth 2 (two) times daily.  10/23/15  Yes Lorie PhenixMaloney, Nancy, MD  furosemide (LASIX) 40 MG tablet TAKE 1 TABLET BY MOUTH DAILY AS NEEDED. Patient taking differently: Take 40 mg by mouth once a day 03/08/17  Yes Croitoru, Mihai, MD  Ginkgo Biloba (GINKOBA PO) Take 1 tablet by mouth daily.   Yes [provider]  metoprolol succinate (TOPROL-XL) 50 MG 24 hr tablet TAKE 1 AND 1/2 TABLETS BY MOUTH ONCE DAILY WITH FOOD. Patient taking differently: Take 75 mg by mouth daily.  05/31/17  Yes Croitoru, Mihai, MD  Multiple Vitamins-Minerals (ONE-A-DAY WOMENS 50 PLUS PO) Take 1 tablet by mouth daily.   Yes [provider]  omeprazole (PRILOSEC) 40 MG capsule Take 1 capsule (40 mg total) by mouth daily. 09/28/15  Yes Lorie PhenixMaloney, Nancy, MD  oxyCODONE (OXY IR/ROXICODONE) 5 MG immediate release tablet Take 5 mg by mouth every 6 (six) hours as needed (for pain).   Yes [provider]  polyethylene glycol powder (GLYCOLAX/MIRALAX) powder Take 17 g by mouth daily as needed for mild constipation.   Yes [provider]  traZODone (DESYREL) 150 MG tablet TAKE 1/2 TABLET BY MOUTH  DAILY. Patient taking differently: Take 75 mg by mouth in the evening 08/10/15  Yes Lorie PhenixMaloney, Nancy, MD  ketoconazole (NIZORAL) 2 % cream Apply 1 application topically daily. Patient not taking: Reported on 09/04/2017 01/16/16   Ethelda ChickSmith, Kristi M, MD  sodium chloride (OCEAN) 0.65 % SOLN nasal spray Place 1 spray into both nostrils as needed for congestion. Patient not taking: Reported on 08/21/2017 05/03/16   Jaynie CrumbleKirichenko, Tatyana, PA-C    Family History Family History  Problem Relation Age of Onset  . Heart disease Mother   . Hypertension Sister   . Dementia Brother [id number]    Social History Social History   Tobacco Use  . Smoking status: Never Smoker  . Smokeless tobacco: Never Used  Substance Use Topics  . Alcohol use: No  . Drug use: No     Allergies   Aspirin; Tylenol [acetaminophen]; Other; and Penicillins   Review of Systems Review of Systems  Unable  to perform ROS: Mental status change     Physical Exam Updated Vital Signs BP (!) 147/54   Pulse 66   Temp (!) 100.8 F (38.2 C) (Rectal)   Resp (!) 24   SpO2 99%   Physical Exam  Constitutional: She appears well-developed and well-nourished.  HENT:  Head: Normocephalic and atraumatic.  Cardiovascular: Normal rate and regular rhythm.  No murmur heard. Pulmonary/Chest: Effort normal and breath sounds normal. No respiratory distress.  Abdominal: Soft. There is no tenderness. There is no rebound and no guarding.  Musculoskeletal: She exhibits no tenderness.  3+ pitting edema to bilateral lower extremities.  Neurological: She is alert.  Gaze preference to the right. Left hemiparesis. Disoriented to place in time.  Skin: Skin is warm and dry.  Psychiatric: She has a normal mood and affect. Her behavior is normal.  Nursing note and vitals reviewed.    ED Treatments / Results  Labs (all labs ordered are listed, but only abnormal results are displayed) Labs Reviewed  CBC - Abnormal; Notable for the following  components:      Result Value   RBC 5.13 (*)    Hemoglobin 15.3 (*)    All other components within normal limits  DIFFERENTIAL - Abnormal; Notable for the following components:   Neutro Abs 8.0 (*)    All other components within normal limits  COMPREHENSIVE METABOLIC PANEL - Abnormal; Notable for the following components:   Chloride 98 (*)    Glucose, Bld 109 (*)    Creatinine, Ser 1.08 (*)    Albumin 3.3 (*)    AST 53 (*)    Alkaline Phosphatase 198 (*)    GFR calc non Af Amer 46 (*)    GFR calc Af Amer 54 (*)    All other components within normal limits  CBG MONITORING, ED - Abnormal; Notable for the following components:   Glucose-Capillary 103 (*)    All other components within normal limits  I-STAT CHEM 8, ED - Abnormal; Notable for the following components:   Chloride 98 (*)    Glucose, Bld 112 (*)    Calcium, Ion 1.12 (*)    Hemoglobin 15.6 (*)    All other components within normal limits  PROTIME-INR  APTT  SODIUM  SODIUM  SODIUM  HEMOGLOBIN A1C  LIPID PANEL  SODIUM  SODIUM  SODIUM  CBC  CREATININE, SERUM  SODIUM  SODIUM  SODIUM  SODIUM  I-STAT TROPONIN, ED    EKG None  Radiology Dg Chest Port 1 View  Result Date: 09/08/2017 CLINICAL DATA:  84 year old female code stroke. EXAM: PORTABLE CHEST 1 VIEW COMPARISON:  Chest radiographs 05/12/2016 and earlier. FINDINGS: Portable AP semi upright view at 1811 hours. Stable lung volumes. Stable cardiomegaly and mediastinal contours. Chronic left chest cardiac pacemaker. Allowing for portable technique the lungs are clear. No pneumothorax or pleural effusion. No acute osseous abnormality identified. IMPRESSION: No acute cardiopulmonary abnormality. Electronically Signed   By: Odessa Fleming M.D.   On: 09/02/2017 18:27   Ct Head Code Stroke Wo Contrast  Result Date: 09/10/2017 CLINICAL DATA:  Code stroke.  Left-sided deficits EXAM: CT HEAD WITHOUT CONTRAST TECHNIQUE: Contiguous axial images were obtained from the base of  the skull through the vertex without intravenous contrast. COMPARISON:  Head CT 03/03/2014 FINDINGS: Brain: No mass lesion or acute hemorrhage. There is a large amount of hypoattenuation throughout the right MCA territory with mild mass effect on the right lateral ventricle. No acute hemorrhage. There is an old  left basal ganglia lacunar infarct. No hydrocephalus. Old right cerebellar infarcts are unchanged. Vascular: No hyperdense vessel. No advanced atherosclerotic calcification of the arteries at the skull base. Skull: Normal visualized skull base, calvarium and extracranial soft tissues. Sinuses/Orbits: Fluid in the left sphenoid sinus.  Normal orbits. ASPECTS [facility] Stroke Program Early CT Score) - Ganglionic level infarction (caudate, lentiform nuclei, internal capsule, insula, M1-M3 cortex): 0 - Supraganglionic infarction (M4-M6 cortex): 0 Total score (0-10 with 10 being normal): 0 IMPRESSION: 1. Nonhemorrhagic acute to early subacute infarct of the right MCA territory. 2. ASPECTS is zero. These results were communicated to Dr. Caryl Pina at 6:07 pm on 08/22/2017 by text page via the [facility] messaging system. Electronically Signed   By: Deatra Robinson M.D.   On: 09/08/2017 18:07    Procedures Procedures (including critical care time) CRITICAL CARE Performed by: Tilden Fossa   Total critical care time: 45 minutes  Critical care time was exclusive of separately billable procedures and treating other patients.  Critical care was necessary to treat or prevent imminent or life-threatening deterioration.  Critical care was time spent personally by me on the following activities: development of treatment plan with patient and/or surrogate as well as nursing, discussions with consultants, evaluation of patient's response to treatment, examination of patient, obtaining history from patient or surrogate, ordering and performing treatments and interventions, ordering and review of laboratory studies,  ordering and review of radiographic studies, pulse oximetry and re-evaluation of patient's condition.  Medications Ordered in ED Medications  hydrALAZINE (APRESOLINE) injection 10 mg (has no administration in time range)   stroke: mapping our early stages of recovery book ( Does not apply Not Given 09/04/2017 2346)  senna-docusate (Senokot-S) tablet 1 tablet (has no administration in time range)  heparin injection 5,000 Units (has no administration in time range)  sodium chloride (hypertonic) 3 % solution (50 mL/hr Intravenous New Bag/Given 08/28/2017 2341)  iopamidol (ISOVUE-370) 76 % injection (  Contrast Given 09/01/2017 1800)     Initial Impression / Assessment and Plan / ED Course  I have reviewed the triage vital signs and the nursing notes.  Pertinent labs & imaging results that were available during my care of the patient were reviewed by me and considered in my medical decision making (see chart for details).     Patient presented to the emergency department as a code stroke for left hemiparesis. She has an acute MCA infarction on imaging that is consistent with her examination. She is not a TPA candidate given duration of symptoms. She was evaluated by neurology in the emergency department with plan for admission for hypertonic saline and continued stroke care. ICU in medicine consulted for admission - both services states they are unable to admit patients with acute stroke requiring hypertonic saline. Neurology re-consulted for admission. Patient and family updated findings of studies and recommendation for admission for further treatment and they are in agreement with plan.  Final Clinical Impressions(s) / ED Diagnoses   Final diagnoses:  Stroke (cerebrum) [facility])    ED Discharge Orders    None       Tilden Fossa, MD 08/30/17 [phone]

## 2017-08-29 NOTE — ED Triage Notes (Signed)
Pt arrived via [facility] ems from home after a concerned neighbor found pt immobile and laying on her bed. LSN was yesterday at 18:00. Pt had notable weakness to her left side, right sided gaze, left sided facial paralysis. Pt is able to answer questions but unable to clearly state her answers.

## 2017-08-30 ENCOUNTER — Inpatient Hospital Stay (HOSPITAL_COMMUNITY): Payer: PPO

## 2017-08-30 ENCOUNTER — Other Ambulatory Visit: Payer: Self-pay

## 2017-08-30 ENCOUNTER — Inpatient Hospital Stay: Payer: Self-pay

## 2017-08-30 DIAGNOSIS — I10 Essential (primary) hypertension: Secondary | ICD-10-CM

## 2017-08-30 DIAGNOSIS — I480 Paroxysmal atrial fibrillation: Secondary | ICD-10-CM

## 2017-08-30 DIAGNOSIS — I361 Nonrheumatic tricuspid (valve) insufficiency: Secondary | ICD-10-CM

## 2017-08-30 DIAGNOSIS — I340 Nonrheumatic mitral (valve) insufficiency: Secondary | ICD-10-CM

## 2017-08-30 DIAGNOSIS — Z950 Presence of cardiac pacemaker: Secondary | ICD-10-CM

## 2017-08-30 DIAGNOSIS — I6521 Occlusion and stenosis of right carotid artery: Secondary | ICD-10-CM

## 2017-08-30 DIAGNOSIS — E785 Hyperlipidemia, unspecified: Secondary | ICD-10-CM

## 2017-08-30 LAB — ECHOCARDIOGRAM COMPLETE
HEIGHTINCHES: 59 in
Weight: 2522.06 oz

## 2017-08-30 LAB — CBC
HCT: 40.8 % (ref 36.0–46.0)
HEMOGLOBIN: 13.4 g/dL (ref 12.0–15.0)
MCH: 30 pg (ref 26.0–34.0)
MCHC: 32.8 g/dL (ref 30.0–36.0)
MCV: 91.5 fL (ref 78.0–100.0)
PLATELETS: 192 10*3/uL (ref 150–400)
RBC: 4.46 MIL/uL (ref 3.87–5.11)
RDW: 13.5 % (ref 11.5–15.5)
WBC: 10.8 10*3/uL — ABNORMAL HIGH (ref 4.0–10.5)

## 2017-08-30 LAB — LIPID PANEL
CHOL/HDL RATIO: 3.1 ratio
CHOLESTEROL: 108 mg/dL (ref 0–200)
HDL: 35 mg/dL — AB (ref >40)
LDL Cholesterol: 59 mg/dL (ref 0–99)
Triglycerides: 69 mg/dL (ref <150)
VLDL: 14 mg/dL (ref 0–40)

## 2017-08-30 LAB — MRSA PCR SCREENING: MRSA by PCR: NEGATIVE

## 2017-08-30 LAB — CREATININE, SERUM
CREATININE: 1.03 mg/dL — AB (ref 0.44–1.00)
GFR calc Af Amer: 57 mL/min — ABNORMAL LOW (ref >60)
GFR, EST NON AFRICAN AMERICAN: 49 mL/min — AB (ref >60)

## 2017-08-30 LAB — SODIUM
SODIUM: 139 mmol/L (ref 135–145)
SODIUM: 144 mmol/L (ref 135–145)
SODIUM: 149 mmol/L — AB (ref 135–145)
SODIUM: 149 mmol/L — AB (ref 135–145)
Sodium: 144 mmol/L (ref 135–145)

## 2017-08-30 LAB — HEMOGLOBIN A1C
HEMOGLOBIN A1C: 5.3 % (ref 4.8–5.6)
MEAN PLASMA GLUCOSE: 105.41 mg/dL

## 2017-08-30 MED ORDER — CHLORHEXIDINE GLUCONATE 0.12 % MT SOLN
15.0000 mL | Freq: Two times a day (BID) | OROMUCOSAL | Status: DC
  Administered 2017-08-30 – 2017-09-02 (×7): 15 mL via OROMUCOSAL
  Filled 2017-08-30 (×3): qty 15

## 2017-08-30 MED ORDER — ASPIRIN 300 MG RE SUPP
300.0000 mg | Freq: Every day | RECTAL | Status: DC
  Administered 2017-08-30 – 2017-09-02 (×4): 300 mg via RECTAL
  Filled 2017-08-30 (×4): qty 1

## 2017-08-30 MED ORDER — ORAL CARE MOUTH RINSE
15.0000 mL | Freq: Two times a day (BID) | OROMUCOSAL | Status: DC
  Administered 2017-08-30 – 2017-09-02 (×9): 15 mL via OROMUCOSAL

## 2017-08-30 MED ORDER — ACETAMINOPHEN 650 MG RE SUPP
650.0000 mg | Freq: Three times a day (TID) | RECTAL | Status: AC | PRN
  Administered 2017-08-30 – 2017-08-31 (×3): 650 mg via RECTAL
  Filled 2017-08-30 (×3): qty 1

## 2017-08-30 NOTE — Procedures (Signed)
The patient needed a CVP for 3% NACL. Neurolgy asked me to place a CVP. We obtained formal consent from her family at the bedside. I explained the procedure to them. I covered the major risks of said procedure.  A attempt was made to cannulate the left IJ but it was a small and very collapsible vessel. I than moved to the right groin. The right common femoral vein was visualized. US was used to cannulate the vessel. The line was than placed by modified Seldinger technique other sterile conditions. Blood flow was evident. The CVP was than sutured in place and covered.

## 2017-08-30 NOTE — Progress Notes (Signed)
0510-Patient heart rhythm changed from normal sinus 60s to new onset a-fib heart rate 90s-110s. EKG completed and confirmed new a-fib. Neuro MD made aware of change.   0600-Patient was slightly more lethargic but would still wake up and follow commands and alert and oriented.    0700- shift change with day RN, report given to [place]. Upon assessment, patient much more lethargic. Following commands but only only oriented to self. MD made aware, verbal order to increase 3% to 75mL, STAT head CT and insert PICC line from IV team.

## 2017-08-30 NOTE — Progress Notes (Signed)
SLP Cancellation Note  Patient Details Name: Diamond RuddleClara L Miller MRN: [id number] DOB: Dec 16, 1932   Cancelled treatment:       Reason Eval/Treat Not Completed: Medical issues which prohibited therapy. Large MCA, pt too lethargic for assessment per RN. Will follow for readiness.    Dyland Panuco, Riley NearingBonnie Caroline 08/30/2017, 8:56 AM

## 2017-08-30 NOTE — Progress Notes (Signed)
Sodium 149 - paged Dr. Roda ShuttersXu. Verbal to leave 3% at 6075ml/hr.

## 2017-08-30 NOTE — Progress Notes (Signed)
Echocardiogram 2D Echocardiogram has been performed.  Dorothey BasemanReel, Audrey Eller M 08/30/2017, 4:09 PM

## 2017-08-30 NOTE — ED Notes (Signed)
MRI to be done tmrw due to pts pacemaker per MRI

## 2017-08-30 NOTE — Progress Notes (Addendum)
11:30 am Vascular ultrasound test not completed. Patient is in MRI. Will check back later for Carotid duplex. 3:00 pm patient having procedure in room. Will check back 4/11  Farrel DemarkJill Eunice, RDMS, RVT

## 2017-08-30 NOTE — Progress Notes (Signed)
PT Cancellation Note  Patient Details Name: Diamond RuddleClara L Hinds MRN: [id number] DOB: 1933-04-24   Cancelled Treatment:    Reason Eval/Treat Not Completed: Patient not medically ready; per RN too lethargic for PT assessment.  Will attempt another day.   Elray McgregorCynthia Wynn 08/30/2017, 12:53 PM  Sheran Lawlessyndi Wynn, PT [phone] 08/30/2017

## 2017-08-30 NOTE — ED Notes (Signed)
Patient transported to CT 

## 2017-08-30 NOTE — Progress Notes (Signed)
Initial Nutrition Assessment  DOCUMENTATION CODES:   Obesity unspecified  INTERVENTION:   - If diet is advanced, recommend Ensure Enlive po BID, each supplement provides 350 kcal and 20 grams of protein  - If pt remains NPO, recommend initiating enteral nutrition via Cortrak tube: Vital AF 1.2 @ 55 ml/hr (1320 ml/day).  Recommended TF regimen provides 584 kcal (100% estimated energy needs), 99 grams protein (100% estimated protein needs), and 1069 ml free water.  NUTRITION DIAGNOSIS:   Inadequate oral intake related to inability to eat as evidenced by NPO status.  GOAL:   Patient will meet greater than or equal to 90% of their needs  MONITOR:   Diet advancement, Labs, Weight trends  REASON FOR ASSESSMENT:   Malnutrition Screening Tool    ASSESSMENT:   84 year old female who presented to the ED with L hemiparesis and was found to have an acute MCA infarction. Pt with PMH significant for anemia, anxiety, COPD, HLD, liver cirrhosis, new onset a-fib, and HTN.  Pt being followed by neurology. Per neurology note, pt likely to require enteral nutrition. RD recommends placement of Cortrak feeding tube if short-term enteral nutrition is warranted.  Spoke with pt's daughter at bedside. Pt's daughter able to provide limited diet history as daughter does not see pt daily. Pt's daughter states that on Sunday, she took pt to [employer]. Unable to obtain more detailed diet history. Pt endorsed feeling hungry when RD asked about appetite.  Pt's daughter denies any recent weight loss and is unsure of pt's UBW. Per weight history in chart, pt's weight has fluctuated between 141-164 lbs since December 2016. Pt currently weighs 157 lbs which is at the high end of that range.  Medications reviewed and include: heparin  Labs reviewed: creatinine 1.03 (H), alkaline phosphatase 198, HDL 35 (L), hemoglobin A1C 5.3 (WNL) CBG's: 103  NUTRITION - FOCUSED PHYSICAL EXAM:    Most Recent Value   Orbital Region  Mild depletion  Upper Arm Region  No depletion  Thoracic and Lumbar Region  No depletion  Buccal Region  No depletion  Temple Region  Mild depletion  Clavicle Bone Region  No depletion  Clavicle and Acromion Bone Region  No depletion  Scapular Bone Region  No depletion  Dorsal Hand  No depletion  Patellar Region  No depletion  Anterior Thigh Region  No depletion  Posterior Calf Region  No depletion  Edema (RD Assessment)  Moderate  Hair  Reviewed  Eyes  Unable to assess  Mouth  Unable to assess  Skin  Reviewed  Nails  Reviewed       Diet Order:  Diet NPO time specified Fall precautions  EDUCATION NEEDS:   No education needs have been identified at this time  Skin:  Skin Assessment: Reviewed RN Assessment  Last BM:  08/31/2017  Height:   Ht Readings from Last 1 Encounters:  08/30/17 4\' 11"  (1.499 m)    Weight:   Wt Readings from Last 1 Encounters:  08/30/17 157 lb 10.1 oz (71.5 kg)    Ideal Body Weight:  43.2 kg  BMI:  Body mass index is 31.84 kg/m.  Estimated Nutritional Needs:   Kcal:  1400-1600 kcal/day (MSJ x 1.3-1.5)  Protein:  85-100 grams/day (>2 grams/kg IBW)  Fluid:  1.6-1.8 L/day    Earma ReadingKate Jablonski Zayna Toste, MS, RD, LDN Pager: [phone] Weekend/After Hours: [phone]

## 2017-08-30 NOTE — Progress Notes (Signed)
OT Cancellation    08/30/17 0800  OT Visit Information  Last OT Received On 08/30/17  Reason Eval/Treat Not Completed Patient not medically ready (Decreased arousal per RN. Will return as schedule allows and pt medically stable.)   Haani Bakula MSOT, OTR/L Acute Rehab Pager: [phone] Office: [phone]

## 2017-08-30 NOTE — Progress Notes (Signed)
Patient admitted to primary neurologist service as critical care medicine felt the patient did not have immediate needs.  Hospital medicine team could not admit the patient due to neuro critical care needs. They have stated the patient can be transferred to the service once outside ICU.  Assessment and plan   Right Malignant MCA stroke-likely due to new onset atrial fibrillation CytotoxicCerebral edema  -outside window for TPA and thrombectomy - and Plavix -on 3% hypertonic saline at 50 mL per hour -repeat CT head shows increasing regional mass effect with worsened 4 mm of right-to-left midline shift. - q6 NA checks, goal NA 150-160 - recommend NS consult in the morning, given her age will likely be a poor hemicraniectomy candidate if needed   New onset Afib - noted on EKG - rate controlled, may consider starting metroprolol - [facility] given size of stroke - Has PCM placed in 2015 for history of arrhythmia   Liver cirrhosis - was started on Plavix by Dr Otelia LimesLindzen for stroke prevention  - platelet level normal  - LFT- slightly elevated ALP, AST - appears to be compensated cirrhosis   HTN  Permissive HTN for first 24hrs PRN hydralazine for >185/110  HLD Continue statin  H/o stroke - treatment as above  -COPD  nebs PRN  Diet: NPO until swallow eval, will likely need feeding tube DVT PPX; SCD, sq heparin    This patient is neurologically critically ill due to large Right MCA stroke and multiple comorbidities including Afib, cirrhosis. She is at risk for significant risk of neurological worsening from cerebral edema,  death from brain herniation, heart failure, hemorrhagic conversion, infection, respiratory failure and seizure. This patient's care requires constant monitoring of vital signs, hemodynamics, respiratory and cardiac monitoring, review of multiple databases, neurological assessment, discussion with family, other specialists and medical  decision making of high complexity.  I spent 40 minutes of neurocritical time in the care of this patient.

## 2017-08-30 NOTE — Progress Notes (Signed)
STROKE TEAM PROGRESS NOTE   SUBJECTIVE (INTERVAL HISTORY) Her sister and daughter are at the bedside.  Overall she feels her condition is gradually worsening. Pt very sleepy and drowsy, difficulty open eyes.  Sister said he was talking last night.  Discussed with daughter and sister at bedside, reviewed imaging available, updated patient current condition and treatment option as well as potential prognosis.  Daughter would like to continue aggressive care.  Will do MRI/MRA and central line for 3% saline.  Patient is full code.   OBJECTIVE Temp:  [98.1 F (36.7 C)-100.8 F (38.2 C)] 98.3 F (36.8 C) (04/10 1500) Pulse Rate:  [56-101] 86 (04/10 1500) Cardiac Rhythm: Atrial fibrillation (04/10 0800) Resp:  [13-26] 18 (04/10 1500) BP: (113-153)/(48-101) 132/64 (04/10 1500) SpO2:  [94 %-100 %] 98 % (04/10 1500) Weight:  [157 lb 10.1 oz (71.5 kg)] 157 lb 10.1 oz (71.5 kg) (04/10 0430)  Recent Labs  Lab Sep 01, 2017 1747  GLUCAP 103*   Recent Labs  Lab 09-01-17 1751 09-01-2017 1759 09/01/17 2028 08/30/17 0049 08/30/17 0411 08/30/17 0451 08/30/17 1225  NA 138 135 136 139 144 144 149*  K 4.2 4.1  --   --   --   --   --   CL 98* 98*  --   --   --   --   --   CO2  --  26  --   --   --   --   --   GLUCOSE 112* 109*  --   --   --   --   --   BUN 12 11  --   --   --   --   --   CREATININE 1.00 1.08*  --   --  1.03*  --   --   CALCIUM  --  9.5  --   --   --   --   --    Recent Labs  Lab 09/01/2017 1759  AST 53*  ALT 25  ALKPHOS 198*  BILITOT 0.9  PROT 7.4  ALBUMIN 3.3*   Recent Labs  Lab 2017/09/01 1751 September 01, 2017 1759 08/30/17 0411  WBC  --  10.3 10.8*  NEUTROABS  --  8.0*  --   HGB 15.6* 15.3* 13.4  HCT 46.0 45.6 40.8  MCV  --  88.9 91.5  PLT  --  174 192   No results for input(s): CKTOTAL, CKMB, CKMBINDEX, TROPONINI in the last 168 hours. Recent Labs    September 01, 2017 1759  LABPROT 13.8  INR 1.06   No results for input(s): COLORURINE, LABSPEC, PHURINE, GLUCOSEU, HGBUR,  BILIRUBINUR, KETONESUR, PROTEINUR, UROBILINOGEN, NITRITE, LEUKOCYTESUR in the last 72 hours.  Invalid input(s): APPERANCEUR     Component Value Date/Time   CHOL 108 08/30/2017 0411   TRIG 69 08/30/2017 0411   HDL 35 (L) 08/30/2017 0411   CHOLHDL 3.1 08/30/2017 0411   VLDL 14 08/30/2017 0411   LDLCALC 59 08/30/2017 0411   Lab Results  Component Value Date   HGBA1C 5.3 08/30/2017   No results found for: LABOPIA, COCAINSCRNUR, LABBENZ, AMPHETMU, THCU, LABBARB  No results for input(s): ETH in the last 168 hours.  I have personally reviewed the radiological images below and agree with the radiology interpretations.  Ct Head Wo Contrast  Result Date: 08/30/2017 CLINICAL DATA:  84 year old female with large right MCA infarct. Lethargy, unresponsive. EXAM: CT HEAD WITHOUT CONTRAST TECHNIQUE: Contiguous axial images were obtained from the base of the skull through the vertex without intravenous  contrast. COMPARISON:  Head CT 0354 hours today, 09/19/2017. FINDINGS: Brain: A large area well developed cytotoxic edema occupying the right MCA territory, including the right basal ganglia, and prominently involving right MCA/PCA watershed territory is stable since 0354 hours today. There is trace petechial hemorrhage at the level of the right caudate (series 3, image 17). No hemorrhage related mass effect. Cytotoxic edema related mass effect appears stable with subtotal effacement of the right lateral ventricle but only 3 millimeters of leftward midline shift (also image 17). Basilar cisterns remain patent and are unchanged. No ventriculomegaly. Stable gray-white matter differentiation elsewhere, including patchy white matter hypodensity in the left hemisphere, occasional small left MCA territory cortical encephalomalacia, and chronic small infarcts in the cerebellum. Vascular: Calcified atherosclerosis at the skull base. Asymmetric right MCA hyperdensity re- demonstrated. Skull: No acute osseous abnormality  identified. Sinuses/Orbits: Stable bubbly opacity in the left sphenoid sinus. Other: No acute orbit or scalp soft tissue findings. Small volume retained secretions in the nasopharynx. IMPRESSION: 1. Stable non contrast CT appearance of the brain since 0354 hours today. Large, confluent right MCA territory infarct with cytotoxic edema and trace petechial hemorrhage. 2. Effaced right lateral ventricle, but only 3 mm of leftward midline shift at this time, no ventriculomegaly, and basilar cisterns remain patent. 3. No new intracranial abnormality. Electronically Signed   By: Odessa Fleming M.D.   On: 08/30/2017 08:35   Ct Head Wo Contrast  Result Date: 08/30/2017 CLINICAL DATA:  Follow-up examination for acute stroke. EXAM: CT HEAD WITHOUT CONTRAST TECHNIQUE: Contiguous axial images were obtained from the base of the skull through the vertex without intravenous contrast. COMPARISON:  Prior CT from 08/29/2016. FINDINGS: Brain: Continued interval evolution of large volume subacute right MCA territory infarct, with extensive cytotoxic edema seen throughout the majority of the right MCA territory. Overall size and distribution relatively similar to previous. Increasing mass effect with slightly worsened 4 mm of right-to-left shift. Right lateral ventricle partially attenuated. Basilar cisterns remain patent. Possible scattered areas of faint internal petechial hemorrhage without hemorrhagic transformation. No acute intracranial hemorrhage. No new acute large vessel territory infarct. No mass lesion. No extra-axial fluid collection. Underlying atrophy with chronic small vessel ischemic disease again noted. Remote bilateral cerebellar infarcts noted, right greater than left. Vascular: No new hyperdense vessel. Calcified atherosclerosis noted at the skull base. Skull: Scalp soft tissues and calvarium demonstrate no acute abnormality. Sinuses/Orbits: Globes and orbital soft tissues within normal limits. Mild layering opacity  noted within left sphenoid sinus. Paranasal sinuses are otherwise clear. No mastoid effusion. Other: None. IMPRESSION: 1. Continued interval evolution of large subacute right MCA territory infarct, stable in size and distribution as compared to previous. Increasing regional mass effect with worsened 4 mm of right-to-left midline shift. No evidence for hemorrhagic transformation. 2. Otherwise stable appearance of the brain. No new acute intracranial abnormality identified. Electronically Signed   By: Rise Mu M.D.   On: 08/30/2017 04:34   Mr Brain Wo Contrast  Result Date: 08/30/2017 CLINICAL DATA:  84 year old female with large right MCA infarct. MRI compatible [employer] pacemaker. EXAM: MRI HEAD WITHOUT CONTRAST MRA HEAD WITHOUT CONTRAST TECHNIQUE: Multiplanar, multiecho pulse sequences of the brain and surrounding structures were obtained without intravenous contrast. Angiographic images of the head were obtained using MRA technique without contrast. COMPARISON:  Head CT without contrast 753 hours today and earlier. Brain MRI and intracranial MRA 05/14/2013. FINDINGS: MRI HEAD FINDINGS Brain: Confluent restricted diffusion and cytotoxic edema throughout much of the right hemisphere conforming to  the right MCA territory. There is scattered cortical and subcortical white matter restricted diffusion also along the medial right superior frontal gyrus corresponding to some of the ACA territory (axial diffusion image 82). Dense involvement of the right basal ganglia. Subtotal involvement of the right temporal lobe. Extension two some of the right MCA/PCA watershed territory. Involvement of the right cerebral peduncle. Petechial hemorrhage in the right basal ganglia tracking toward the right temporal stem. Also there does appear to be trace layering hemorrhage in the right occipital horn (SWI image 26). Subtotal effacement of the right lateral ventricle. No ventriculomegaly. Leftward midline shift of 3-4  millimeters. Patent basilar cisterns. In the contralateral left hemisphere there is only a punctate possible area of restricted diffusion in the left corona radiata on diffusion axial image 69. This appears isointense on the ADC. No posterior fossa restricted diffusion. Chronic infarcts in the cerebellar hemispheres, mildly progressed on the left since 2014. Several chronic lacunar infarcts in the left lentiform, some of which were acute on the prior MRI. Associated small areas of cortical encephalomalacia in the left middle frontal gyrus. No other intracranial blood products identified. Cervicomedullary junction and pituitary are within normal limits. Vascular: Abnormal right ICA flow void in the upper neck and throughout the right siphon compatible with slow flow or occlusion (axial T2 image 6). Evidence of reconstituted flow at the right ICA terminus. See MRA findings below. Other Major intracranial vascular flow voids are stable since 2014. Mild generalized intracranial artery tortuosity. Skull and upper cervical spine: Chronic cervical disc degeneration. Normal bone marrow signal. Sinuses/Orbits: Stable and negative. Other: Mastoid air cells are clear. Visible internal auditory structures appear normal. MRA HEAD FINDINGS Antegrade flow in the posterior circulation appears stable to the 2014 MRA. There is bilateral distal vertebral artery and basilar artery irregularity in keeping with atherosclerosis. Moderate to severe left vertebral V4 segment stenosis. No significant basilar artery stenosis. SCA origins are patent. There are fetal type bilateral PCA origins, and there is faint flow signal in the right posterior communicating artery. However, there is chronically decreased flow signal in the right PCA branches compared to the left. The appearance is stable since 2014. Mild to moderate left P2 and moderate to severe left P3 segment stenoses also appears stable. No flow signal in the cervical right ICA, right  ICA siphon, terminus, right MCA, or right ACA. Flow signal in the left ICA siphon appears stable since 2014. The left ophthalmic and posterior communicating artery origins remain patent. The left ICA terminus is patent. The left MCA and ACA origins remain normal. The visible left MCA and ACA branches are stable. No anterior communicating artery flow identified, but the A-comm appear diminutive or absent on the 2014 MRA. IMPRESSION: 1. MRA indicates occlusion of the cervical right ICA, the right ICA terminus, right MCA, and right ACA. 2. Large right hemisphere infarct involving the entirety of the right MCA territory, some of the right MCA/PCA watershed territory (see #4), and also minimally involving the distal right ACA territory. 3. Confluent cytotoxic edema. Right basal ganglia petechial hemorrhage. Intracranial mass effect with leftward midline shift of 3-4 mm is stable from the head CTs earlier today. 4. Chronic posterior circulation atherosclerosis and asymmetrically decreased right PCA flow signal. Moderate to severe distal left vertebral artery stenosis may have progressed since 2014. 5. Punctate acute to subacute white matter infarct in the left corona radiata appears unrelated to #1. No other recent ischemia in the left hemisphere or posterior fossa. 6. No distal  left ICA or left anterior circulation stenosis. Electronically Signed   By: Odessa Fleming M.D.   On: 08/30/2017 12:47   Dg Chest Port 1 View  Result Date: Sep 10, 2017 CLINICAL DATA:  84 year old female code stroke. EXAM: PORTABLE CHEST 1 VIEW COMPARISON:  Chest radiographs 05/12/2016 and earlier. FINDINGS: Portable AP semi upright view at 1811 hours. Stable lung volumes. Stable cardiomegaly and mediastinal contours. Chronic left chest cardiac pacemaker. Allowing for portable technique the lungs are clear. No pneumothorax or pleural effusion. No acute osseous abnormality identified. IMPRESSION: No acute cardiopulmonary abnormality. Electronically  Signed   By: Odessa Fleming M.D.   On: 2017-09-10 18:27   Mr Maxine Glenn Head Wo Contrast  Result Date: 08/30/2017 CLINICAL DATA:  84 year old female with large right MCA infarct. MRI compatible [employer] pacemaker. EXAM: MRI HEAD WITHOUT CONTRAST MRA HEAD WITHOUT CONTRAST TECHNIQUE: Multiplanar, multiecho pulse sequences of the brain and surrounding structures were obtained without intravenous contrast. Angiographic images of the head were obtained using MRA technique without contrast. COMPARISON:  Head CT without contrast 753 hours today and earlier. Brain MRI and intracranial MRA 05/14/2013. FINDINGS: MRI HEAD FINDINGS Brain: Confluent restricted diffusion and cytotoxic edema throughout much of the right hemisphere conforming to the right MCA territory. There is scattered cortical and subcortical white matter restricted diffusion also along the medial right superior frontal gyrus corresponding to some of the ACA territory (axial diffusion image 82). Dense involvement of the right basal ganglia. Subtotal involvement of the right temporal lobe. Extension two some of the right MCA/PCA watershed territory. Involvement of the right cerebral peduncle. Petechial hemorrhage in the right basal ganglia tracking toward the right temporal stem. Also there does appear to be trace layering hemorrhage in the right occipital horn (SWI image 26). Subtotal effacement of the right lateral ventricle. No ventriculomegaly. Leftward midline shift of 3-4 millimeters. Patent basilar cisterns. In the contralateral left hemisphere there is only a punctate possible area of restricted diffusion in the left corona radiata on diffusion axial image 69. This appears isointense on the ADC. No posterior fossa restricted diffusion. Chronic infarcts in the cerebellar hemispheres, mildly progressed on the left since 2014. Several chronic lacunar infarcts in the left lentiform, some of which were acute on the prior MRI. Associated small areas of cortical  encephalomalacia in the left middle frontal gyrus. No other intracranial blood products identified. Cervicomedullary junction and pituitary are within normal limits. Vascular: Abnormal right ICA flow void in the upper neck and throughout the right siphon compatible with slow flow or occlusion (axial T2 image 6). Evidence of reconstituted flow at the right ICA terminus. See MRA findings below. Other Major intracranial vascular flow voids are stable since 2014. Mild generalized intracranial artery tortuosity. Skull and upper cervical spine: Chronic cervical disc degeneration. Normal bone marrow signal. Sinuses/Orbits: Stable and negative. Other: Mastoid air cells are clear. Visible internal auditory structures appear normal. MRA HEAD FINDINGS Antegrade flow in the posterior circulation appears stable to the 2014 MRA. There is bilateral distal vertebral artery and basilar artery irregularity in keeping with atherosclerosis. Moderate to severe left vertebral V4 segment stenosis. No significant basilar artery stenosis. SCA origins are patent. There are fetal type bilateral PCA origins, and there is faint flow signal in the right posterior communicating artery. However, there is chronically decreased flow signal in the right PCA branches compared to the left. The appearance is stable since 2014. Mild to moderate left P2 and moderate to severe left P3 segment stenoses also appears stable. No flow signal  in the cervical right ICA, right ICA siphon, terminus, right MCA, or right ACA. Flow signal in the left ICA siphon appears stable since 2014. The left ophthalmic and posterior communicating artery origins remain patent. The left ICA terminus is patent. The left MCA and ACA origins remain normal. The visible left MCA and ACA branches are stable. No anterior communicating artery flow identified, but the A-comm appear diminutive or absent on the 2014 MRA. IMPRESSION: 1. MRA indicates occlusion of the cervical right ICA, the  right ICA terminus, right MCA, and right ACA. 2. Large right hemisphere infarct involving the entirety of the right MCA territory, some of the right MCA/PCA watershed territory (see #4), and also minimally involving the distal right ACA territory. 3. Confluent cytotoxic edema. Right basal ganglia petechial hemorrhage. Intracranial mass effect with leftward midline shift of 3-4 mm is stable from the head CTs earlier today. 4. Chronic posterior circulation atherosclerosis and asymmetrically decreased right PCA flow signal. Moderate to severe distal left vertebral artery stenosis may have progressed since 2014. 5. Punctate acute to subacute white matter infarct in the left corona radiata appears unrelated to #1. No other recent ischemia in the left hemisphere or posterior fossa. 6. No distal left ICA or left anterior circulation stenosis. Electronically Signed   By: Odessa FlemingH  Hall M.D.   On: 08/30/2017 12:47   Ct Head Code Stroke Wo Contrast  Result Date: 09/18/2017 CLINICAL DATA:  Code stroke.  Left-sided deficits EXAM: CT HEAD WITHOUT CONTRAST TECHNIQUE: Contiguous axial images were obtained from the base of the skull through the vertex without intravenous contrast. COMPARISON:  Head CT 03/03/2014 FINDINGS: Brain: No mass lesion or acute hemorrhage. There is a large amount of hypoattenuation throughout the right MCA territory with mild mass effect on the right lateral ventricle. No acute hemorrhage. There is an old left basal ganglia lacunar infarct. No hydrocephalus. Old right cerebellar infarcts are unchanged. Vascular: No hyperdense vessel. No advanced atherosclerotic calcification of the arteries at the skull base. Skull: Normal visualized skull base, calvarium and extracranial soft tissues. Sinuses/Orbits: Fluid in the left sphenoid sinus.  Normal orbits. ASPECTS [facility]([place] Stroke Program Early CT Score) - Ganglionic level infarction (caudate, lentiform nuclei, internal capsule, insula, M1-M3 cortex): 0 -  Supraganglionic infarction (M4-M6 cortex): 0 Total score (0-10 with 10 being normal): 0 IMPRESSION: 1. Nonhemorrhagic acute to early subacute infarct of the right MCA territory. 2. ASPECTS is zero. These results were communicated to Dr. Caryl PinaEric Lindzen at 6:07 pm on 09/10/2017 by text page via the [facility] messaging system. Electronically Signed   By: Deatra RobinsonKevin  Herman M.D.   On: April 21, 2018 18:07   Carotid Doppler  pending  TTE pending   PHYSICAL EXAM  Temp:  [98.1 F (36.7 C)-100.8 F (38.2 C)] 98.3 F (36.8 C) (04/10 1500) Pulse Rate:  [56-101] 86 (04/10 1500) Resp:  [13-26] 18 (04/10 1500) BP: (113-153)/(48-101) 132/64 (04/10 1500) SpO2:  [94 %-100 %] 98 % (04/10 1500) Weight:  [157 lb 10.1 oz (71.5 kg)] 157 lb 10.1 oz (71.5 kg) (04/10 0430)  General - Well nourished, well developed, lethargic and drowsy.  Ophthalmologic - fundi not visualized due to pinpoint pupils.  Cardiovascular - irregularly irregular heart rate and rhythm.  Neuro - lethargic and drowsy, briefly open eyes on voice but eye closed shortly after. Able to tell me her name and age, orientated to place and people and time but very dysarthric. Follows simple central and peripheral commands. Eyes deviated to right, not crossing midline, not blinking to  visual threat bilaterally. Bilateral small pupils, sluggish to light. Left facial droop, tongue midline in mouth. Left UE flaccid, LLE withdraw to pain. RUE follows simple commands. 3/5 RUE and 2/5 RLE. DTR diminished and no babinski. Sensation, coordination and gait not tested.   ASSESSMENT/PLAN [name] is a 84 y.o. female with history of PAF not on AC, SSS s/p PPM, hx of stroke, HTN admitted for found down at home, left sided weakness, left facial droop and right gaze preference. No tPA given due to out side window.    Stroke:  right total MCA, punctate right ACA and left MCA infarcts embolic secondary to Afib not on AC  Resultant right gaze, left hemiplegia, left  facial droop  MRI  Right large MCA, puncate right ACA and left MCA infarcts  MRA  Right ICA, MCA, ACA occluded. Severe stenosis left VA  Carotid Doppler  pending  2D Echo  pending  LDL 59  HgbA1c 5.3  Heparin subq for VTE prophylaxis  Diet NPO time specified  Fall precautions   clopidogrel 75 mg daily prior to admission, now on aspirin 300 mg suppository daily.   Ongoing aggressive stroke risk factor management  Therapy recommendations:  pending  Disposition:  Pending  PAF not on AC  PAF detected on PPM  Follows with Dr. Rubie Maid  Fall risk and pt has declined AC  On plavix at home  Rate controlled currently  Close monitoring  Hx of stroke  04/2013 - numbness, AMS, slurry speech. MRI left MCA infarct, old right CR infarct and old right cerebellar infarcts. On plavix (no ASA due to primary biliary cirrhosis). TEE no PFO, unremarkable. MRA intracranial athero. CUS and TTE unremarkable. EKG no afib.  Hypertension Stable Permissive hypertension (OK if <220/120) for 24-48 hours post stroke and then gradually normalized within 5-7 days.  Long term BP goal normotensive  Hyperlipidemia  Home meds:  lipitor 40   LDL 59, goal < 70  Continue statin once po access  Other Stroke Risk Factors  Advanced age  Obesity, Body mass index is 31.84 kg/m.   SSS on PPM  Other Active Problems  Cognitive impairment recently  Hospital day # 1  This patient is critically ill due to right large MCA, right ICA occlusion, cerebral edema, afib RVR and at significant risk of neurological worsening, death form brain herniation, hemorrhagic infarct, heart failure, seizure. This patient's care requires constant monitoring of vital signs, hemodynamics, respiratory and cardiac monitoring, review of multiple databases, neurological assessment, discussion with family, other specialists and medical decision making of high complexity. I had long discussion with sister and daughter at  bedside, updated pt current condition, treatment plan and potential prognosis. They expressed understanding and appreciation. I spent 45 minutes of neurocritical care time in the care of this patient.   Marvel Plan, MD PhD Stroke Neurology 08/30/2017 3:43 PM    To contact Stroke Continuity provider, please refer to [email or web address]. After hours, contact General Neurology

## 2017-08-31 ENCOUNTER — Inpatient Hospital Stay (HOSPITAL_COMMUNITY): Payer: PPO

## 2017-08-31 DIAGNOSIS — I63511 Cerebral infarction due to unspecified occlusion or stenosis of right middle cerebral artery: Secondary | ICD-10-CM

## 2017-08-31 DIAGNOSIS — R1310 Dysphagia, unspecified: Secondary | ICD-10-CM

## 2017-08-31 DIAGNOSIS — G936 Cerebral edema: Secondary | ICD-10-CM

## 2017-08-31 DIAGNOSIS — I481 Persistent atrial fibrillation: Secondary | ICD-10-CM

## 2017-08-31 LAB — CBC
HCT: 41.3 % (ref 36.0–46.0)
HEMOGLOBIN: 13.5 g/dL (ref 12.0–15.0)
MCH: 30.5 pg (ref 26.0–34.0)
MCHC: 32.7 g/dL (ref 30.0–36.0)
MCV: 93.2 fL (ref 78.0–100.0)
PLATELETS: 181 10*3/uL (ref 150–400)
RBC: 4.43 MIL/uL (ref 3.87–5.11)
RDW: 14.1 % (ref 11.5–15.5)
WBC: 11.5 10*3/uL — ABNORMAL HIGH (ref 4.0–10.5)

## 2017-08-31 LAB — BASIC METABOLIC PANEL
Anion gap: 11 (ref 5–15)
BUN: 14 mg/dL (ref 6–20)
CHLORIDE: 128 mmol/L — AB (ref 101–111)
CO2: 22 mmol/L (ref 22–32)
CREATININE: 1.02 mg/dL — AB (ref 0.44–1.00)
Calcium: 9.2 mg/dL (ref 8.9–10.3)
GFR, EST AFRICAN AMERICAN: 58 mL/min — AB (ref >60)
GFR, EST NON AFRICAN AMERICAN: 50 mL/min — AB (ref >60)
Glucose, Bld: 139 mg/dL — ABNORMAL HIGH (ref 65–99)
POTASSIUM: 2.7 mmol/L — AB (ref 3.5–5.1)
SODIUM: 161 mmol/L — AB (ref 135–145)

## 2017-08-31 LAB — COMPREHENSIVE METABOLIC PANEL
ALT: 20 U/L (ref 14–54)
ANION GAP: 8 (ref 5–15)
AST: 38 U/L (ref 15–41)
Albumin: 2.8 g/dL — ABNORMAL LOW (ref 3.5–5.0)
Alkaline Phosphatase: 133 U/L — ABNORMAL HIGH (ref 38–126)
BUN: 19 mg/dL (ref 6–20)
CHLORIDE: 125 mmol/L — AB (ref 101–111)
CO2: 23 mmol/L (ref 22–32)
Calcium: 9.4 mg/dL (ref 8.9–10.3)
Creatinine, Ser: 1.02 mg/dL — ABNORMAL HIGH (ref 0.44–1.00)
GFR calc Af Amer: 58 mL/min — ABNORMAL LOW (ref >60)
GFR, EST NON AFRICAN AMERICAN: 50 mL/min — AB (ref >60)
Glucose, Bld: 128 mg/dL — ABNORMAL HIGH (ref 65–99)
POTASSIUM: 3.3 mmol/L — AB (ref 3.5–5.1)
Sodium: 156 mmol/L — ABNORMAL HIGH (ref 135–145)
Total Bilirubin: 1.2 mg/dL (ref 0.3–1.2)
Total Protein: 6.7 g/dL (ref 6.5–8.1)

## 2017-08-31 LAB — SODIUM
SODIUM: 156 mmol/L — AB (ref 135–145)
SODIUM: 158 mmol/L — AB (ref 135–145)
SODIUM: 161 mmol/L — AB (ref 135–145)

## 2017-08-31 MED ORDER — SODIUM CHLORIDE 0.45 % IV SOLN
INTRAVENOUS | Status: DC
  Administered 2017-08-31: 10:00:00 via INTRAVENOUS
  Administered 2017-09-01: 50 mL/h via INTRAVENOUS

## 2017-08-31 MED ORDER — METOPROLOL TARTRATE 5 MG/5ML IV SOLN
5.0000 mg | INTRAVENOUS | Status: DC | PRN
  Administered 2017-09-01 – 2017-09-02 (×2): 5 mg via INTRAVENOUS
  Filled 2017-08-31 (×3): qty 5

## 2017-08-31 MED ORDER — POTASSIUM CHLORIDE 10 MEQ/50ML IV SOLN
10.0000 meq | INTRAVENOUS | Status: AC
  Administered 2017-08-31 (×6): 10 meq via INTRAVENOUS
  Filled 2017-08-31 (×6): qty 50

## 2017-08-31 MED ORDER — METOPROLOL TARTRATE 5 MG/5ML IV SOLN
5.0000 mg | Freq: Four times a day (QID) | INTRAVENOUS | Status: DC
  Administered 2017-08-31 – 2017-09-03 (×11): 5 mg via INTRAVENOUS
  Filled 2017-08-31 (×11): qty 5

## 2017-08-31 MED ORDER — METOPROLOL TARTRATE 5 MG/5ML IV SOLN
5.0000 mg | INTRAVENOUS | Status: DC | PRN
  Administered 2017-08-31: 5 mg via INTRAVENOUS
  Filled 2017-08-31: qty 5

## 2017-08-31 MED ORDER — POTASSIUM CHLORIDE 10 MEQ/100ML IV SOLN
10.0000 meq | INTRAVENOUS | Status: AC
  Administered 2017-09-01 (×2): 10 meq via INTRAVENOUS
  Filled 2017-08-31 (×2): qty 100

## 2017-08-31 NOTE — Progress Notes (Signed)
0700-Report given to HurstbourneBrooke. No changes over the night.

## 2017-08-31 NOTE — Evaluation (Signed)
Physical Therapy Evaluation Patient Details Name: Diamond RuddleClara L Miller MRN: [id number] DOB: 10-Feb-1933 Today's Date: 08/31/2017   History of Present Illness  pt is an 84 y/o female with pmh significant for L CVA, COPD, DDD, HTN, pacer, R TKA, admitted to ED with Left sided weakness, left facial droop and R gaze deviation.  MRI shows Large right hemisphere infarct involving the entirety of the  Clinical Impression  Pt admitted with/for severe R MCA CVA.  Total assist for all mobility.  Pt currently limited functionally due to the problems listed. ( See problems list.)   Pt will benefit from PT to maximize function and safety in order to get ready for next venue listed below.     Follow Up Recommendations SNF    Equipment Recommendations  (TBA)    Recommendations for Other Services       Precautions / Restrictions Precautions Precautions: Fall      Mobility  Bed Mobility Overal bed mobility: Needs Assistance Bed Mobility: Supine to Sit;Sit to Supine     Supine to sit: Total assist;+2 for physical assistance Sit to supine: Total assist;+2 for physical assistance   General bed mobility comments: no discernable truncal assist during transition from supine to sit and not a coordinated assist sit to supine  Transfers                    Ambulation/Gait                Stairs            Wheelchair Mobility    Modified Rankin (Stroke Patients Only) Modified Rankin (Stroke Patients Only) Pre-Morbid Rankin Score: No symptoms Modified Rankin: Severe disability     Balance Overall balance assessment: Needs assistance Sitting-balance support: Feet supported;Feet unsupported;Single extremity supported Sitting balance-Leahy Scale: Zero Sitting balance - Comments: pt needed significant truncal assist/stability to stay relatively upright ( in a posterior pelvil tilted posture).  No purposefull truncal movements noted.                                      Pertinent Vitals/Pain Pain Assessment: No/denies pain    Home Living Family/patient expects to be discharged to:: Unsure                 Additional Comments: no family available to gather home information or PLOF    Prior Function                 Hand Dominance   Dominant Hand: Right    Extremity/Trunk Assessment   Upper Extremity Assessment Upper Extremity Assessment: Defer to OT evaluation    Lower Extremity Assessment Lower Extremity Assessment: RLE deficits/detail;LLE deficits/detail RLE Deficits / Details: Although moves R LE voluntarily there was little directed and coordinated movement from the right LE LLE Deficits / Details: not no spontaneous movement of the left LE and pt did not react to touch of the Right LE LLE Coordination: decreased gross motor;decreased fine motor    Cervical / Trunk Assessment Cervical / Trunk Assessment: Kyphotic(scoliotic)  Communication   Communication: Other (comment)(pt continually mumbling words)  Cognition Arousal/Alertness: Lethargic Behavior During Therapy: Flat affect Overall Cognitive Status: Impaired/Different from baseline Area of Impairment: Orientation;Attention;Memory;Following commands;Safety/judgement;Awareness;Problem solving                 Orientation Level: Time(needs some cuing) Current Attention Level: Focused Memory: Decreased short-term memory  Following Commands: Follows one step commands inconsistently Safety/Judgement: Decreased awareness of safety;Decreased awareness of deficits Awareness: Intellectual Problem Solving: Slow processing;Decreased initiation;Difficulty sequencing;Requires verbal cues;Requires tactile cues General Comments: extreme neglect toward the left side.      General Comments General comments (skin integrity, edema, etc.): Pt totally neglected her left side throughout the session.  In sitting pt with head turned fully R with full R gaze.  Therapy unable to  get eyes to track away from full R position. Pt also unable to move her neck voluntarily to focus in on something presented to her.  Pt was unable to name an object (toothbruch) placed in her hand and in her field of vision, nor was she able to tell or show how the object was used.    Exercises     Assessment/Plan    PT Assessment Patient needs continued PT services  PT Problem List Decreased strength;Decreased activity tolerance;Decreased balance;Decreased mobility;Decreased coordination;Decreased cognition;Decreased safety awareness;Impaired sensation;Impaired tone       PT Treatment Interventions Functional mobility training;Therapeutic activities;Therapeutic exercise;Neuromuscular re-education;Patient/family education    PT Goals (Current goals can be found in the Care Plan section)  Acute Rehab PT Goals Patient Stated Goal: pt unable PT Goal Formulation: Patient unable to participate in goal setting Time For Goal Achievement: 09/14/17 Potential to Achieve Goals: Fair    Frequency Min 2X/week   Barriers to discharge        Co-evaluation               AM-PAC PT "6 Clicks" Daily Activity  Outcome Measure Difficulty turning over in bed (including adjusting bedclothes, sheets and blankets)?: Unable Difficulty moving from lying on back to sitting on the side of the bed? : Unable Difficulty sitting down on and standing up from a chair with arms (e.g., wheelchair, bedside commode, etc,.)?: Unable Help needed moving to and from a bed to chair (including a wheelchair)?: Total Help needed walking in hospital room?: Total Help needed climbing 3-5 steps with a railing? : Total 6 Click Score: 6    End of Session   Activity Tolerance: Patient limited by fatigue Patient left: in bed;with call bell/phone within reach Nurse Communication: Mobility status;Need for lift equipment PT Visit Diagnosis: Hemiplegia and hemiparesis Hemiplegia - Right/Left: Left Hemiplegia -  dominant/non-dominant: Non-dominant Hemiplegia - caused by: Cerebral infarction    Time: 1440-1501 PT Time Calculation (min) (ACUTE ONLY): 21 min   Charges:   PT Evaluation $PT Eval High Complexity: 1 High     PT G Codes:        3  Eliseo Gum Lanier Felty 08/31/2017, 6:47 PM 08/31/2017  Bethel Bing, PT [phone]  (pager)

## 2017-08-31 NOTE — Progress Notes (Signed)
0400-MD made aware of sodium level 156, above 150-155 goal. Verbal order to change 3% rate to 50mL

## 2017-08-31 NOTE — Evaluation (Signed)
Clinical/Bedside Swallow Evaluation Patient Details  Name: Diamond Miller MRN: [id number] Date of Birth: [birth year]  Today's Date: 08/31/2017 Time: SLP Start Time (ACUTE ONLY): 0915 SLP Stop Time (ACUTE ONLY): 0945 SLP Time Calculation (min) (ACUTE ONLY): 30 min  Past Medical History:  Past Medical History:  Diagnosis Date  . Acid reflux   . Anemia 1957  . Anxiety   . Chronic back pain   . COPD (chronic obstructive pulmonary disease) (HCC)   . Degenerative disc disease, cervical   . Dysrhythmia    "skips" (06/12/2013)  . Exertional shortness of breath    "recently" (06/12/2013)  . High cholesterol   . History of blood transfusion    "think it was when I had knee OR" (06/12/2013)  . Hypertension   . Liver cirrhosis (HCC)   . Pacemaker, Medtronic placed 06/01/13 06/11/2013  . Scoliosis   . Stroke (HCC) 05/14/2013   denies residual on 06/12/2013   Past Surgical History:  Past Surgical History:  Procedure Laterality Date  . CATARACT EXTRACTION W/ INTRAOCULAR LENS  IMPLANT, BILATERAL Bilateral 2006-2007  . INSERT / REPLACE / REMOVE PACEMAKER  06/11/2013  . JOINT REPLACEMENT    . PACEMAKER INSERTION  06/01/13   Dr. Royann Shivers Medtronic device  . PERMANENT PACEMAKER INSERTION N/A 06/11/2013   Procedure: PERMANENT PACEMAKER INSERTION;  Surgeon: Thurmon Fair, MD;  Location: [facility] CATH LAB;  Service: Cardiovascular;  Laterality: N/A;  . REPLACEMENT TOTAL KNEE Right 2010  . TEE WITHOUT CARDIOVERSION N/A 05/17/2013   Procedure: TRANSESOPHAGEAL ECHOCARDIOGRAM (TEE);  Surgeon: Vesta Mixer, MD;  Location: [facility] ENDOSCOPY;  Service: Cardiovascular;  Laterality: N/A;  . TONSILLECTOMY  ~ 1961   HPI:  Diamond Miller is a 84 y.o. female with history of PAF not on AC, SSS s/p PPM, hx of stroke, HTN admitted for found down at home, left sided weakness, left facial droop and right gaze preference. No tPA given due to out side window.  Found to have right total MCA, punctate right ACA and  left MCA infarcts embolic secondary to Afib not on AC.    Assessment / Plan / Recommendation Clinical Impression  Pt demonstrates severe risk of aspiration in setting of neuromuscular and cognitive deficits following large Right CVA. Pt exhibits left sided oral weakness, but head is persitently tilted right leading to right sided oral pooling and spillage. Pt is attentive initially to PO, but is verbose and often begins mumbling with PO in mouth prior to swallow. Cues to swallow are effective, but still followed by wet vocal quality and delayed involuntary coughing. Suspect impairment in timing and strength for swallowing that will impact safety with PO. Pt may be able to tolerate modified textures with careful feeding methods. Will plan for [facility] tomorrow for objective assessment of swallow function.  SLP Visit Diagnosis: Dysphagia, oropharyngeal phase (R13.12)    Aspiration Risk  Severe aspiration risk;Risk for inadequate nutrition/hydration    Diet Recommendation NPO        Other  Recommendations Oral Care Recommendations: Oral care QID   Follow up Recommendations Skilled Nursing facility      Frequency and Duration min 2x/week  2 weeks       Prognosis Prognosis for Safe Diet Advancement: Fair Barriers to Reach Goals: Severity of deficits      Swallow Study   General HPI: Diamond Miller is a 84 y.o. female with history of PAF not on AC, SSS s/p PPM, hx of stroke, HTN admitted for found down  at home, left sided weakness, left facial droop and right gaze preference. No tPA given due to out side window.  Found to have right total MCA, punctate right ACA and left MCA infarcts embolic secondary to Afib not on AC.  Type of Study: Bedside Swallow Evaluation Previous Swallow Assessment: none Diet Prior to this Study: NPO Temperature Spikes Noted: No Respiratory Status: Room air History of Recent Intubation: No Behavior/Cognition: Alert Oral Cavity Assessment: Dried  secretions Oral Care Completed by SLP: Yes Oral Cavity - Dentition: Edentulous Vision: Impaired for self-feeding Self-Feeding Abilities: Needs assist Patient Positioning: Upright in bed Baseline Vocal Quality: Low vocal intensity Volitional Cough: Weak Volitional Swallow: Able to elicit    Oral/Motor/Sensory Function Overall Oral Motor/Sensory Function: Moderate impairment Facial ROM: Reduced left;Suspected CN VII (facial) dysfunction Facial Symmetry: Abnormal symmetry left;Suspected CN VII (facial) dysfunction Facial Strength: Reduced left;Suspected CN VII (facial) dysfunction Facial Sensation: Reduced left;Suspected CN V (Trigeminal) dysfunction Lingual Strength: Reduced   Ice Chips Ice chips: Impaired Presentation: Spoon Oral Phase Impairments: Reduced labial seal;Reduced lingual movement/coordination;Poor awareness of bolus Oral Phase Functional Implications: Right anterior spillage;Right lateral sulci pocketing;Oral residue;Prolonged oral transit Pharyngeal Phase Impairments: Cough - Delayed;Wet Vocal Quality   Thin Liquid Thin Liquid: Impaired Presentation: Cup Oral Phase Impairments: Reduced labial seal Oral Phase Functional Implications: Right anterior spillage;Right lateral sulci pocketing Pharyngeal  Phase Impairments: Cough - Immediate    Nectar Thick Nectar Thick Liquid: Not tested   Honey Thick Honey Thick Liquid: Not tested   Puree Puree: Not tested   Solid   GO   Solid: Not tested       Harlon DittyBonnie Obie Silos, MA CCC-SLP [phone]  Claudine MoutonDeBlois, Kellan Raffield Caroline 08/31/2017,10:07 AM

## 2017-08-31 NOTE — Progress Notes (Signed)
Occupational Therapy Progress Note  OT eval completed, full write up to follow.   Jeani HawkingWendi Braden Cimo, OTR/L [phone]

## 2017-08-31 NOTE — Progress Notes (Signed)
Text paged Dr. Roda ShuttersXu re: sodium 161.

## 2017-08-31 NOTE — Progress Notes (Addendum)
STROKE TEAM PROGRESS NOTE   SUBJECTIVE (INTERVAL HISTORY) Diamond Miller speech therapist is are at the bedside. Pt is awake alert and eyes open answer questions, sitting in bed. Did not pass swallow, still has right gaze and left hemiplegia. Na 161, will change to 1/2NS. Low K, will supplement. Will consider cortrak if not passing swallow tomorrow.  OBJECTIVE Temp:  [98.1 F (36.7 C)-99.5 F (37.5 C)] 98.2 F (36.8 C) (04/11 0824) Pulse Rate:  [62-112] 112 (04/11 1100) Cardiac Rhythm: Atrial fibrillation (04/11 0400) Resp:  [16-30] 18 (04/11 1100) BP: (118-160)/(56-95) 160/95 (04/11 1100) SpO2:  [96 %-100 %] 98 % (04/11 1100)  Recent Labs  Lab 09/28/17 1747  GLUCAP 103*   Recent Labs  Lab 2017-09-28 1751 2017/09/28 1759  08/30/17 0411 08/30/17 0451 08/30/17 1225 08/30/17 1635 08/31/17 0045 08/31/17 0723  NA 138 135   < > 144 144 149* 149* 156* 161*  K 4.2 4.1  --   --   --   --   --   --  2.7*  CL 98* 98*  --   --   --   --   --   --  128*  CO2  --  26  --   --   --   --   --   --  22  GLUCOSE 112* 109*  --   --   --   --   --   --  139*  BUN 12 11  --   --   --   --   --   --  14  CREATININE 1.00 1.08*  --  1.03*  --   --   --   --  1.02*  CALCIUM  --  9.5  --   --   --   --   --   --  9.2   < > = values in this interval not displayed.   Recent Labs  Lab Sep 28, 2017 1759  AST 53*  ALT 25  ALKPHOS 198*  BILITOT 0.9  PROT 7.4  ALBUMIN 3.3*   Recent Labs  Lab 09-28-2017 1751 09-28-2017 1759 08/30/17 0411 08/31/17 0723  WBC  --  10.3 10.8* 11.5*  NEUTROABS  --  8.0*  --   --   HGB 15.6* 15.3* 13.4 13.5  HCT 46.0 45.6 40.8 41.3  MCV  --  88.9 91.5 93.2  PLT  --  174 192 181   No results for input(s): CKTOTAL, CKMB, CKMBINDEX, TROPONINI in the last 168 hours. Recent Labs    Sep 28, 2017 1759  LABPROT 13.8  INR 1.06   No results for input(s): COLORURINE, LABSPEC, PHURINE, GLUCOSEU, HGBUR, BILIRUBINUR, KETONESUR, PROTEINUR, UROBILINOGEN, NITRITE, LEUKOCYTESUR in the last 72  hours.  Invalid input(s): APPERANCEUR     Component Value Date/Time   CHOL 108 08/30/2017 0411   TRIG 69 08/30/2017 0411   HDL 35 (L) 08/30/2017 0411   CHOLHDL 3.1 08/30/2017 0411   VLDL 14 08/30/2017 0411   LDLCALC 59 08/30/2017 0411   Lab Results  Component Value Date   HGBA1C 5.3 08/30/2017   No results found for: LABOPIA, COCAINSCRNUR, LABBENZ, AMPHETMU, THCU, LABBARB  No results for input(s): ETH in the last 168 hours.  I have personally reviewed the radiological images below and agree with the radiology interpretations.  Ct Head Wo Contrast  Result Date: 08/30/2017 CLINICAL DATA:  84 year old female with large right MCA infarct. Lethargy, unresponsive. EXAM: CT HEAD WITHOUT CONTRAST TECHNIQUE: Contiguous axial images were obtained from  the base of the skull through the vertex without intravenous contrast. COMPARISON:  Head CT 0354 hours today, 2017/09/13. FINDINGS: Brain: A large area well developed cytotoxic edema occupying the right MCA territory, including the right basal ganglia, and prominently involving right MCA/PCA watershed territory is stable since 0354 hours today. There is trace petechial hemorrhage at the level of the right caudate (series 3, image 17). No hemorrhage related mass effect. Cytotoxic edema related mass effect appears stable with subtotal effacement of the right lateral ventricle but only 3 millimeters of leftward midline shift (also image 17). Basilar cisterns remain patent and are unchanged. No ventriculomegaly. Stable gray-white matter differentiation elsewhere, including patchy white matter hypodensity in the left hemisphere, occasional small left MCA territory cortical encephalomalacia, and chronic small infarcts in the cerebellum. Vascular: Calcified atherosclerosis at the skull base. Asymmetric right MCA hyperdensity re- demonstrated. Skull: No acute osseous abnormality identified. Sinuses/Orbits: Stable bubbly opacity in the left sphenoid sinus. Other:  No acute orbit or scalp soft tissue findings. Small volume retained secretions in the nasopharynx. IMPRESSION: 1. Stable non contrast CT appearance of the brain since 0354 hours today. Large, confluent right MCA territory infarct with cytotoxic edema and trace petechial hemorrhage. 2. Effaced right lateral ventricle, but only 3 mm of leftward midline shift at this time, no ventriculomegaly, and basilar cisterns remain patent. 3. No new intracranial abnormality. Electronically Signed   By: Odessa Fleming M.D.   On: 08/30/2017 08:35   Ct Head Wo Contrast  Result Date: 08/30/2017 CLINICAL DATA:  Follow-up examination for acute stroke. EXAM: CT HEAD WITHOUT CONTRAST TECHNIQUE: Contiguous axial images were obtained from the base of the skull through the vertex without intravenous contrast. COMPARISON:  Prior CT from 08/29/2016. FINDINGS: Brain: Continued interval evolution of large volume subacute right MCA territory infarct, with extensive cytotoxic edema seen throughout the majority of the right MCA territory. Overall size and distribution relatively similar to previous. Increasing mass effect with slightly worsened 4 mm of right-to-left shift. Right lateral ventricle partially attenuated. Basilar cisterns remain patent. Possible scattered areas of faint internal petechial hemorrhage without hemorrhagic transformation. No acute intracranial hemorrhage. No new acute large vessel territory infarct. No mass lesion. No extra-axial fluid collection. Underlying atrophy with chronic small vessel ischemic disease again noted. Remote bilateral cerebellar infarcts noted, right greater than left. Vascular: No new hyperdense vessel. Calcified atherosclerosis noted at the skull base. Skull: Scalp soft tissues and calvarium demonstrate no acute abnormality. Sinuses/Orbits: Globes and orbital soft tissues within normal limits. Mild layering opacity noted within left sphenoid sinus. Paranasal sinuses are otherwise clear. No mastoid  effusion. Other: None. IMPRESSION: 1. Continued interval evolution of large subacute right MCA territory infarct, stable in size and distribution as compared to previous. Increasing regional mass effect with worsened 4 mm of right-to-left midline shift. No evidence for hemorrhagic transformation. 2. Otherwise stable appearance of the brain. No new acute intracranial abnormality identified. Electronically Signed   By: Rise Mu M.D.   On: 08/30/2017 04:34   Mr Brain Wo Contrast  Result Date: 08/30/2017 CLINICAL DATA:  84 year old female with large right MCA infarct. MRI compatible [employer] pacemaker. EXAM: MRI HEAD WITHOUT CONTRAST MRA HEAD WITHOUT CONTRAST TECHNIQUE: Multiplanar, multiecho pulse sequences of the brain and surrounding structures were obtained without intravenous contrast. Angiographic images of the head were obtained using MRA technique without contrast. COMPARISON:  Head CT without contrast 753 hours today and earlier. Brain MRI and intracranial MRA 05/14/2013. FINDINGS: MRI HEAD FINDINGS Brain: Confluent restricted diffusion and  cytotoxic edema throughout much of the right hemisphere conforming to the right MCA territory. There is scattered cortical and subcortical white matter restricted diffusion also along the medial right superior frontal gyrus corresponding to some of the ACA territory (axial diffusion image 82). Dense involvement of the right basal ganglia. Subtotal involvement of the right temporal lobe. Extension two some of the right MCA/PCA watershed territory. Involvement of the right cerebral peduncle. Petechial hemorrhage in the right basal ganglia tracking toward the right temporal stem. Also there does appear to be trace layering hemorrhage in the right occipital horn (SWI image 26). Subtotal effacement of the right lateral ventricle. No ventriculomegaly. Leftward midline shift of 3-4 millimeters. Patent basilar cisterns. In the contralateral left hemisphere there is  only a punctate possible area of restricted diffusion in the left corona radiata on diffusion axial image 69. This appears isointense on the ADC. No posterior fossa restricted diffusion. Chronic infarcts in the cerebellar hemispheres, mildly progressed on the left since 2014. Several chronic lacunar infarcts in the left lentiform, some of which were acute on the prior MRI. Associated small areas of cortical encephalomalacia in the left middle frontal gyrus. No other intracranial blood products identified. Cervicomedullary junction and pituitary are within normal limits. Vascular: Abnormal right ICA flow void in the upper neck and throughout the right siphon compatible with slow flow or occlusion (axial T2 image 6). Evidence of reconstituted flow at the right ICA terminus. See MRA findings below. Other Major intracranial vascular flow voids are stable since 2014. Mild generalized intracranial artery tortuosity. Skull and upper cervical spine: Chronic cervical disc degeneration. Normal bone marrow signal. Sinuses/Orbits: Stable and negative. Other: Mastoid air cells are clear. Visible internal auditory structures appear normal. MRA HEAD FINDINGS Antegrade flow in the posterior circulation appears stable to the 2014 MRA. There is bilateral distal vertebral artery and basilar artery irregularity in keeping with atherosclerosis. Moderate to severe left vertebral V4 segment stenosis. No significant basilar artery stenosis. SCA origins are patent. There are fetal type bilateral PCA origins, and there is faint flow signal in the right posterior communicating artery. However, there is chronically decreased flow signal in the right PCA branches compared to the left. The appearance is stable since 2014. Mild to moderate left P2 and moderate to severe left P3 segment stenoses also appears stable. No flow signal in the cervical right ICA, right ICA siphon, terminus, right MCA, or right ACA. Flow signal in the left ICA siphon  appears stable since 2014. The left ophthalmic and posterior communicating artery origins remain patent. The left ICA terminus is patent. The left MCA and ACA origins remain normal. The visible left MCA and ACA branches are stable. No anterior communicating artery flow identified, but the A-comm appear diminutive or absent on the 2014 MRA. IMPRESSION: 1. MRA indicates occlusion of the cervical right ICA, the right ICA terminus, right MCA, and right ACA. 2. Large right hemisphere infarct involving the entirety of the right MCA territory, some of the right MCA/PCA watershed territory (see #4), and also minimally involving the distal right ACA territory. 3. Confluent cytotoxic edema. Right basal ganglia petechial hemorrhage. Intracranial mass effect with leftward midline shift of 3-4 mm is stable from the head CTs earlier today. 4. Chronic posterior circulation atherosclerosis and asymmetrically decreased right PCA flow signal. Moderate to severe distal left vertebral artery stenosis may have progressed since 2014. 5. Punctate acute to subacute white matter infarct in the left corona radiata appears unrelated to #1. No other recent ischemia  in the left hemisphere or posterior fossa. 6. No distal left ICA or left anterior circulation stenosis. Electronically Signed   By: Odessa Fleming M.D.   On: 08/30/2017 12:47   Dg Chest Port 1 View  Result Date: 08/22/2017 CLINICAL DATA:  84 year old female code stroke. EXAM: PORTABLE CHEST 1 VIEW COMPARISON:  Chest radiographs 05/12/2016 and earlier. FINDINGS: Portable AP semi upright view at 1811 hours. Stable lung volumes. Stable cardiomegaly and mediastinal contours. Chronic left chest cardiac pacemaker. Allowing for portable technique the lungs are clear. No pneumothorax or pleural effusion. No acute osseous abnormality identified. IMPRESSION: No acute cardiopulmonary abnormality. Electronically Signed   By: Odessa Fleming M.D.   On: Sep 03, 2017 18:27   Mr Maxine Glenn Head Wo Contrast  Result  Date: 08/30/2017 CLINICAL DATA:  84 year old female with large right MCA infarct. MRI compatible [employer] pacemaker. EXAM: MRI HEAD WITHOUT CONTRAST MRA HEAD WITHOUT CONTRAST TECHNIQUE: Multiplanar, multiecho pulse sequences of the brain and surrounding structures were obtained without intravenous contrast. Angiographic images of the head were obtained using MRA technique without contrast. COMPARISON:  Head CT without contrast 753 hours today and earlier. Brain MRI and intracranial MRA 05/14/2013. FINDINGS: MRI HEAD FINDINGS Brain: Confluent restricted diffusion and cytotoxic edema throughout much of the right hemisphere conforming to the right MCA territory. There is scattered cortical and subcortical white matter restricted diffusion also along the medial right superior frontal gyrus corresponding to some of the ACA territory (axial diffusion image 82). Dense involvement of the right basal ganglia. Subtotal involvement of the right temporal lobe. Extension two some of the right MCA/PCA watershed territory. Involvement of the right cerebral peduncle. Petechial hemorrhage in the right basal ganglia tracking toward the right temporal stem. Also there does appear to be trace layering hemorrhage in the right occipital horn (SWI image 26). Subtotal effacement of the right lateral ventricle. No ventriculomegaly. Leftward midline shift of 3-4 millimeters. Patent basilar cisterns. In the contralateral left hemisphere there is only a punctate possible area of restricted diffusion in the left corona radiata on diffusion axial image 69. This appears isointense on the ADC. No posterior fossa restricted diffusion. Chronic infarcts in the cerebellar hemispheres, mildly progressed on the left since 2014. Several chronic lacunar infarcts in the left lentiform, some of which were acute on the prior MRI. Associated small areas of cortical encephalomalacia in the left middle frontal gyrus. No other intracranial blood products  identified. Cervicomedullary junction and pituitary are within normal limits. Vascular: Abnormal right ICA flow void in the upper neck and throughout the right siphon compatible with slow flow or occlusion (axial T2 image 6). Evidence of reconstituted flow at the right ICA terminus. See MRA findings below. Other Major intracranial vascular flow voids are stable since 2014. Mild generalized intracranial artery tortuosity. Skull and upper cervical spine: Chronic cervical disc degeneration. Normal bone marrow signal. Sinuses/Orbits: Stable and negative. Other: Mastoid air cells are clear. Visible internal auditory structures appear normal. MRA HEAD FINDINGS Antegrade flow in the posterior circulation appears stable to the 2014 MRA. There is bilateral distal vertebral artery and basilar artery irregularity in keeping with atherosclerosis. Moderate to severe left vertebral V4 segment stenosis. No significant basilar artery stenosis. SCA origins are patent. There are fetal type bilateral PCA origins, and there is faint flow signal in the right posterior communicating artery. However, there is chronically decreased flow signal in the right PCA branches compared to the left. The appearance is stable since 2014. Mild to moderate left P2 and moderate to severe  left P3 segment stenoses also appears stable. No flow signal in the cervical right ICA, right ICA siphon, terminus, right MCA, or right ACA. Flow signal in the left ICA siphon appears stable since 2014. The left ophthalmic and posterior communicating artery origins remain patent. The left ICA terminus is patent. The left MCA and ACA origins remain normal. The visible left MCA and ACA branches are stable. No anterior communicating artery flow identified, but the A-comm appear diminutive or absent on the 2014 MRA. IMPRESSION: 1. MRA indicates occlusion of the cervical right ICA, the right ICA terminus, right MCA, and right ACA. 2. Large right hemisphere infarct involving  the entirety of the right MCA territory, some of the right MCA/PCA watershed territory (see #4), and also minimally involving the distal right ACA territory. 3. Confluent cytotoxic edema. Right basal ganglia petechial hemorrhage. Intracranial mass effect with leftward midline shift of 3-4 mm is stable from the head CTs earlier today. 4. Chronic posterior circulation atherosclerosis and asymmetrically decreased right PCA flow signal. Moderate to severe distal left vertebral artery stenosis may have progressed since 2014. 5. Punctate acute to subacute white matter infarct in the left corona radiata appears unrelated to #1. No other recent ischemia in the left hemisphere or posterior fossa. 6. No distal left ICA or left anterior circulation stenosis. Electronically Signed   By: Odessa Fleming M.D.   On: 08/30/2017 12:47   Ct Head Code Stroke Wo Contrast  Result Date: 09/01/2017 CLINICAL DATA:  Code stroke.  Left-sided deficits EXAM: CT HEAD WITHOUT CONTRAST TECHNIQUE: Contiguous axial images were obtained from the base of the skull through the vertex without intravenous contrast. COMPARISON:  Head CT 03/03/2014 FINDINGS: Brain: No mass lesion or acute hemorrhage. There is a large amount of hypoattenuation throughout the right MCA territory with mild mass effect on the right lateral ventricle. No acute hemorrhage. There is an old left basal ganglia lacunar infarct. No hydrocephalus. Old right cerebellar infarcts are unchanged. Vascular: No hyperdense vessel. No advanced atherosclerotic calcification of the arteries at the skull base. Skull: Normal visualized skull base, calvarium and extracranial soft tissues. Sinuses/Orbits: Fluid in the left sphenoid sinus.  Normal orbits. ASPECTS [facility] Stroke Program Early CT Score) - Ganglionic level infarction (caudate, lentiform nuclei, internal capsule, insula, M1-M3 cortex): 0 - Supraganglionic infarction (M4-M6 cortex): 0 Total score (0-10 with 10 being normal): 0 IMPRESSION: 1.  Nonhemorrhagic acute to early subacute infarct of the right MCA territory. 2. ASPECTS is zero. These results were communicated to Dr. Caryl Pina at 6:07 pm on 09/14/2017 by text page via the [facility] messaging system. Electronically Signed   By: Deatra Robinson M.D.   On: 09/09/2017 18:07   Carotid Doppler  pending  TTE pending - Left ventricle: The cavity size was normal. Systolic function was   vigorous. The estimated ejection fraction was in the range of 65%   to 70%. Wall motion was normal; there were no regional wall   motion abnormalities. The study was not technically sufficient to   allow evaluation of LV diastolic dysfunction due to atrial   fibrillation. - Aortic valve: There was mild regurgitation. - Aortic root: The aortic root was normal in size. - Mitral valve: There was mild regurgitation. - Left atrium: The atrium was normal in size. - Right ventricle: Pacer wire or catheter noted in right ventricle.   Systolic function was normal. - Right atrium: Pacer wire or catheter noted in right atrium. - Tricuspid valve: There was moderate regurgitation. - Pulmonary  arteries: Systolic pressure was mildly increased. PA   peak pressure: 32 mm Hg (S). - Inferior vena cava: The vessel was dilated. The respirophasic   diameter changes were blunted (< 50%), consistent with elevated   central venous pressure. - Pericardium, extracardiac: There was no pericardial effusion.  PHYSICAL EXAM  Temp:  [98.1 F (36.7 C)-99.5 F (37.5 C)] 98.2 F (36.8 C) (04/11 0824) Pulse Rate:  [62-112] 112 (04/11 1100) Resp:  [16-30] 18 (04/11 1100) BP: (118-160)/(56-95) 160/95 (04/11 1100) SpO2:  [96 %-100 %] 98 % (04/11 1100)  General - Well nourished, well developed, lethargic.  Ophthalmologic - fundi not visualized due to pinpoint pupils.  Cardiovascular - irregularly irregular heart rate and rhythm.  Neuro - lethargic, but eyes open, able to tell me Diamond Miller name and age, orientated to place, but not  orientated to people and time, severe dysarthria. Follows simple central and peripheral commands. Eyes deviated to right, not crossing midline, not blinking to visual threat bilaterally. Bilateral small pupils, sluggish to light. Left facial droop, tongue midline in mouth. Left UE and LE flaccid. RUE follows simple commands, 3/5 strength and RLE withdraw to pain. DTR diminished and no babinski. Sensation, coordination and gait not tested.   ASSESSMENT/PLAN Diamond Miller is a 84 y.o. female with history of PAF not on AC, SSS s/p PPM, hx of stroke, HTN admitted for found down at home, left sided weakness, left facial droop and right gaze preference. No tPA given due to out side window.    Stroke:  right total MCA, punctate right ACA and left MCA infarcts embolic secondary to Afib not on AC  Resultant right gaze, left hemiplegia, left facial droop  MRI  Right large MCA, puncate right ACA and left MCA infarcts  MRA  Right ICA, MCA, ACA occluded. Severe stenosis left VA  Carotid Doppler  pending  2D Echo EF 65-70%  LDL 59  HgbA1c 5.3  Heparin subq for VTE prophylaxis Diet NPO time specified  Fall precautions   clopidogrel 75 mg daily prior to admission, now on aspirin 300 mg suppository daily.   Ongoing aggressive stroke risk factor management  Therapy recommendations:  pending  Disposition:  Pending  Cerebral edema  Stable midline shift on MRI  Will repeat CT in am  On 3% saline, now change to 1/2 NS  Na 144->149->156->161  Na goal 150-155  Na Q6h  PAF not on AC  PAF detected on PPM  Follows with Dr. Rubie Maid  Fall risk and pt has declined AC  On plavix at home  Rate controlled on tele  Close monitoring  Hx of stroke  04/2013 - numbness, AMS, slurry speech. MRI left MCA infarct, old right CR infarct and old right cerebellar infarcts. On plavix (no ASA due to primary biliary cirrhosis). TEE no PFO, unremarkable. MRA intracranial athero. CUS and TTE  unremarkable. EKG no afib.  Hypertension Stable  Long term BP goal normotensive  Hyperlipidemia  Home meds:  lipitor 40   LDL 59, goal < 70  Continue statin once po access  Dysphagia   Did not pass swallow  NPO for now  Consider cortrak if not passing swallow in am  Other Stroke Risk Factors  Advanced age  Obesity, Body mass index is 31.84 kg/m.   SSS on PPM  Other Active Problems  Cognitive impairment recently  Hospital day # 2  This patient is critically ill due to right large MCA, right ICA occlusion, cerebral edema, afib RVR and at  significant risk of neurological worsening, death form brain herniation, hemorrhagic infarct, heart failure, seizure. This patient's care requires constant monitoring of vital signs, hemodynamics, respiratory and cardiac monitoring, review of multiple databases, neurological assessment, discussion with family, other specialists and medical decision making of high complexity. I had long discussion with sister and daughter at bedside, updated pt current condition, treatment plan and potential prognosis. They expressed understanding and appreciation. I spent 35 minutes of neurocritical care time in the care of this patient.   Marvel PlanJindong Zair Borawski, MD PhD Stroke Neurology 08/31/2017 11:16 AM    To contact Stroke Continuity provider, please refer to [email or web address]. After hours, contact General Neurology

## 2017-08-31 NOTE — Progress Notes (Signed)
Carotid duplex prelim:  Left 1-39% ICA stenosis.  Right side technically difficult due to patient holding head over to right side. High resistant /spiked waveforms noted in the Right CCA and ICA prox/ mid, suggestive of a more distal obstruction or occlusion. Farrel DemarkJill Eunice, RDMS, RVT

## 2017-08-31 NOTE — Evaluation (Signed)
Speech Language Pathology Evaluation Patient Details Name: Diamond Miller MRN: [id number] DOB: 1932/08/01 Today's Date: 08/31/2017 Time: 0865-7846 SLP Time Calculation (min) (ACUTE ONLY): 30 min  Problem List:  Patient Active Problem List   Diagnosis Date Noted  . Acute right arterial ischemic stroke, middle cerebral artery (MCA) (HCC) 08/21/2017  . Left hemiparesis (HCC)   . Adaptation reaction 04/08/2015  . Allergic rhinitis 04/08/2015  . Blocked tear duct 04/08/2015  . CN (constipation) 04/08/2015  . Cerebral infarct (HCC) 04/08/2015  . Ear ache 04/08/2015  . Edema extremities 04/08/2015  . Fall 04/08/2015  . Fatigue 04/08/2015  . Coccygeal fracture (HCC) 04/08/2015  . Acid reflux 04/08/2015  . HLD (hyperlipidemia) 04/08/2015  . Hypomagnesemia 04/08/2015  . Below normal amount of sodium in the blood 04/08/2015  . Cannot sleep 04/08/2015  . Arthritis, degenerative 04/08/2015  . Osteopenia 04/08/2015  . Pleurisy 04/08/2015  . Cirrhosis, primary biliary 04/08/2015  . Bilateral leg edema 12/24/2013  . Pacemaker, Medtronic placed 06/11/13 06/11/2013  . Tachy-brady syndrome (HCC) 05/27/2013  . Paroxysmal atrial fibrillation, has not been felt to be anticoagulation candidate secondary to freq falls and biliary cirhosis   05/27/2013  . Sinus pause 05/27/2013  . Elevated troponin- negative Myoview, Nl LVF 05/24/2013  . Demand ischemia (HCC) 05/19/2013  . Acute ischemic right MCA stroke (HCC) 05/14/2013  . Altered mental status 05/14/2013  . LEG EDEMA, BILATERAL 09/07/2010  . NONSPEC ELEVATION OF LEVELS OF TRANSAMINASE/LDH 03/19/2010  . CHEST WALL PAIN, ANTERIOR 02/26/2009  . UNSPECIFIED IRON DEFICIENCY ANEMIA 08/25/2008  . Hypertension 08/25/2008  . Esophageal reflux 08/25/2008  . NAUSEA ALONE 08/25/2008   Past Medical History:  Past Medical History:  Diagnosis Date  . Acid reflux   . Anemia 1957  . Anxiety   . Chronic back pain   . COPD (chronic obstructive  pulmonary disease) (HCC)   . Degenerative disc disease, cervical   . Dysrhythmia    "skips" (06/12/2013)  . Exertional shortness of breath    "recently" (06/12/2013)  . High cholesterol   . History of blood transfusion    "think it was when I had knee OR" (06/12/2013)  . Hypertension   . Liver cirrhosis (HCC)   . Pacemaker, Medtronic placed 06/01/13 06/11/2013  . Scoliosis   . Stroke (HCC) 05/14/2013   denies residual on 06/12/2013   Past Surgical History:  Past Surgical History:  Procedure Laterality Date  . CATARACT EXTRACTION W/ INTRAOCULAR LENS  IMPLANT, BILATERAL Bilateral 2006-2007  . INSERT / REPLACE / REMOVE PACEMAKER  06/11/2013  . JOINT REPLACEMENT    . PACEMAKER INSERTION  06/01/13   Dr. Royann Shivers Medtronic device  . PERMANENT PACEMAKER INSERTION N/A 06/11/2013   Procedure: PERMANENT PACEMAKER INSERTION;  Surgeon: Thurmon Fair, MD;  Location: [facility] CATH LAB;  Service: Cardiovascular;  Laterality: N/A;  . REPLACEMENT TOTAL KNEE Right 2010  . TEE WITHOUT CARDIOVERSION N/A 05/17/2013   Procedure: TRANSESOPHAGEAL ECHOCARDIOGRAM (TEE);  Surgeon: Vesta Mixer, MD;  Location: [facility] ENDOSCOPY;  Service: Cardiovascular;  Laterality: N/A;  . TONSILLECTOMY  ~ 1961   HPI:  Ms. ROSHANA SHUFFIELD is a 84 y.o. female with history of PAF not on AC, SSS s/p PPM, hx of stroke, HTN admitted for found down at home, left sided weakness, left facial droop and right gaze preference. No tPA given due to out side window.  Found to have right total MCA, punctate right ACA and left MCA infarcts embolic secondary to Afib not on AC.  Assessment / Plan / Recommendation Clinical Impression  Pt demonstrates moderate dysarthria with intermittnetly intelligible phrase length speech, though pt excessively verbose. Moderate right hemisphere cognitive deficits including decreased sustained attention, easy distractibility, poor awareness of impairment, though improving with interventions. Pt is able to complete  basic functional tasks with assist (wash face, brush teeth, taking sips). Severe right gaze preference impacts success and accuracy. Will continue acute cognitive interventions and education with family when available, recommend f/u at SNF level.     SLP Assessment  SLP Recommendation/Assessment: Patient needs continued Speech Lanaguage Pathology Services SLP Visit Diagnosis: Frontal lobe and executive function deficit Frontal lobe and executive function deficit following: Cerebral infarction    Follow Up Recommendations  Skilled Nursing facility    Frequency and Duration min 2x/week  2 weeks      SLP Evaluation Cognition  Overall Cognitive Status: Impaired/Different from baseline Arousal/Alertness: Awake/alert Orientation Level: Oriented to person;Oriented to situation;Disoriented to time;Oriented to place(Diamond Miller) Attention: Focused;Sustained;Selective Focused Attention: Appears intact Sustained Attention: Impaired Sustained Attention Impairment: Verbal basic Selective Attention: Impaired Selective Attention Impairment: Verbal basic;Functional basic Memory: Impaired Memory Impairment: Decreased recall of new information Awareness: Impaired Awareness Impairment: Intellectual impairment;Emergent impairment Problem Solving: Impaired Problem Solving Impairment: Verbal basic;Functional basic       Comprehension  Auditory Comprehension Overall Auditory Comprehension: Appears within functional limits for tasks assessed    Expression Verbal Expression Overall Verbal Expression: Appears within functional limits for tasks assessed Written Expression Dominant Hand: Right   Oral / Motor  Oral Motor/Sensory Function Overall Oral Motor/Sensory Function: Moderate impairment Facial ROM: Reduced left;Suspected CN VII (facial) dysfunction Facial Symmetry: Abnormal symmetry left;Suspected CN VII (facial) dysfunction Facial Strength: Reduced left;Suspected CN VII (facial)  dysfunction Facial Sensation: Reduced left;Suspected CN V (Trigeminal) dysfunction Lingual Strength: Reduced Motor Speech Overall Motor Speech: Impaired Respiration: Within functional limits Phonation: Low vocal intensity Resonance: Within functional limits Articulation: Impaired Level of Impairment: Word Intelligibility: Intelligibility reduced Word: 0-24% accurate Phrase: 0-24% accurate Sentence: 0-24% accurate Conversation: 0-24% accurate Motor Planning: Witnin functional limits Motor Speech Errors: Consistent Interfering Components: Inadequate dentition   GO                   Harlon DittyBonnie Mayerli Kirst, MA CCC-SLP [phone]  Claudine MoutonDeBlois, Renata Gambino Caroline 08/31/2017, 10:15 AM

## 2017-09-01 ENCOUNTER — Inpatient Hospital Stay (HOSPITAL_COMMUNITY): Payer: PPO

## 2017-09-01 DIAGNOSIS — R1312 Dysphagia, oropharyngeal phase: Secondary | ICD-10-CM

## 2017-09-01 LAB — URINALYSIS, COMPLETE (UACMP) WITH MICROSCOPIC
BILIRUBIN URINE: NEGATIVE
Glucose, UA: NEGATIVE mg/dL
KETONES UR: 5 mg/dL — AB
LEUKOCYTES UA: NEGATIVE
Nitrite: NEGATIVE
PH: 6 (ref 5.0–8.0)
Protein, ur: NEGATIVE mg/dL
Specific Gravity, Urine: 1.012 (ref 1.005–1.030)

## 2017-09-01 LAB — BASIC METABOLIC PANEL
Anion gap: 10 (ref 5–15)
BUN: 20 mg/dL (ref 6–20)
CO2: 23 mmol/L (ref 22–32)
CREATININE: 0.93 mg/dL (ref 0.44–1.00)
Calcium: 9.4 mg/dL (ref 8.9–10.3)
Chloride: 122 mmol/L — ABNORMAL HIGH (ref 101–111)
GFR calc Af Amer: >60 mL/min (ref >60)
GFR, EST NON AFRICAN AMERICAN: 56 mL/min — AB (ref >60)
Glucose, Bld: 111 mg/dL — ABNORMAL HIGH (ref 65–99)
POTASSIUM: 3.3 mmol/L — AB (ref 3.5–5.1)
SODIUM: 155 mmol/L — AB (ref 135–145)

## 2017-09-01 LAB — SODIUM
SODIUM: 156 mmol/L — AB (ref 135–145)
Sodium: 156 mmol/L — ABNORMAL HIGH (ref 135–145)
Sodium: 157 mmol/L — ABNORMAL HIGH (ref 135–145)

## 2017-09-01 LAB — CBC
HCT: 40.9 % (ref 36.0–46.0)
Hemoglobin: 13.5 g/dL (ref 12.0–15.0)
MCH: 30.8 pg (ref 26.0–34.0)
MCHC: 33 g/dL (ref 30.0–36.0)
MCV: 93.4 fL (ref 78.0–100.0)
PLATELETS: 177 10*3/uL (ref 150–400)
RBC: 4.38 MIL/uL (ref 3.87–5.11)
RDW: 14.6 % (ref 11.5–15.5)
WBC: 16.1 10*3/uL — ABNORMAL HIGH (ref 4.0–10.5)

## 2017-09-01 MED ORDER — ACETAMINOPHEN 650 MG RE SUPP
325.0000 mg | RECTAL | Status: DC | PRN
  Administered 2017-09-01: 325 mg via RECTAL
  Filled 2017-09-01: qty 1

## 2017-09-01 MED ORDER — SODIUM CHLORIDE 0.9 % IV SOLN
INTRAVENOUS | Status: DC
  Administered 2017-09-01: 50 mL/h via INTRAVENOUS

## 2017-09-01 NOTE — Progress Notes (Addendum)
STROKE TEAM PROGRESS NOTE   SUBJECTIVE (INTERVAL HISTORY) Her RN and daughter are at the bedside. Pt is lethargic but still talking with eyes closed, although severe dysarthria. She did not pass swallow but she refused NG tube. Daughter stated that she definitely does not want to go to nursing home. I have placed palliative care consultation.   OBJECTIVE Temp:  [98.5 F (36.9 C)-101.1 F (38.4 C)] 101.1 F (38.4 C) (04/12 1600) Pulse Rate:  [85-121] 110 (04/12 2230) Cardiac Rhythm: Atrial fibrillation (04/12 2000) Resp:  [13-36] 35 (04/12 2230) BP: (107-164)/(54-115) 143/95 (04/12 2230) SpO2:  [93 %-100 %] 96 % (04/12 2230)  Recent Labs  Lab 09/14/2017 1747  GLUCAP 103*   Recent Labs  Lab 09/15/2017 1751  09/07/2017 1759  08/30/17 0411  08/31/17 0723  08/31/17 1708 08/31/17 2228 09/01/17 0540 09/01/17 1105 09/01/17 1708  NA 138  --  135   < > 144   < > 161*   < > 158* 156* 155* 156* 156*  K 4.2  --  4.1  --   --   --  2.7*  --   --  3.3* 3.3*  --   --   CL 98*  --  98*  --   --   --  128*  --   --  125* 122*  --   --   CO2  --   --  26  --   --   --  22  --   --  23 23  --   --   GLUCOSE 112*  --  109*  --   --   --  139*  --   --  128* 111*  --   --   BUN 12  --  11  --   --   --  14  --   --  19 20  --   --   CREATININE 1.00  --  1.08*  --  1.03*  --  1.02*  --   --  1.02* 0.93  --   --   CALCIUM  --    < > 9.5  --   --   --  9.2  --   --  9.4 9.4  --   --    < > = values in this interval not displayed.   Recent Labs  Lab 08/26/2017 1759 08/31/17 2228  AST 53* 38  ALT 25 20  ALKPHOS 198* 133*  BILITOT 0.9 1.2  PROT 7.4 6.7  ALBUMIN 3.3* 2.8*   Recent Labs  Lab 08/24/2017 1751 09/10/2017 1759 08/30/17 0411 08/31/17 0723 09/01/17 0540  WBC  --  10.3 10.8* 11.5* 16.1*  NEUTROABS  --  8.0*  --   --   --   HGB 15.6* 15.3* 13.4 13.5 13.5  HCT 46.0 45.6 40.8 41.3 40.9  MCV  --  88.9 91.5 93.2 93.4  PLT  --  174 192 181 177   No results for input(s): CKTOTAL, CKMB,  CKMBINDEX, TROPONINI in the last 168 hours. No results for input(s): LABPROT, INR in the last 72 hours. Recent Labs    09/01/17 0615  COLORURINE YELLOW  LABSPEC 1.012  PHURINE 6.0  GLUCOSEU NEGATIVE  HGBUR MODERATE*  BILIRUBINUR NEGATIVE  KETONESUR 5*  PROTEINUR NEGATIVE  NITRITE NEGATIVE  LEUKOCYTESUR NEGATIVE       Component Value Date/Time   CHOL 108 08/30/2017 0411   TRIG 69 08/30/2017 0411   HDL 35 (L)  08/30/2017 0411   CHOLHDL 3.1 08/30/2017 0411   VLDL 14 08/30/2017 0411   LDLCALC 59 08/30/2017 0411   Lab Results  Component Value Date   HGBA1C 5.3 08/30/2017   No results found for: LABOPIA, COCAINSCRNUR, LABBENZ, AMPHETMU, THCU, LABBARB  No results for input(s): ETH in the last 168 hours.  I have personally reviewed the radiological images below and agree with the radiology interpretations.  Ct Head Wo Contrast  Result Date: 08/30/2017 CLINICAL DATA:  84 year old female with large right MCA infarct. Lethargy, unresponsive. EXAM: CT HEAD WITHOUT CONTRAST TECHNIQUE: Contiguous axial images were obtained from the base of the skull through the vertex without intravenous contrast. COMPARISON:  Head CT 0354 hours today, 2017-09-28. FINDINGS: Brain: A large area well developed cytotoxic edema occupying the right MCA territory, including the right basal ganglia, and prominently involving right MCA/PCA watershed territory is stable since 0354 hours today. There is trace petechial hemorrhage at the level of the right caudate (series 3, image 17). No hemorrhage related mass effect. Cytotoxic edema related mass effect appears stable with subtotal effacement of the right lateral ventricle but only 3 millimeters of leftward midline shift (also image 17). Basilar cisterns remain patent and are unchanged. No ventriculomegaly. Stable gray-white matter differentiation elsewhere, including patchy white matter hypodensity in the left hemisphere, occasional small left MCA territory cortical  encephalomalacia, and chronic small infarcts in the cerebellum. Vascular: Calcified atherosclerosis at the skull base. Asymmetric right MCA hyperdensity re- demonstrated. Skull: No acute osseous abnormality identified. Sinuses/Orbits: Stable bubbly opacity in the left sphenoid sinus. Other: No acute orbit or scalp soft tissue findings. Small volume retained secretions in the nasopharynx. IMPRESSION: 1. Stable non contrast CT appearance of the brain since 0354 hours today. Large, confluent right MCA territory infarct with cytotoxic edema and trace petechial hemorrhage. 2. Effaced right lateral ventricle, but only 3 mm of leftward midline shift at this time, no ventriculomegaly, and basilar cisterns remain patent. 3. No new intracranial abnormality. Electronically Signed   By: Odessa Fleming M.D.   On: 08/30/2017 08:35   Ct Head Wo Contrast  Result Date: 08/30/2017 CLINICAL DATA:  Follow-up examination for acute stroke. EXAM: CT HEAD WITHOUT CONTRAST TECHNIQUE: Contiguous axial images were obtained from the base of the skull through the vertex without intravenous contrast. COMPARISON:  Prior CT from 08/29/2016. FINDINGS: Brain: Continued interval evolution of large volume subacute right MCA territory infarct, with extensive cytotoxic edema seen throughout the majority of the right MCA territory. Overall size and distribution relatively similar to previous. Increasing mass effect with slightly worsened 4 mm of right-to-left shift. Right lateral ventricle partially attenuated. Basilar cisterns remain patent. Possible scattered areas of faint internal petechial hemorrhage without hemorrhagic transformation. No acute intracranial hemorrhage. No new acute large vessel territory infarct. No mass lesion. No extra-axial fluid collection. Underlying atrophy with chronic small vessel ischemic disease again noted. Remote bilateral cerebellar infarcts noted, right greater than left. Vascular: No new hyperdense vessel. Calcified  atherosclerosis noted at the skull base. Skull: Scalp soft tissues and calvarium demonstrate no acute abnormality. Sinuses/Orbits: Globes and orbital soft tissues within normal limits. Mild layering opacity noted within left sphenoid sinus. Paranasal sinuses are otherwise clear. No mastoid effusion. Other: None. IMPRESSION: 1. Continued interval evolution of large subacute right MCA territory infarct, stable in size and distribution as compared to previous. Increasing regional mass effect with worsened 4 mm of right-to-left midline shift. No evidence for hemorrhagic transformation. 2. Otherwise stable appearance of the brain. No new acute  intracranial abnormality identified. Electronically Signed   By: Rise Mu M.D.   On: 08/30/2017 04:34   Mr Brain Wo Contrast  Result Date: 08/30/2017 CLINICAL DATA:  84 year old female with large right MCA infarct. MRI compatible [employer] pacemaker. EXAM: MRI HEAD WITHOUT CONTRAST MRA HEAD WITHOUT CONTRAST TECHNIQUE: Multiplanar, multiecho pulse sequences of the brain and surrounding structures were obtained without intravenous contrast. Angiographic images of the head were obtained using MRA technique without contrast. COMPARISON:  Head CT without contrast 753 hours today and earlier. Brain MRI and intracranial MRA 05/14/2013. FINDINGS: MRI HEAD FINDINGS Brain: Confluent restricted diffusion and cytotoxic edema throughout much of the right hemisphere conforming to the right MCA territory. There is scattered cortical and subcortical white matter restricted diffusion also along the medial right superior frontal gyrus corresponding to some of the ACA territory (axial diffusion image 82). Dense involvement of the right basal ganglia. Subtotal involvement of the right temporal lobe. Extension two some of the right MCA/PCA watershed territory. Involvement of the right cerebral peduncle. Petechial hemorrhage in the right basal ganglia tracking toward the right temporal  stem. Also there does appear to be trace layering hemorrhage in the right occipital horn (SWI image 26). Subtotal effacement of the right lateral ventricle. No ventriculomegaly. Leftward midline shift of 3-4 millimeters. Patent basilar cisterns. In the contralateral left hemisphere there is only a punctate possible area of restricted diffusion in the left corona radiata on diffusion axial image 69. This appears isointense on the ADC. No posterior fossa restricted diffusion. Chronic infarcts in the cerebellar hemispheres, mildly progressed on the left since 2014. Several chronic lacunar infarcts in the left lentiform, some of which were acute on the prior MRI. Associated small areas of cortical encephalomalacia in the left middle frontal gyrus. No other intracranial blood products identified. Cervicomedullary junction and pituitary are within normal limits. Vascular: Abnormal right ICA flow void in the upper neck and throughout the right siphon compatible with slow flow or occlusion (axial T2 image 6). Evidence of reconstituted flow at the right ICA terminus. See MRA findings below. Other Major intracranial vascular flow voids are stable since 2014. Mild generalized intracranial artery tortuosity. Skull and upper cervical spine: Chronic cervical disc degeneration. Normal bone marrow signal. Sinuses/Orbits: Stable and negative. Other: Mastoid air cells are clear. Visible internal auditory structures appear normal. MRA HEAD FINDINGS Antegrade flow in the posterior circulation appears stable to the 2014 MRA. There is bilateral distal vertebral artery and basilar artery irregularity in keeping with atherosclerosis. Moderate to severe left vertebral V4 segment stenosis. No significant basilar artery stenosis. SCA origins are patent. There are fetal type bilateral PCA origins, and there is faint flow signal in the right posterior communicating artery. However, there is chronically decreased flow signal in the right PCA  branches compared to the left. The appearance is stable since 2014. Mild to moderate left P2 and moderate to severe left P3 segment stenoses also appears stable. No flow signal in the cervical right ICA, right ICA siphon, terminus, right MCA, or right ACA. Flow signal in the left ICA siphon appears stable since 2014. The left ophthalmic and posterior communicating artery origins remain patent. The left ICA terminus is patent. The left MCA and ACA origins remain normal. The visible left MCA and ACA branches are stable. No anterior communicating artery flow identified, but the A-comm appear diminutive or absent on the 2014 MRA. IMPRESSION: 1. MRA indicates occlusion of the cervical right ICA, the right ICA terminus, right MCA, and right ACA.  2. Large right hemisphere infarct involving the entirety of the right MCA territory, some of the right MCA/PCA watershed territory (see #4), and also minimally involving the distal right ACA territory. 3. Confluent cytotoxic edema. Right basal ganglia petechial hemorrhage. Intracranial mass effect with leftward midline shift of 3-4 mm is stable from the head CTs earlier today. 4. Chronic posterior circulation atherosclerosis and asymmetrically decreased right PCA flow signal. Moderate to severe distal left vertebral artery stenosis may have progressed since 2014. 5. Punctate acute to subacute white matter infarct in the left corona radiata appears unrelated to #1. No other recent ischemia in the left hemisphere or posterior fossa. 6. No distal left ICA or left anterior circulation stenosis. Electronically Signed   By: Odessa Fleming M.D.   On: 08/30/2017 12:47   Dg Chest Port 1 View  Result Date: 04-Sep-2017 CLINICAL DATA:  84 year old female code stroke. EXAM: PORTABLE CHEST 1 VIEW COMPARISON:  Chest radiographs 05/12/2016 and earlier. FINDINGS: Portable AP semi upright view at 1811 hours. Stable lung volumes. Stable cardiomegaly and mediastinal contours. Chronic left chest cardiac  pacemaker. Allowing for portable technique the lungs are clear. No pneumothorax or pleural effusion. No acute osseous abnormality identified. IMPRESSION: No acute cardiopulmonary abnormality. Electronically Signed   By: Odessa Fleming M.D.   On: 09-04-2017 18:27   Mr Maxine Glenn Head Wo Contrast  Result Date: 08/30/2017 CLINICAL DATA:  84 year old female with large right MCA infarct. MRI compatible [employer] pacemaker. EXAM: MRI HEAD WITHOUT CONTRAST MRA HEAD WITHOUT CONTRAST TECHNIQUE: Multiplanar, multiecho pulse sequences of the brain and surrounding structures were obtained without intravenous contrast. Angiographic images of the head were obtained using MRA technique without contrast. COMPARISON:  Head CT without contrast 753 hours today and earlier. Brain MRI and intracranial MRA 05/14/2013. FINDINGS: MRI HEAD FINDINGS Brain: Confluent restricted diffusion and cytotoxic edema throughout much of the right hemisphere conforming to the right MCA territory. There is scattered cortical and subcortical white matter restricted diffusion also along the medial right superior frontal gyrus corresponding to some of the ACA territory (axial diffusion image 82). Dense involvement of the right basal ganglia. Subtotal involvement of the right temporal lobe. Extension two some of the right MCA/PCA watershed territory. Involvement of the right cerebral peduncle. Petechial hemorrhage in the right basal ganglia tracking toward the right temporal stem. Also there does appear to be trace layering hemorrhage in the right occipital horn (SWI image 26). Subtotal effacement of the right lateral ventricle. No ventriculomegaly. Leftward midline shift of 3-4 millimeters. Patent basilar cisterns. In the contralateral left hemisphere there is only a punctate possible area of restricted diffusion in the left corona radiata on diffusion axial image 69. This appears isointense on the ADC. No posterior fossa restricted diffusion. Chronic infarcts in the  cerebellar hemispheres, mildly progressed on the left since 2014. Several chronic lacunar infarcts in the left lentiform, some of which were acute on the prior MRI. Associated small areas of cortical encephalomalacia in the left middle frontal gyrus. No other intracranial blood products identified. Cervicomedullary junction and pituitary are within normal limits. Vascular: Abnormal right ICA flow void in the upper neck and throughout the right siphon compatible with slow flow or occlusion (axial T2 image 6). Evidence of reconstituted flow at the right ICA terminus. See MRA findings below. Other Major intracranial vascular flow voids are stable since 2014. Mild generalized intracranial artery tortuosity. Skull and upper cervical spine: Chronic cervical disc degeneration. Normal bone marrow signal. Sinuses/Orbits: Stable and negative. Other: Mastoid air cells  are clear. Visible internal auditory structures appear normal. MRA HEAD FINDINGS Antegrade flow in the posterior circulation appears stable to the 2014 MRA. There is bilateral distal vertebral artery and basilar artery irregularity in keeping with atherosclerosis. Moderate to severe left vertebral V4 segment stenosis. No significant basilar artery stenosis. SCA origins are patent. There are fetal type bilateral PCA origins, and there is faint flow signal in the right posterior communicating artery. However, there is chronically decreased flow signal in the right PCA branches compared to the left. The appearance is stable since 2014. Mild to moderate left P2 and moderate to severe left P3 segment stenoses also appears stable. No flow signal in the cervical right ICA, right ICA siphon, terminus, right MCA, or right ACA. Flow signal in the left ICA siphon appears stable since 2014. The left ophthalmic and posterior communicating artery origins remain patent. The left ICA terminus is patent. The left MCA and ACA origins remain normal. The visible left MCA and ACA  branches are stable. No anterior communicating artery flow identified, but the A-comm appear diminutive or absent on the 2014 MRA. IMPRESSION: 1. MRA indicates occlusion of the cervical right ICA, the right ICA terminus, right MCA, and right ACA. 2. Large right hemisphere infarct involving the entirety of the right MCA territory, some of the right MCA/PCA watershed territory (see #4), and also minimally involving the distal right ACA territory. 3. Confluent cytotoxic edema. Right basal ganglia petechial hemorrhage. Intracranial mass effect with leftward midline shift of 3-4 mm is stable from the head CTs earlier today. 4. Chronic posterior circulation atherosclerosis and asymmetrically decreased right PCA flow signal. Moderate to severe distal left vertebral artery stenosis may have progressed since 2014. 5. Punctate acute to subacute white matter infarct in the left corona radiata appears unrelated to #1. No other recent ischemia in the left hemisphere or posterior fossa. 6. No distal left ICA or left anterior circulation stenosis. Electronically Signed   By: Odessa FlemingH  Hall M.D.   On: 08/30/2017 12:47   Ct Head Code Stroke Wo Contrast  Result Date: 09/07/2017 CLINICAL DATA:  Code stroke.  Left-sided deficits EXAM: CT HEAD WITHOUT CONTRAST TECHNIQUE: Contiguous axial images were obtained from the base of the skull through the vertex without intravenous contrast. COMPARISON:  Head CT 03/03/2014 FINDINGS: Brain: No mass lesion or acute hemorrhage. There is a large amount of hypoattenuation throughout the right MCA territory with mild mass effect on the right lateral ventricle. No acute hemorrhage. There is an old left basal ganglia lacunar infarct. No hydrocephalus. Old right cerebellar infarcts are unchanged. Vascular: No hyperdense vessel. No advanced atherosclerotic calcification of the arteries at the skull base. Skull: Normal visualized skull base, calvarium and extracranial soft tissues. Sinuses/Orbits: Fluid in the  left sphenoid sinus.  Normal orbits. ASPECTS [facility]([place] Stroke Program Early CT Score) - Ganglionic level infarction (caudate, lentiform nuclei, internal capsule, insula, M1-M3 cortex): 0 - Supraganglionic infarction (M4-M6 cortex): 0 Total score (0-10 with 10 being normal): 0 IMPRESSION: 1. Nonhemorrhagic acute to early subacute infarct of the right MCA territory. 2. ASPECTS is zero. These results were communicated to Dr. Caryl PinaEric Lindzen at 6:07 pm on 09/09/2017 by text page via the [facility] messaging system. Electronically Signed   By: Deatra RobinsonKevin  Herman M.D.   On: 2018-01-24 18:07   Carotid Doppler Left 1-39% ICA stenosis.  Right side technically difficult due to patient holding head over to right side. High resistant /spiked waveforms noted in the Right CCA and ICA prox/ mid, suggestive of  a more distal obstruction or occlusion.  TTE pending - Left ventricle: The cavity size was normal. Systolic function was   vigorous. The estimated ejection fraction was in the range of 65%   to 70%. Wall motion was normal; there were no regional wall   motion abnormalities. The study was not technically sufficient to   allow evaluation of LV diastolic dysfunction due to atrial   fibrillation. - Aortic valve: There was mild regurgitation. - Aortic root: The aortic root was normal in size. - Mitral valve: There was mild regurgitation. - Left atrium: The atrium was normal in size. - Right ventricle: Pacer wire or catheter noted in right ventricle.   Systolic function was normal. - Right atrium: Pacer wire or catheter noted in right atrium. - Tricuspid valve: There was moderate regurgitation. - Pulmonary arteries: Systolic pressure was mildly increased. PA   peak pressure: 32 mm Hg (S). - Inferior vena cava: The vessel was dilated. The respirophasic   diameter changes were blunted (< 50%), consistent with elevated   central venous pressure. - Pericardium, extracardiac: There was no pericardial effusion.\  Ct Head Wo  Contrast  Result Date: 09/01/2017 CLINICAL DATA:  84 year old female status post right ICA, MCA and ACA occlusion in cerebral infarcts. EXAM: CT HEAD WITHOUT CONTRAST TECHNIQUE: Contiguous axial images were obtained from the base of the skull through the vertex without intravenous contrast. COMPARISON:  Brain MRI and intracranial MRA, and noncontrast head CT 08/30/2017 and earlier. FINDINGS: Brain: Confluent cytotoxic edema throughout the right MCA, and marginally involving the right ACA and PCA territories as before. Stable petechial hemorrhage at the right basal ganglia. Intracranial mass effect has increased. Midline shift to the left is increased from 3-4 mm now to 8-9 mm (coronal image 31). Effacement of the right lateral ventricle has progressed. Left lateral ventricle size remains stable. Basilar cisterns are stable. No new cerebral edema or new intracranial hemorrhage identified. Small chronic infarcts in the right cerebellum and left MCA territory. Vascular: Calcified atherosclerosis at the skull base. Skull: Stable.  No acute osseous abnormality identified. Sinuses/Orbits: Stable.  Bubbly opacity in the left sphenoid sinus. Other: Stable orbit and scalp soft tissues. IMPRESSION: 1. Stable extent of the large right hemisphere infarct, and stable petechial hemorrhage. But progressed intracranial mass effect since 08/30/2017. Leftward midline shift is increased to 8-9 mm. Lateral ventricle effacement has increased, but there is no ventriculomegaly. 2. Stable basilar cistern patency. 3. No new intracranial abnormality. Electronically Signed: By: Odessa Fleming M.D. On: 09/01/2017 07:08    PHYSICAL EXAM  Temp:  [98.5 F (36.9 C)-101.1 F (38.4 C)] 101.1 F (38.4 C) (04/12 1600) Pulse Rate:  [85-121] 110 (04/12 2230) Resp:  [13-36] 35 (04/12 2230) BP: (107-164)/(54-115) 143/95 (04/12 2230) SpO2:  [93 %-100 %] 96 % (04/12 2230)  General - Well nourished, well developed, lethargic.  Ophthalmologic -  fundi not visualized due to pinpoint pupils.  Cardiovascular - irregularly irregular heart rate and rhythm.  Neuro - lethargic, eyes closed, able to tell me her name and age, orientated to people, but not orientated to place and time, severe dysarthria. Follows simple central and peripheral commands. Eyes deviated to right, not crossing midline, not blinking to visual threat bilaterally. Bilateral small pupils, sluggish to light. Left facial droop, tongue midline in mouth. Left UE and LE flaccid. RUE follows simple commands, 3/5 strength and RLE withdraw to pain. DTR diminished and no babinski. Sensation, coordination and gait not tested.   ASSESSMENT/PLAN [name]  is a 84 y.o. female with history of PAF not on AC, SSS s/p PPM, hx of stroke, HTN admitted for found down at home, left sided weakness, left facial droop and right gaze preference. No tPA given due to out side window.    Stroke:  right total MCA, punctate right ACA and left MCA infarcts embolic secondary to Afib not on AC  Resultant right gaze, left hemiplegia, left facial droop  MRI  Right large MCA, puncate right ACA and left MCA infarcts  MRA  Right ICA, MCA, ACA occluded. Severe stenosis left VA  Carotid Doppler right ICA and CCA resistant waveform, suggesting distal occlusion  2D Echo EF 65-70%  LDL 59  HgbA1c 5.3  Heparin subq for VTE prophylaxis Diet NPO time specified  Fall precautions   clopidogrel 75 mg daily prior to admission, now on aspirin 300 mg suppository daily.   Ongoing aggressive stroke risk factor management  Therapy recommendations:  SNF  Disposition:  Pending - palliative care consult requested  Cerebral edema  Stable midline shift on MRI  Repeat CT 09/01/17 in am showed increased midline shift  Off 3% saline, now change NS  Na 144->149->156->161->155  Na goal 150-155  Na Q6h  PAF not on AC  PAF detected on PPM  Follows with Dr. Rubie Maid  Fall risk and pt has  declined AC  On plavix at home  Rate elevated - on IV metoprolol  Close monitoring  Hx of stroke  04/2013 - numbness, AMS, slurry speech. MRI left MCA infarct, old right CR infarct and old right cerebellar infarcts. On plavix (no ASA due to primary biliary cirrhosis). TEE no PFO, unremarkable. MRA intracranial athero. CUS and TTE unremarkable. EKG no afib.  Fever  Tmax 101.1  Likely due to aspiration   NT suctioning PRN  Close montioring  Hypertension Stable  Long term BP goal normotensive  Hyperlipidemia  Home meds:  lipitor 40   LDL 59, goal < 70  Continue statin once po access  Dysphagia   Did not pass swallow  NPO for now  Pt refused NG tube   Other Stroke Risk Factors  Advanced age  Obesity, Body mass index is 31.84 kg/m.   SSS on PPM  Other Active Problems  Cognitive impairment recently  Daughter is considering palliative care  Palliative care consult requested  Hospital day # 3  This patient is critically ill due to right large MCA, right ICA occlusion, cerebral edema, afib RVR and at significant risk of neurological worsening, death form brain herniation, hemorrhagic infarct, heart failure, seizure. This patient's care requires constant monitoring of vital signs, hemodynamics, respiratory and cardiac monitoring, review of multiple databases, neurological assessment, discussion with family, other specialists and medical decision making of high complexity. I had long discussion with sister and daughter at bedside, updated pt current condition, treatment plan and potential prognosis. They expressed understanding and appreciation. I spent 35 minutes of neurocritical care time in the care of this patient.   Marvel Plan, MD PhD Stroke Neurology 09/01/2017 10:57 PM    To contact Stroke Continuity provider, please refer to [email or web address]. After hours, contact General Neurology

## 2017-09-01 NOTE — Progress Notes (Signed)
Occupational Therapy Evaluation (late entry)  Pt admitted with the below listed diagnosis and demonstrates the below listed deficits.  She demonstrates Lt hemiplegia with extreme Rt gaze preference and Lt neglect as well as poor trunk control.  She demonstrates focused attention, as well as ideational apraxia.  She requires total A for all aspects of ADLs and functional mobility.    No family is present to provide PLOF info.   Anticipate she will need SNF at discharge.    Recommend SNF    08/31/17 1600  OT Visit Information  Last OT Received On 08/31/17  Assistance Needed +2  PT/OT/SLP Co-Evaluation/Treatment Yes  Reason for Co-Treatment Complexity of the patient's impairments (multi-system involvement)  OT goals addressed during session ADL's and self-care  History of Present Illness pt is an [age] y/o female with pmh significant for L CVA, COPD, DDD, HTN, pacer, R TKA, admitted to ED with Left sided weakness, left facial droop and R gaze deviation.  MRI shows Large right hemisphere infarct involving the entirety of the  Precautions  Precautions Fall  Home Living  Family/patient expects to be discharged to: Unsure  Additional Comments no family available to gather home information or PLOF  Prior Function  Comments Per chart, pt was independent.  Pt unable to provide info   Communication  Communication Other (comment) (Pt very dysarthric - difficult to understand )  Cognition  Arousal/Alertness Lethargic;Awake/alert  Behavior During Therapy Flat affect  Overall Cognitive Status Impaired/Different from baseline  Area of Impairment Orientation;Attention;Memory;Following commands;Safety/judgement;Awareness;Problem solving  Orientation Level Time (needs some cuing)  Current Attention Level Focused  Following Commands Follows one step commands inconsistently  Safety/Judgement Decreased awareness of safety  Awareness Intellectual  Problem Solving Slow processing;Decreased  initiation;Difficulty sequencing;Requires verbal cues;Requires tactile cues  General Comments extreme neglect toward the left side.  Pt is oriented to stroke that affected her Lt side, MC, and to herself.    Upper Extremity Assessment  LUE Deficits / Details flaccid - no active movement noted.    LUE Sensation decreased proprioception (per observation )  Cervical / Trunk Assessment  Cervical / Trunk Assessment Kyphotic (scoliotic)  ADL  Overall ADL's  Needs assistance/impaired  Eating/Feeding NPO  Grooming Wash/dry hands;Wash/dry face;Oral care;Total assistance;Bed level  Grooming Details (indicate cue type and reason) Pt unable to identify a toothbrush, nor demonstrate its use with hand over hand assistance   Upper Body Bathing Total assistance;Bed level  Lower Body Bathing Total assistance;Bed level  Upper Body Dressing  Total assistance;Bed level  Lower Body Dressing Total assistance;Bed level  Toilet Transfer Total assistance  Toileting- Clothing Manipulation and Hygiene Total assistance;Bed level  Functional mobility during ADLs Total assistance;+2 for physical assistance  Perception  Perception Tested? Yes  Perception Deficits Inattention/neglect  Inattention/Neglect Does not attend to left visual field;Does not attend to left side of body  Spatial deficits Rt gaze preference   Praxis  Praxis tested? Deficits  Deficits Ideation;Ideomotor  Bed Mobility  Overal bed mobility Needs Assistance  Bed Mobility Supine to Sit;Sit to Supine  Supine to sit Total assist;+2 for physical assistance  Sit to supine Total assist;+2 for physical assistance  General bed mobility comments no discernable truncal assist during transition from supine to sit and not a coordinated assist sit to supine  Transfers  General transfer comment unable to safely attempt   Balance  Overall balance assessment Needs assistance  Sitting-balance support Feet supported;Feet unsupported;Single extremity  supported  Sitting balance-Leahy Scale Zero  Sitting balance - Comments  pt needed significant truncal assist/stability to stay relatively upright ( in a posterior pelvil tilted posture).  No purposefull truncal movements noted.  General Comments  General comments (skin integrity, edema, etc.) HR to 115-143  OT - End of Session  Activity Tolerance Patient tolerated treatment well  Patient left in bed;with call bell/phone within reach;with family/visitor present  OT Assessment  OT Recommendation/Assessment Patient needs continued OT Services  OT Visit Diagnosis Apraxia (R48.2);Cognitive communication deficit (R41.841);Hemiplegia and hemiparesis  Symptoms and signs involving cognitive functions Cerebral infarction  Hemiplegia - Right/Left Left  Hemiplegia - dominant/non-dominant Non-Dominant  Hemiplegia - caused by Cerebral infarction  OT Problem List Decreased strength;Decreased range of motion;Decreased activity tolerance;Impaired balance (sitting and/or standing);Impaired vision/perception;Decreased coordination;Decreased cognition;Decreased safety awareness;Decreased knowledge of use of DME or AE;Impaired tone;Impaired UE functional use  Barriers to Discharge Decreased caregiver support  OT Plan  OT Frequency (ACUTE ONLY) Min 2X/week  OT Treatment/Interventions (ACUTE ONLY) Self-care/ADL training;Neuromuscular education;DME and/or AE instruction;Therapeutic activities;Splinting;Cognitive remediation/compensation;Visual/perceptual remediation/compensation;Patient/family education;Balance training  AM-PAC OT "6 Clicks" Daily Activity Outcome Measure  Help from another person eating meals? 1  Help from another person taking care of personal grooming? 1  Help from another person toileting, which includes using toliet, bedpan, or urinal? 1  Help from another person bathing (including washing, rinsing, drying)? 1  Help from another person to put on and taking off regular upper body clothing? 1   Help from another person to put on and taking off regular lower body clothing? 1  6 Click Score 6  ADL G Code Conversion CN  OT Recommendation  Follow Up Recommendations SNF;Supervision/Assistance - 24 hour  OT Equipment None recommended by OT  Individuals Consulted  Consulted and Agree with Results and Recommendations Patient unable/family or caregiver not available  Acute Rehab OT Goals  Patient Stated Goal pt unable  OT Goal Formulation Patient unable to participate in goal setting  Time For Goal Achievement 09/15/17  Potential to Achieve Goals Good  OT Time Calculation  OT Start Time (ACUTE ONLY) 1440  OT Stop Time (ACUTE ONLY) 1501  OT Time Calculation (min) 21 min  OT General Charges  $OT Visit 1 Visit  OT Evaluation  $OT Eval Moderate Complexity 1 Mod  Written Expression  Dominant Hand Right  Jeani Hawking, OTR/L [phone]

## 2017-09-01 NOTE — Procedures (Signed)
Objective Swallowing Evaluation: Type of Study: FEES-Fiberoptic Endoscopic Evaluation of Swallow   Patient Details  Name: Diamond Miller MRN: [id number] Date of Birth: [birth year]  Today's Date: 09/01/2017 Time: SLP Start Time (ACUTE ONLY): 1310 -SLP Stop Time (ACUTE ONLY): 1342  SLP Time Calculation (min) (ACUTE ONLY): 32 min   Past Medical History:  Past Medical History:  Diagnosis Date  . Acid reflux   . Anemia 1957  . Anxiety   . Chronic back pain   . COPD (chronic obstructive pulmonary disease) (HCC)   . Degenerative disc disease, cervical   . Dysrhythmia    "skips" (06/12/2013)  . Exertional shortness of breath    "recently" (06/12/2013)  . High cholesterol   . History of blood transfusion    "think it was when I had knee OR" (06/12/2013)  . Hypertension   . Liver cirrhosis (HCC)   . Pacemaker, Medtronic placed 06/01/13 06/11/2013  . Scoliosis   . Stroke (HCC) 05/14/2013   denies residual on 06/12/2013   Past Surgical History:  Past Surgical History:  Procedure Laterality Date  . CATARACT EXTRACTION W/ INTRAOCULAR LENS  IMPLANT, BILATERAL Bilateral 2006-2007  . INSERT / REPLACE / REMOVE PACEMAKER  06/11/2013  . JOINT REPLACEMENT    . PACEMAKER INSERTION  06/01/13   Dr. Royann Shivers Medtronic device  . PERMANENT PACEMAKER INSERTION N/A 06/11/2013   Procedure: PERMANENT PACEMAKER INSERTION;  Surgeon: Thurmon Fair, MD;  Location: [facility] CATH LAB;  Service: Cardiovascular;  Laterality: N/A;  . REPLACEMENT TOTAL KNEE Right 2010  . TEE WITHOUT CARDIOVERSION N/A 05/17/2013   Procedure: TRANSESOPHAGEAL ECHOCARDIOGRAM (TEE);  Surgeon: Vesta Mixer, MD;  [facility] ENDOSCOPY;  Service: Cardiovascular;  Laterality: N/A;  . TONSILLECTOMY  ~ 1961   HPI: [name] is a 84 y.o. female with history of PAF not on AC, SSS s/p PPM, hx of stroke, HTN admitted for found down at home, left sided weakness, left facial droop and right gaze preference. No tPA given due to out  side window.  Found to have right total MCA, punctate right ACA and left MCA infarcts embolic secondary to Afib not on AC.    No data recorded   Assessment / Plan / Recommendation  CHL IP CLINICAL IMPRESSIONS 09/01/2017  Clinical Impression Pt demonstrates severe dysphagia with silent penetration of all textures with only minimal trials. Right leaning posture, oral lingual and buccal weakness and impaired mentation result in severe oral residue and spillage of boluses to the right lateral channel with penetration to the cords during the swallow. There is decreased hyolaryngeal mobility and poor laryngeal closure during the swallow. Penetration is silent but even with max cues pt could only achieve one cough/throat clear forceful enough to moblize penetrate. Pt could participate in swallowing therapy with 3-4 nectar teaspoon boluses, but POs given any greater quantity will result in aspiration. Cannot recommend safe PO diet, and prognosis for rapid recovery of function is poor. Suggest consideration of comfort feeding vs prolonged NPO status.   SLP Visit Diagnosis Frontal lobe and executive function deficit  Attention and concentration deficit following --  Frontal lobe and executive function deficit following Cerebral infarction  Impact on safety and function Severe aspiration risk;Risk for inadequate nutrition/hydration      CHL IP TREATMENT RECOMMENDATION 09/01/2017  Treatment Recommendations Therapy as outlined in treatment plan below     Prognosis 09/01/2017  Prognosis for Safe Diet Advancement Guarded  Barriers to Reach Goals Severity of deficits  Barriers/Prognosis Comment --  CHL IP DIET RECOMMENDATION 09/01/2017  SLP Diet Recommendations Other (Comment)  Liquid Administration via Spoon  Medication Administration Via alternative means  Compensations --  Postural Changes --      CHL IP OTHER RECOMMENDATIONS 09/01/2017  Recommended Consults --  Oral Care Recommendations Oral care  QID  Other Recommendations --      CHL IP FOLLOW UP RECOMMENDATIONS 09/01/2017  Follow up Recommendations Skilled Nursing facility      [facility] FREQUENCY AND DURATION 09/01/2017  Speech Therapy Frequency (ACUTE ONLY) min 2x/week  Treatment Duration --           CHL IP ORAL PHASE 09/01/2017  Oral Phase Impaired  Oral - Pudding Teaspoon --  Oral - Pudding Cup --  Oral - Honey Teaspoon --  Oral - Honey Cup --  Oral - Nectar Teaspoon Weak lingual manipulation;Reduced posterior propulsion;Delayed oral transit;Decreased bolus cohesion;Premature spillage;Lingual/palatal residue;Right anterior bolus loss  Oral - Nectar Cup --  Oral - Nectar Straw --  Oral - Thin Teaspoon --  Oral - Thin Cup --  Oral - Thin Straw --  Oral - Puree Weak lingual manipulation;Reduced posterior propulsion;Delayed oral transit;Decreased bolus cohesion;Premature spillage;Lingual/palatal residue;Right anterior bolus loss  Oral - Mech Soft --  Oral - Regular --  Oral - Multi-Consistency --  Oral - Pill --  Oral Phase - Comment --    CHL IP PHARYNGEAL PHASE 09/01/2017  Pharyngeal Phase Impaired  Pharyngeal- Pudding Teaspoon --  Pharyngeal --  Pharyngeal- Pudding Cup --  Pharyngeal --  Pharyngeal- Honey Teaspoon --  Pharyngeal --  Pharyngeal- Honey Cup --  Pharyngeal --  Pharyngeal- Nectar Teaspoon Penetration/Aspiration during swallow;Pharyngeal residue - valleculae;Lateral channel residue;Reduced epiglottic inversion;Reduced anterior laryngeal mobility;Reduced airway/laryngeal closure;Reduced tongue base retraction;Delayed swallow initiation-pyriform sinuses;Reduced laryngeal elevation  Pharyngeal Material enters airway, CONTACTS cords and not ejected out  Pharyngeal- Nectar Cup --  Pharyngeal --  Pharyngeal- Nectar Straw --  Pharyngeal --  Pharyngeal- Thin Teaspoon --  Pharyngeal --  Pharyngeal- Thin Cup --  Pharyngeal --  Pharyngeal- Thin Straw --  Pharyngeal --  Pharyngeal- Puree  Penetration/Aspiration during swallow;Pharyngeal residue - valleculae;Lateral channel residue;Reduced epiglottic inversion;Reduced anterior laryngeal mobility;Reduced airway/laryngeal closure;Reduced tongue base retraction;Delayed swallow initiation-pyriform sinuses;Reduced laryngeal elevation  Pharyngeal Material enters airway, CONTACTS cords and not ejected out  Pharyngeal- Mechanical Soft --  Pharyngeal --  Pharyngeal- Regular --  Pharyngeal --  Pharyngeal- Multi-consistency --  Pharyngeal --  Pharyngeal- Pill --  Pharyngeal --  Pharyngeal Comment --     No flowsheet data found.  No flowsheet data found. Harlon DittyBonnie Davan Hark, MA CCC-SLP [phone]  Claudine MoutonDeBlois, Arrin Pintor Caroline 09/01/2017, 2:07 PM

## 2017-09-01 NOTE — Progress Notes (Signed)
  Speech Language Pathology Treatment: Dysphagia;Cognitive-Linquistic  Patient Details Name: Dyann RuddleClara L Haseley MRN: [id number] DOB: [birth year] Today's Date: 09/01/2017 Time: [id number] SLP Time Calculation (min) (ACUTE ONLY): 35 min  Assessment / Plan / Recommendation Clinical Impression  Pt demonstrated stable arousal today, worsened management of secretions requiring cues for expectoration with oral case and suction. Somewhat more intelligible at word and phrase level, improved with verbal cues to repeat single words with increased volume. More intelligible to daughter. Pt verbalized orientation to place, self and situation, and basic intellectual awareness of deficits, but poor emergent awareness. States she is aware she cant swallow, but request chicken livers and mashed potatoes. Will proceed with FEES to guide plan of care with expectation pt will need alternate method of nutrition.   HPI HPI: Ms. Dyann RuddleClara L Divita is a 84 y.o. female with history of PAF not on AC, SSS s/p PPM, hx of stroke, HTN admitted for found down at home, left sided weakness, left facial droop and right gaze preference. No tPA given due to out side window.  Found to have right total MCA, punctate right ACA and left MCA infarcts embolic secondary to Afib not on AC.       SLP Plan  (FEES)       Recommendations  Diet recommendations: NPO                Oral Care Recommendations: Oral care BID Follow up Recommendations: Skilled Nursing facility SLP Visit Diagnosis: Frontal lobe and executive function deficit Frontal lobe and executive function deficit following: Cerebral infarction Plan: (FEES)       GO               Harlon DittyBonnie Ellajane Stong, MA CCC-SLP [phone]  Claudine MoutonDeBlois, Armonie Staten Caroline 09/01/2017, 9:57 AM

## 2017-09-02 DIAGNOSIS — R509 Fever, unspecified: Secondary | ICD-10-CM

## 2017-09-02 DIAGNOSIS — I4819 Other persistent atrial fibrillation: Secondary | ICD-10-CM

## 2017-09-02 DIAGNOSIS — I63411 Cerebral infarction due to embolism of right middle cerebral artery: Secondary | ICD-10-CM

## 2017-09-02 DIAGNOSIS — Z7189 Other specified counseling: Secondary | ICD-10-CM

## 2017-09-02 DIAGNOSIS — R1312 Dysphagia, oropharyngeal phase: Secondary | ICD-10-CM

## 2017-09-02 DIAGNOSIS — Z515 Encounter for palliative care: Secondary | ICD-10-CM

## 2017-09-02 LAB — BASIC METABOLIC PANEL
Anion gap: 6 (ref 5–15)
BUN: 30 mg/dL — AB (ref 6–20)
CALCIUM: 9 mg/dL (ref 8.9–10.3)
CO2: 26 mmol/L (ref 22–32)
CREATININE: 1.07 mg/dL — AB (ref 0.44–1.00)
Chloride: 124 mmol/L — ABNORMAL HIGH (ref 101–111)
GFR calc Af Amer: 54 mL/min — ABNORMAL LOW (ref >60)
GFR, EST NON AFRICAN AMERICAN: 47 mL/min — AB (ref >60)
GLUCOSE: 101 mg/dL — AB (ref 65–99)
Potassium: 3.4 mmol/L — ABNORMAL LOW (ref 3.5–5.1)
Sodium: 156 mmol/L — ABNORMAL HIGH (ref 135–145)

## 2017-09-02 LAB — CBC
HCT: 38.5 % (ref 36.0–46.0)
Hemoglobin: 12.3 g/dL (ref 12.0–15.0)
MCH: 30.2 pg (ref 26.0–34.0)
MCHC: 31.9 g/dL (ref 30.0–36.0)
MCV: 94.6 fL (ref 78.0–100.0)
PLATELETS: 175 10*3/uL (ref 150–400)
RBC: 4.07 MIL/uL (ref 3.87–5.11)
RDW: 14.5 % (ref 11.5–15.5)
WBC: 12.8 10*3/uL — AB (ref 4.0–10.5)

## 2017-09-02 MED ORDER — GLYCOPYRROLATE 0.2 MG/ML IJ SOLN
0.2000 mg | INTRAMUSCULAR | Status: DC | PRN
  Administered 2017-09-02: 0.2 mg via INTRAVENOUS
  Administered 2017-09-03: 0.4 mg via INTRAVENOUS
  Filled 2017-09-02: qty 1
  Filled 2017-09-02: qty 2

## 2017-09-02 MED ORDER — MORPHINE SULFATE (PF) 4 MG/ML IV SOLN
1.0000 mg | INTRAVENOUS | Status: DC | PRN
  Administered 2017-09-03 (×3): 2 mg via INTRAVENOUS
  Filled 2017-09-02 (×3): qty 1

## 2017-09-02 MED ORDER — LORAZEPAM 2 MG/ML IJ SOLN
0.5000 mg | INTRAMUSCULAR | Status: DC | PRN
Start: 2017-09-02 — End: 2017-09-03

## 2017-09-02 NOTE — Progress Notes (Signed)
[facility]ia[facility]:  RN visit  Received request from Maralyn SagoSarah, NP PMT, for [facility] as [facility] as [facility].  Chart reviewed and patient approved [facility].  Spoke with Luster LandsbergRenee, daughter, at [phone] to confirm interest and explain services.  Family agreeable to transfer tomorrow.  Clarisse GougeBridget, CSW, aware.  Rutherford Nail(Isabel to take over CSW 09/16/2017).  Registration paperwork will be completed tomorrow at 0830 am.  Dr. Barbee ShropshireHertweck to assume care per family request.  Please fax discharge summary to [phone].  RN, please call report to [phone].  Please arrange transport for patient to arrive as soon as possible after paperwork is completed on 09/09/2017.  Thank you for this referral.   Adele BarthelAmy Evans, RN, BSN [facility]iaison [phone]  All hospital liaisons are now on AMION.

## 2017-09-02 NOTE — Progress Notes (Signed)
STROKE TEAM PROGRESS NOTE   SUBJECTIVE (INTERVAL HISTORY) Her RN and palliative care NP Huntley Dec are at the bedside. Pt is lethargic, mumbling sounds, but able to follow simple commands with RUE and RLE. She did not pass swallow but she refused NG tube. Palliative care NP discussed with daughter and will proceed with comfort care measures.   OBJECTIVE Temp:  [98.7 F (37.1 C)-101.1 F (38.4 C)] 98.7 F (37.1 C) (04/13 0744) Pulse Rate:  [44-121] 103 (04/13 0700) Cardiac Rhythm: Atrial fibrillation (04/12 2000) Resp:  [13-38] 27 (04/13 0700) BP: (107-167)/(54-115) 115/84 (04/13 0700) SpO2:  [92 %-100 %] 100 % (04/13 0700)  Recent Labs  Lab 2017/09/06 1747  GLUCAP 103*   Recent Labs  Lab 09-06-2017 1759  08/30/17 0411  08/31/17 0723  08/31/17 2228 09/01/17 0540 09/01/17 1105 09/01/17 1708 09/01/17 2237 09/02/17 0512  NA 135   < > 144   < > 161*   < > 156* 155* 156* 156* 157* 156*  K 4.1  --   --   --  2.7*  --  3.3* 3.3*  --   --   --  3.4*  CL 98*  --   --   --  128*  --  125* 122*  --   --   --  124*  CO2 26  --   --   --  22  --  23 23  --   --   --  26  GLUCOSE 109*  --   --   --  139*  --  128* 111*  --   --   --  101*  BUN 11  --   --   --  14  --  19 20  --   --   --  30*  CREATININE 1.08*  --  1.03*  --  1.02*  --  1.02* 0.93  --   --   --  1.07*  CALCIUM 9.5  --   --   --  9.2  --  9.4 9.4  --   --   --  9.0   < > = values in this interval not displayed.   Recent Labs  Lab 2017/09/06 1759 08/31/17 2228  AST 53* 38  ALT 25 20  ALKPHOS 198* 133*  BILITOT 0.9 1.2  PROT 7.4 6.7  ALBUMIN 3.3* 2.8*   Recent Labs  Lab September 06, 2017 1759 08/30/17 0411 08/31/17 0723 09/01/17 0540 09/02/17 0512  WBC 10.3 10.8* 11.5* 16.1* 12.8*  NEUTROABS 8.0*  --   --   --   --   HGB 15.3* 13.4 13.5 13.5 12.3  HCT 45.6 40.8 41.3 40.9 38.5  MCV 88.9 91.5 93.2 93.4 94.6  PLT 174 192 181 177 175   No results for input(s): CKTOTAL, CKMB, CKMBINDEX, TROPONINI in the last 168 hours. No  results for input(s): LABPROT, INR in the last 72 hours. Recent Labs    09/01/17 0615  COLORURINE YELLOW  LABSPEC 1.012  PHURINE 6.0  GLUCOSEU NEGATIVE  HGBUR MODERATE*  BILIRUBINUR NEGATIVE  KETONESUR 5*  PROTEINUR NEGATIVE  NITRITE NEGATIVE  LEUKOCYTESUR NEGATIVE       Component Value Date/Time   CHOL 108 08/30/2017 0411   TRIG 69 08/30/2017 0411   HDL 35 (L) 08/30/2017 0411   CHOLHDL 3.1 08/30/2017 0411   VLDL 14 08/30/2017 0411   LDLCALC 59 08/30/2017 0411   Lab Results  Component Value Date   HGBA1C 5.3 08/30/2017   No  results found for: LABOPIA, COCAINSCRNUR, LABBENZ, AMPHETMU, THCU, LABBARB  No results for input(s): ETH in the last 168 hours.  I have personally reviewed the radiological images below and agree with the radiology interpretations.  Ct Head Wo Contrast  Result Date: 08/30/2017 CLINICAL DATA:  84 year old female with large right MCA infarct. Lethargy, unresponsive. EXAM: CT HEAD WITHOUT CONTRAST TECHNIQUE: Contiguous axial images were obtained from the base of the skull through the vertex without intravenous contrast. COMPARISON:  Head CT 0354 hours today, 10/31/2017. FINDINGS: Brain: A large area well developed cytotoxic edema occupying the right MCA territory, including the right basal ganglia, and prominently involving right MCA/PCA watershed territory is stable since 0354 hours today. There is trace petechial hemorrhage at the level of the right caudate (series 3, image 17). No hemorrhage related mass effect. Cytotoxic edema related mass effect appears stable with subtotal effacement of the right lateral ventricle but only 3 millimeters of leftward midline shift (also image 17). Basilar cisterns remain patent and are unchanged. No ventriculomegaly. Stable gray-white matter differentiation elsewhere, including patchy white matter hypodensity in the left hemisphere, occasional small left MCA territory cortical encephalomalacia, and chronic small infarcts in the  cerebellum. Vascular: Calcified atherosclerosis at the skull base. Asymmetric right MCA hyperdensity re- demonstrated. Skull: No acute osseous abnormality identified. Sinuses/Orbits: Stable bubbly opacity in the left sphenoid sinus. Other: No acute orbit or scalp soft tissue findings. Small volume retained secretions in the nasopharynx. IMPRESSION: 1. Stable non contrast CT appearance of the brain since 0354 hours today. Large, confluent right MCA territory infarct with cytotoxic edema and trace petechial hemorrhage. 2. Effaced right lateral ventricle, but only 3 mm of leftward midline shift at this time, no ventriculomegaly, and basilar cisterns remain patent. 3. No new intracranial abnormality. Electronically Signed   By: Odessa FlemingH  Hall M.D.   On: 08/30/2017 08:35   Ct Head Wo Contrast  Result Date: 08/30/2017 CLINICAL DATA:  Follow-up examination for acute stroke. EXAM: CT HEAD WITHOUT CONTRAST TECHNIQUE: Contiguous axial images were obtained from the base of the skull through the vertex without intravenous contrast. COMPARISON:  Prior CT from 08/29/2016. FINDINGS: Brain: Continued interval evolution of large volume subacute right MCA territory infarct, with extensive cytotoxic edema seen throughout the majority of the right MCA territory. Overall size and distribution relatively similar to previous. Increasing mass effect with slightly worsened 4 mm of right-to-left shift. Right lateral ventricle partially attenuated. Basilar cisterns remain patent. Possible scattered areas of faint internal petechial hemorrhage without hemorrhagic transformation. No acute intracranial hemorrhage. No new acute large vessel territory infarct. No mass lesion. No extra-axial fluid collection. Underlying atrophy with chronic small vessel ischemic disease again noted. Remote bilateral cerebellar infarcts noted, right greater than left. Vascular: No new hyperdense vessel. Calcified atherosclerosis noted at the skull base. Skull: Scalp  soft tissues and calvarium demonstrate no acute abnormality. Sinuses/Orbits: Globes and orbital soft tissues within normal limits. Mild layering opacity noted within left sphenoid sinus. Paranasal sinuses are otherwise clear. No mastoid effusion. Other: None. IMPRESSION: 1. Continued interval evolution of large subacute right MCA territory infarct, stable in size and distribution as compared to previous. Increasing regional mass effect with worsened 4 mm of right-to-left midline shift. No evidence for hemorrhagic transformation. 2. Otherwise stable appearance of the brain. No new acute intracranial abnormality identified. Electronically Signed   By: Rise MuBenjamin  McClintock M.D.   On: 08/30/2017 04:34   Mr Brain Wo Contrast  Result Date: 08/30/2017 CLINICAL DATA:  84 year old female with large right MCA  infarct. MRI compatible [employer] pacemaker. EXAM: MRI HEAD WITHOUT CONTRAST MRA HEAD WITHOUT CONTRAST TECHNIQUE: Multiplanar, multiecho pulse sequences of the brain and surrounding structures were obtained without intravenous contrast. Angiographic images of the head were obtained using MRA technique without contrast. COMPARISON:  Head CT without contrast 753 hours today and earlier. Brain MRI and intracranial MRA 05/14/2013. FINDINGS: MRI HEAD FINDINGS Brain: Confluent restricted diffusion and cytotoxic edema throughout much of the right hemisphere conforming to the right MCA territory. There is scattered cortical and subcortical white matter restricted diffusion also along the medial right superior frontal gyrus corresponding to some of the ACA territory (axial diffusion image 82). Dense involvement of the right basal ganglia. Subtotal involvement of the right temporal lobe. Extension two some of the right MCA/PCA watershed territory. Involvement of the right cerebral peduncle. Petechial hemorrhage in the right basal ganglia tracking toward the right temporal stem. Also there does appear to be trace layering  hemorrhage in the right occipital horn (SWI image 26). Subtotal effacement of the right lateral ventricle. No ventriculomegaly. Leftward midline shift of 3-4 millimeters. Patent basilar cisterns. In the contralateral left hemisphere there is only a punctate possible area of restricted diffusion in the left corona radiata on diffusion axial image 69. This appears isointense on the ADC. No posterior fossa restricted diffusion. Chronic infarcts in the cerebellar hemispheres, mildly progressed on the left since 2014. Several chronic lacunar infarcts in the left lentiform, some of which were acute on the prior MRI. Associated small areas of cortical encephalomalacia in the left middle frontal gyrus. No other intracranial blood products identified. Cervicomedullary junction and pituitary are within normal limits. Vascular: Abnormal right ICA flow void in the upper neck and throughout the right siphon compatible with slow flow or occlusion (axial T2 image 6). Evidence of reconstituted flow at the right ICA terminus. See MRA findings below. Other Major intracranial vascular flow voids are stable since 2014. Mild generalized intracranial artery tortuosity. Skull and upper cervical spine: Chronic cervical disc degeneration. Normal bone marrow signal. Sinuses/Orbits: Stable and negative. Other: Mastoid air cells are clear. Visible internal auditory structures appear normal. MRA HEAD FINDINGS Antegrade flow in the posterior circulation appears stable to the 2014 MRA. There is bilateral distal vertebral artery and basilar artery irregularity in keeping with atherosclerosis. Moderate to severe left vertebral V4 segment stenosis. No significant basilar artery stenosis. SCA origins are patent. There are fetal type bilateral PCA origins, and there is faint flow signal in the right posterior communicating artery. However, there is chronically decreased flow signal in the right PCA branches compared to the left. The appearance is  stable since 2014. Mild to moderate left P2 and moderate to severe left P3 segment stenoses also appears stable. No flow signal in the cervical right ICA, right ICA siphon, terminus, right MCA, or right ACA. Flow signal in the left ICA siphon appears stable since 2014. The left ophthalmic and posterior communicating artery origins remain patent. The left ICA terminus is patent. The left MCA and ACA origins remain normal. The visible left MCA and ACA branches are stable. No anterior communicating artery flow identified, but the A-comm appear diminutive or absent on the 2014 MRA. IMPRESSION: 1. MRA indicates occlusion of the cervical right ICA, the right ICA terminus, right MCA, and right ACA. 2. Large right hemisphere infarct involving the entirety of the right MCA territory, some of the right MCA/PCA watershed territory (see #4), and also minimally involving the distal right ACA territory. 3. Confluent cytotoxic edema. Right  basal ganglia petechial hemorrhage. Intracranial mass effect with leftward midline shift of 3-4 mm is stable from the head CTs earlier today. 4. Chronic posterior circulation atherosclerosis and asymmetrically decreased right PCA flow signal. Moderate to severe distal left vertebral artery stenosis may have progressed since 2014. 5. Punctate acute to subacute white matter infarct in the left corona radiata appears unrelated to #1. No other recent ischemia in the left hemisphere or posterior fossa. 6. No distal left ICA or left anterior circulation stenosis. Electronically Signed   By: Odessa Fleming M.D.   On: 08/30/2017 12:47   Dg Chest Port 1 View  Result Date: 08/23/2017 CLINICAL DATA:  84 year old female code stroke. EXAM: PORTABLE CHEST 1 VIEW COMPARISON:  Chest radiographs 05/12/2016 and earlier. FINDINGS: Portable AP semi upright view at 1811 hours. Stable lung volumes. Stable cardiomegaly and mediastinal contours. Chronic left chest cardiac pacemaker. Allowing for portable technique the  lungs are clear. No pneumothorax or pleural effusion. No acute osseous abnormality identified. IMPRESSION: No acute cardiopulmonary abnormality. Electronically Signed   By: Odessa Fleming M.D.   On: 09/10/2017 18:27   Mr Maxine Glenn Head Wo Contrast  Result Date: 08/30/2017 CLINICAL DATA:  84 year old female with large right MCA infarct. MRI compatible [employer] pacemaker. EXAM: MRI HEAD WITHOUT CONTRAST MRA HEAD WITHOUT CONTRAST TECHNIQUE: Multiplanar, multiecho pulse sequences of the brain and surrounding structures were obtained without intravenous contrast. Angiographic images of the head were obtained using MRA technique without contrast. COMPARISON:  Head CT without contrast 753 hours today and earlier. Brain MRI and intracranial MRA 05/14/2013. FINDINGS: MRI HEAD FINDINGS Brain: Confluent restricted diffusion and cytotoxic edema throughout much of the right hemisphere conforming to the right MCA territory. There is scattered cortical and subcortical white matter restricted diffusion also along the medial right superior frontal gyrus corresponding to some of the ACA territory (axial diffusion image 82). Dense involvement of the right basal ganglia. Subtotal involvement of the right temporal lobe. Extension two some of the right MCA/PCA watershed territory. Involvement of the right cerebral peduncle. Petechial hemorrhage in the right basal ganglia tracking toward the right temporal stem. Also there does appear to be trace layering hemorrhage in the right occipital horn (SWI image 26). Subtotal effacement of the right lateral ventricle. No ventriculomegaly. Leftward midline shift of 3-4 millimeters. Patent basilar cisterns. In the contralateral left hemisphere there is only a punctate possible area of restricted diffusion in the left corona radiata on diffusion axial image 69. This appears isointense on the ADC. No posterior fossa restricted diffusion. Chronic infarcts in the cerebellar hemispheres, mildly progressed on  the left since 2014. Several chronic lacunar infarcts in the left lentiform, some of which were acute on the prior MRI. Associated small areas of cortical encephalomalacia in the left middle frontal gyrus. No other intracranial blood products identified. Cervicomedullary junction and pituitary are within normal limits. Vascular: Abnormal right ICA flow void in the upper neck and throughout the right siphon compatible with slow flow or occlusion (axial T2 image 6). Evidence of reconstituted flow at the right ICA terminus. See MRA findings below. Other Major intracranial vascular flow voids are stable since 2014. Mild generalized intracranial artery tortuosity. Skull and upper cervical spine: Chronic cervical disc degeneration. Normal bone marrow signal. Sinuses/Orbits: Stable and negative. Other: Mastoid air cells are clear. Visible internal auditory structures appear normal. MRA HEAD FINDINGS Antegrade flow in the posterior circulation appears stable to the 2014 MRA. There is bilateral distal vertebral artery and basilar artery irregularity in keeping with  atherosclerosis. Moderate to severe left vertebral V4 segment stenosis. No significant basilar artery stenosis. SCA origins are patent. There are fetal type bilateral PCA origins, and there is faint flow signal in the right posterior communicating artery. However, there is chronically decreased flow signal in the right PCA branches compared to the left. The appearance is stable since 2014. Mild to moderate left P2 and moderate to severe left P3 segment stenoses also appears stable. No flow signal in the cervical right ICA, right ICA siphon, terminus, right MCA, or right ACA. Flow signal in the left ICA siphon appears stable since 2014. The left ophthalmic and posterior communicating artery origins remain patent. The left ICA terminus is patent. The left MCA and ACA origins remain normal. The visible left MCA and ACA branches are stable. No anterior communicating  artery flow identified, but the A-comm appear diminutive or absent on the 2014 MRA. IMPRESSION: 1. MRA indicates occlusion of the cervical right ICA, the right ICA terminus, right MCA, and right ACA. 2. Large right hemisphere infarct involving the entirety of the right MCA territory, some of the right MCA/PCA watershed territory (see #4), and also minimally involving the distal right ACA territory. 3. Confluent cytotoxic edema. Right basal ganglia petechial hemorrhage. Intracranial mass effect with leftward midline shift of 3-4 mm is stable from the head CTs earlier today. 4. Chronic posterior circulation atherosclerosis and asymmetrically decreased right PCA flow signal. Moderate to severe distal left vertebral artery stenosis may have progressed since 2014. 5. Punctate acute to subacute white matter infarct in the left corona radiata appears unrelated to #1. No other recent ischemia in the left hemisphere or posterior fossa. 6. No distal left ICA or left anterior circulation stenosis. Electronically Signed   By: Odessa Fleming M.D.   On: 08/30/2017 12:47   Ct Head Code Stroke Wo Contrast  Result Date: 08/21/2017 CLINICAL DATA:  Code stroke.  Left-sided deficits EXAM: CT HEAD WITHOUT CONTRAST TECHNIQUE: Contiguous axial images were obtained from the base of the skull through the vertex without intravenous contrast. COMPARISON:  Head CT 03/03/2014 FINDINGS: Brain: No mass lesion or acute hemorrhage. There is a large amount of hypoattenuation throughout the right MCA territory with mild mass effect on the right lateral ventricle. No acute hemorrhage. There is an old left basal ganglia lacunar infarct. No hydrocephalus. Old right cerebellar infarcts are unchanged. Vascular: No hyperdense vessel. No advanced atherosclerotic calcification of the arteries at the skull base. Skull: Normal visualized skull base, calvarium and extracranial soft tissues. Sinuses/Orbits: Fluid in the left sphenoid sinus.  Normal orbits. ASPECTS  [facility] Stroke Program Early CT Score) - Ganglionic level infarction (caudate, lentiform nuclei, internal capsule, insula, M1-M3 cortex): 0 - Supraganglionic infarction (M4-M6 cortex): 0 Total score (0-10 with 10 being normal): 0 IMPRESSION: 1. Nonhemorrhagic acute to early subacute infarct of the right MCA territory. 2. ASPECTS is zero. These results were communicated to Dr. Caryl Pina at 6:07 pm on 09/05/2017 by text page via the [facility] messaging system. Electronically Signed   By: Deatra Robinson M.D.   On: 09/04/2017 18:07   Carotid Doppler Left 1-39% ICA stenosis.  Right side technically difficult due to patient holding head over to right side. High resistant /spiked waveforms noted in the Right CCA and ICA prox/ mid, suggestive of a more distal obstruction or occlusion.  TTE pending - Left ventricle: The cavity size was normal. Systolic function was   vigorous. The estimated ejection fraction was in the range of 65%   to  70%. Wall motion was normal; there were no regional wall   motion abnormalities. The study was not technically sufficient to   allow evaluation of LV diastolic dysfunction due to atrial   fibrillation. - Aortic valve: There was mild regurgitation. - Aortic root: The aortic root was normal in size. - Mitral valve: There was mild regurgitation. - Left atrium: The atrium was normal in size. - Right ventricle: Pacer wire or catheter noted in right ventricle.   Systolic function was normal. - Right atrium: Pacer wire or catheter noted in right atrium. - Tricuspid valve: There was moderate regurgitation. - Pulmonary arteries: Systolic pressure was mildly increased. PA   peak pressure: 32 mm Hg (S). - Inferior vena cava: The vessel was dilated. The respirophasic   diameter changes were blunted (< 50%), consistent with elevated   central venous pressure. - Pericardium, extracardiac: There was no pericardial effusion.\  Ct Head Wo Contrast  Result Date: 09/01/2017 CLINICAL  DATA:  84 year old female status post right ICA, MCA and ACA occlusion in cerebral infarcts. EXAM: CT HEAD WITHOUT CONTRAST TECHNIQUE: Contiguous axial images were obtained from the base of the skull through the vertex without intravenous contrast. COMPARISON:  Brain MRI and intracranial MRA, and noncontrast head CT 08/30/2017 and earlier. FINDINGS: Brain: Confluent cytotoxic edema throughout the right MCA, and marginally involving the right ACA and PCA territories as before. Stable petechial hemorrhage at the right basal ganglia. Intracranial mass effect has increased. Midline shift to the left is increased from 3-4 mm now to 8-9 mm (coronal image 31). Effacement of the right lateral ventricle has progressed. Left lateral ventricle size remains stable. Basilar cisterns are stable. No new cerebral edema or new intracranial hemorrhage identified. Small chronic infarcts in the right cerebellum and left MCA territory. Vascular: Calcified atherosclerosis at the skull base. Skull: Stable.  No acute osseous abnormality identified. Sinuses/Orbits: Stable.  Bubbly opacity in the left sphenoid sinus. Other: Stable orbit and scalp soft tissues. IMPRESSION: 1. Stable extent of the large right hemisphere infarct, and stable petechial hemorrhage. But progressed intracranial mass effect since 08/30/2017. Leftward midline shift is increased to 8-9 mm. Lateral ventricle effacement has increased, but there is no ventriculomegaly. 2. Stable basilar cistern patency. 3. No new intracranial abnormality. Electronically Signed: By: Odessa Fleming M.D. On: 09/01/2017 07:08    PHYSICAL EXAM  Temp:  [98.7 F (37.1 C)-101.1 F (38.4 C)] 98.7 F (37.1 C) (04/13 0744) Pulse Rate:  [44-121] 103 (04/13 0700) Resp:  [13-38] 27 (04/13 0700) BP: (107-167)/(54-115) 115/84 (04/13 0700) SpO2:  [92 %-100 %] 100 % (04/13 0700)  General - Well nourished, well developed, lethargic.  Ophthalmologic - fundi not visualized due to small  pupils.  Cardiovascular - irregularly irregular heart rate and rhythm.  Neuro - lethargic, eyes closed, mumbling sounds, not able to answer orientation questions. Follows simple central and peripheral commands on the right. Eyes deviated to right, not crossing midline, not blinking to visual threat bilaterally. Bilateral small pupils, sluggish to light. Left facial droop, tongue midline in mouth. Left UE and LE flaccid. RUE follows simple commands, 3-/5 strength and RLE withdraw to pain. DTR diminished and no babinski. Sensation, coordination and gait not tested.   ASSESSMENT/PLAN Ms. YULIETH CARRENDER is a 84 y.o. female with history of PAF not on AC, SSS s/p PPM, hx of stroke, HTN admitted for found down at home, left sided weakness, left facial droop and right gaze preference. No tPA given due to out side window.  Stroke:  right total MCA, punctate right ACA and left MCA infarcts embolic secondary to Afib not on AC  Resultant right gaze, left hemiplegia, left facial droop  MRI  Right large MCA, puncate right ACA and left MCA infarcts  MRA  Right ICA, MCA, ACA occluded. Severe stenosis left VA  Carotid Doppler right ICA and CCA resistant waveform, suggesting distal occlusion  2D Echo EF 65-70%  LDL 59  HgbA1c 5.3  Heparin subq for VTE prophylaxis Diet NPO time specified  Fall precautions   clopidogrel 75 mg daily prior to admission, now on aspirin 300 mg suppository daily.   Disposition:  palliative care discussed with daughter and will initiate comfort care measures  Cerebral edema  Stable midline shift on MRI  Repeat CT 09/01/17 in am showed increased midline shift  Off 3% saline, now on NS  Na 144->149->156->161->155->156  PAF not on AC  PAF detected on PPM  Follows with Dr. Rubie Maid  Fall risk and pt has declined AC  On plavix at home  Rate under control with IV metoprolol  Hx of stroke  04/2013 - numbness, AMS, slurry speech. MRI left MCA infarct,  old right CR infarct and old right cerebellar infarcts. On plavix (no ASA due to primary biliary cirrhosis). TEE no PFO, unremarkable. MRA intracranial athero. CUS and TTE unremarkable. EKG no afib.  Fever  Tmax 101.1  Likely due to aspiration   Hypertension Stable  Hyperlipidemia  Home meds:  lipitor 40   LDL 59, goal < 70  Dysphagia   Did not pass swallow  Comfort feeds  Pt refused NG tube   Other Stroke Risk Factors  Advanced age  Obesity, Body mass index is 31.84 kg/m.   SSS on PPM  Other Active Problems  Cognitive impairment recently  Hospital day # 4  Marvel Plan, MD PhD Stroke Neurology 09/02/2017 2:36 PM      To contact Stroke Continuity provider, please refer to [email or web address]. After hours, contact General Neurology

## 2017-09-02 NOTE — Progress Notes (Signed)
Received from ICU. Appears comfortable, family members at bedside. Follows simple basic commands. Family oriented to floor set up.

## 2017-09-02 NOTE — Consult Note (Signed)
Consultation Note Date: 09/02/2017   Patient Name: Diamond Miller  DOB: 1932/10/09  MRN: [id number]  Age / Sex: 84 y.o., female  PCP: Vernie Shanks, MD Referring Physician: Rosalin Hawking, MD  Reason for Consultation: Establishing goals of care and Psychosocial/spiritual support  HPI/Patient Profile: 84 y.o. female  with past medical history of cirrhosis, GERD, HTN, HLD, a-fib with RVR admitted on 09/01/2017 with large right MCA CVA. Additionally her right ICA occluded.Pt had a clnical change last night as evidenced by tachypnea, now on 100% NRB; HR 150's a-fib with RVR, Repeat CT shows worsening midline shift from 3-4 mm to 8-9. SLP has seen pt and evaluation reveal severe dysphagia with silent penetration of all consistencies. After changes last night, family has now elcted DNR  Consult ordered for [facility]..   Clinical Assessment and Goals of Care: Met with pt, dtr, grandson and DIL, and chart reviewed. Staffed with Dr. Erlinda Hong and clinical condition reviewed.  Patient's daughter is coming to terms with her mother's grim prognosis.  She is able to share with me that unless her mother could return to her home with some level of independence she would not find quality of life on extensive medical equipment, or in a skilled nursing facility.  Per Joseph Art, patient's sister, is also in agreement with this plan.  Also grandson verbalized that in knowing his grandmother she would not feel that living with a stroke of the severity would be quality of life  Pt unable to speak for herself. Her dtr Joseph Art [phone] is her health care proxy  Introduce the topic of residential hospice for end-of-life care and family is in agreement with this.  Also discussed transfer out of ICU to allow for more flexible family visitation as well as the ability to stay with patient overnight.  They were in agreement with this as well    SUMMARY OF  RECOMMENDATIONS   Confirmed DNR/DNI No artificial feeding Do not advance O2 to BiPAP.  May continue 100% nonrebreather if patient is tolerating We will leave comfort medication orders which may help facilitate patient coming off of 100% nonrebreather breather Consult placed to social work for transfer to residential hospice.  Offered choice, per Medicare guidelines.  Family agreeable to either [facility] or [facility] Code Status/Advance Care Planning:  DNR    Symptom Management:   Dyspnea: Continue with 100% nonrebreather as tolerated.  We will also leave orders for morphine 1-2 mg every 30 minutesas needed for shortness of breath.  Monitor for need for morphine continuous infusion or scheduled dosing  Pain: Patient has a history of back pain.  Monitor for nonverbal signs and symptoms of pain such as grimacing, morphine as needed available  Secretions: Given patient's severity of dysphasia, she is at high risk for developing upper airway secretions, Robinul as needed dosing ordered.  Monitor for need for scheduled dosing  Agitation: Leave as needed dosing for Ativan.  Monitor for need for scheduled dosing or continuous infusion  Palliative Prophylaxis:  Aspiration, Bowel Regimen, Delirium Protocol, Eye Care, Frequent Pain Assessment, Oral Care and Turn Reposition  Additional Recommendations (Limitations, Scope, Preferences):  Full Comfort Care and Initiate Comfort Feeding  Psycho-social/Spiritual:   Desire for further Chaplaincy support:no  Additional Recommendations: Referral to Community Resources   Prognosis:   < 2 weeks in the setting of large right MCA stroke with worsening edema, worsening midline shift now 8-9 mm; severe dysphasia on all consistencies with silent aspiration.  She is at high risk for acute decompensation  Discharge Planning: Hospice facility      Primary Diagnoses: Present on Admission: . Acute right arterial ischemic stroke,  middle cerebral artery (MCA) ([place])   I have reviewed the medical record, interviewed the patient and family, and examined the patient. The following aspects are pertinent.  Past Medical History:  Diagnosis Date  . Acid reflux   . Anemia 1957  . Anxiety   . Chronic back pain   . COPD (chronic obstructive pulmonary disease) ([place])   . Degenerative disc disease, cervical   . Dysrhythmia    "skips" (06/12/2013)  . Exertional shortness of breath    "recently" (06/12/2013)  . High cholesterol   . History of blood transfusion    "think it was when I had knee OR" (06/12/2013)  . Hypertension   . Liver cirrhosis ([place])   . Pacemaker, Medtronic placed 06/01/13 06/11/2013  . Scoliosis   . Stroke ([place]) 05/14/2013   denies residual on 06/12/2013   Social History   Socioeconomic History  . Marital status: Widowed    Spouse name: Not on file  . Number of children: Not on file  . Years of education: Not on file  . Highest education level: Not on file  Occupational History  . Not on file  Social Needs  . Financial resource strain: Not on file  . Food insecurity:    Worry: Not on file    Inability: Not on file  . Transportation needs:    Medical: Not on file    Non-medical: Not on file  Tobacco Use  . Smoking status: Never Smoker  . Smokeless tobacco: Never Used  Substance and Sexual Activity  . Alcohol use: No  . Drug use: No  . Sexual activity: Never  Lifestyle  . Physical activity:    Days per week: Not on file    Minutes per session: Not on file  . Stress: Not on file  Relationships  . Social connections:    Talks on phone: Not on file    Gets together: Not on file    Attends religious service: Not on file    Active member of club or organization: Not on file    Attends meetings of clubs or organizations: Not on file    Relationship status: Not on file  Other Topics Concern  . Not on file  Social History Narrative  . Not on file   Family History  Problem Relation Age  of Onset  . Heart disease Mother   . Hypertension Sister   . Dementia Brother 60   Scheduled Meds: . aspirin  300 mg Rectal Daily  . chlorhexidine  15 mL Mouth Rinse BID  . heparin  5,000 Units Subcutaneous Q8H  . mouth rinse  15 mL Mouth Rinse q12n4p  . metoprolol tartrate  5 mg Intravenous Q6H   Continuous Infusions: . sodium chloride 50 mL/hr at 09/02/17 0800   PRN Meds:.acetaminophen, hydrALAZINE, metoprolol tartrate, senna-docusate Medications Prior to Admission:  Prior to  Admission medications   Medication Sig Start Date End Date Taking? Authorizing Provider  Ascorbic Acid (VITAMIN C) 1000 MG tablet Take 1,000 mg by mouth daily. Reported on 05/11/2015   Yes [provider]  Aspirin-Salicylamide-Caffeine (BC HEADACHE POWDER PO) Take 1 packet by mouth as needed (for headaches).   Yes [provider]  atorvastatin (LIPITOR) 40 MG tablet TAKE 1 TABLET BY MOUTH DAILY AT 6 PM. Patient taking differently: Take 40 mg by mouth in the evening at 6 PM 07/17/17  Yes Croitoru, Mihai, MD  cetirizine (ZYRTEC) 10 MG tablet TAKE 1 TABLET BY MOUTH DAILY. Patient taking differently: Take 10 mg by mouth once a day 04/27/16  Yes Birdie Sons, MD  clopidogrel (PLAVIX) 75 MG tablet TAKE 1 TABLET BY MOUTH DAILY. Patient taking differently: Take 75 mg by mouth once a day 03/08/17  Yes Croitoru, Mihai, MD  diazepam (VALIUM) 5 MG tablet Take 0.5 tablets (2.5 mg total) by mouth every 12 (twelve) hours as needed for anxiety. Patient taking differently: Take 2.5 mg by mouth 2 (two) times daily.  10/23/15  Yes Margarita Rana, MD  furosemide (LASIX) 40 MG tablet TAKE 1 TABLET BY MOUTH DAILY AS NEEDED. Patient taking differently: Take 40 mg by mouth once a day 03/08/17  Yes Croitoru, Mihai, MD  Ginkgo Biloba (GINKOBA PO) Take 1 tablet by mouth daily.   Yes [provider]  metoprolol succinate (TOPROL-XL) 50 MG 24 hr tablet TAKE 1 AND 1/2 TABLETS BY MOUTH ONCE DAILY WITH  FOOD. Patient taking differently: Take 75 mg by mouth daily.  05/31/17  Yes Croitoru, Mihai, MD  Multiple Vitamins-Minerals (ONE-A-DAY WOMENS 50 PLUS PO) Take 1 tablet by mouth daily.   Yes [provider]  omeprazole (PRILOSEC) 40 MG capsule Take 1 capsule (40 mg total) by mouth daily. 09/28/15  Yes Margarita Rana, MD  oxyCODONE (OXY IR/ROXICODONE) 5 MG immediate release tablet Take 5 mg by mouth every 6 (six) hours as needed (for pain).   Yes [provider]  polyethylene glycol powder (GLYCOLAX/MIRALAX) powder Take 17 g by mouth daily as needed for mild constipation.   Yes [provider]  traZODone (DESYREL) 150 MG tablet TAKE 1/2 TABLET BY MOUTH DAILY. Patient taking differently: Take 75 mg by mouth in the evening 08/10/15  Yes Margarita Rana, MD  ketoconazole (NIZORAL) 2 % cream Apply 1 application topically daily. Patient not taking: Reported on 09/07/2017 01/16/16   Wardell Honour, MD  sodium chloride (OCEAN) 0.65 % SOLN nasal spray Place 1 spray into both nostrils as needed for congestion. Patient not taking: Reported on 09/11/2017 05/03/16   Jeannett Senior, PA-C   Allergies  Allergen Reactions  . Aspirin Other (See Comments)    Liver function; cirrhosis  . Tylenol [Acetaminophen] Other (See Comments)    Liver function; cirrhosis   . Other Other (See Comments)    No red meat because of cirrhosis and heart  . Penicillins Rash    Has patient had a PCN reaction causing immediate rash, facial/tongue/throat swelling, SOB or lightheadedness with hypotension: Yes Has patient had a PCN reaction causing severe rash involving mucus membranes or skin necrosis: Unk Has patient had a PCN reaction that required hospitalization: Unk Has patient had a PCN reaction occurring within the last 10 years: No If all of the above answers are "NO", then may proceed with Cephalosporin use.    Review of Systems  Unable to perform ROS: Acuity of condition    Physical Exam  Constitutional: She appears well-developed and well-nourished.  Acutely ill appearing elderly female Minimally responsive  HENT:  Head: Normocephalic and atraumatic.  Cardiovascular:  Tachycardic HR 107  Pulmonary/Chest:  Wearing 100% NRB RR 24  Neurological:  Minimally responsive. Able to follow simple commands: thumbs up, wiggle your toes ( left) Speech dysarthric Keeps her eyes closed  Skin: Skin is warm and dry. There is pallor.  Psychiatric:  Unable to test. No overt agitation presently  Nursing note and vitals reviewed.   Vital Signs: BP 127/80   Pulse (!) 103   Temp 98.7 F (37.1 C) (Axillary)   Resp (!) 27   Ht 4' 11"  (1.499 m)   Wt 71.5 kg (157 lb 10.1 oz)   SpO2 100%   BMI 31.84 kg/m  Pain Scale: 0-10 POSS *See Group Information*: S-Acceptable,Sleep, easy to arouse Pain Score: Asleep   SpO2: SpO2: 100 % O2 Device:SpO2: 100 % O2 Flow Rate: .   IO: Intake/output summary:   Intake/Output Summary (Last 24 hours) at 09/02/2017 0849 Last data filed at 09/02/2017 0800 Gross per 24 hour  Intake 1200 ml  Output 625 ml  Net 575 ml    LBM: Last BM Date: 09/01/17 Baseline Weight: Weight: 71.5 kg (157 lb 10.1 oz) Most recent weight: Weight: 71.5 kg (157 lb 10.1 oz)     Palliative Assessment/Data:   Flowsheet Rows     Most Recent Value  Intake Tab  Referral Department  Neurology  Unit at Time of Referral  ICU  Palliative Care Primary Diagnosis  Neurology  Date Notified  09/01/17  Palliative Care Type  New Palliative care  Reason for referral  Clarify Goals of Care, Psychosocial or Spiritual support, Counsel Regarding Hospice  Date of Admission  08/27/17  Date first seen by Palliative Care  09/02/17  # of days Palliative referral response time  1 Day(s)  # of days IP prior to Palliative referral  5  Clinical Assessment  Palliative Performance Scale Score  30%  Pain Max last 24 hours  Not able to report  Pain Min Last 24 hours  Not able to report   Dyspnea Max Last 24 Hours  Not able to report  Dyspnea Min Last 24 hours  Not able to report  Nausea Max Last 24 Hours  Not able to report  Nausea Min Last 24 Hours  Not able to report  Anxiety Max Last 24 Hours  Not able to report  Anxiety Min Last 24 Hours  Not able to report  Other Max Last 24 Hours  Not able to report  Psychosocial & Spiritual Assessment  Palliative Care Outcomes  Patient/Family meeting held?  Yes  Who was at the meeting?  dtr Renee  Patient/Family wishes: Interventions discontinued/not started   Mechanical Ventilation, PEG, Trach  Palliative Care follow-up planned  Yes, Facility      Time In: 0845 Time Out: 1000 Time Total: 75 min Greater than 50%  of this time was spent counseling and coordinating care related to the above assessment and plan.Staffed with Dr. Erlinda Hong  Signed by: Dory Horn, NP   Please contact Palliative Medicine Team phone at [phone] for questions and concerns.  For individual provider: See Shea Evans

## 2017-09-20 NOTE — Social Work (Addendum)
CSW aware that pt is able to [facility] today. There is question as to her medical stability for transport. [facility], bedside RN working on having PMT to visit pt and this Clinical research associatewriter has also requested neurology assess for stability to transfer.  9:00am- CSW received call that pt has passed. CSW signing off. Please consult if any additional needs arise.  Doy HutchingIsabel H Jontay Maston, LCSWA [facility] Clinical Social Work [phone]

## 2017-09-20 NOTE — Discharge Summary (Addendum)
Stroke Discharge Summary  Patient ID: Diamond Miller   MRN: [id number]      DOB: June 28, 1932  Date of Admission: 09/20/17 Date of Discharge: 08/28/2017  Attending Physician:  No att. providers found, Stroke MD Consultant(s):    Palliative Care and Critical Care Medicine Patient's PCP:  Diamond Ladd, MD  DISCHARGE DIAGNOSIS:  Large, confluent right MCA territory infarct with cytotoxic edema. Active Problems:   Acute ischemic large right MCA stroke (HCC)   Cerebral edema   Brain herniation   Persistent atrial fibrillation (HCC)   Oropharyngeal dysphagia   Aspiration pneumonia   Past Medical History:  Diagnosis Date  . Acid reflux   . Anemia 1957  . Anxiety   . Chronic back pain   . COPD (chronic obstructive pulmonary disease) (H[facility])   . Degenerative disc disease, cervical   . Dysrhythmia    "skips" (06/12/2013)  . Exertional shortness of breath    "recently" (06/12/2013)  . High cholesterol   . History of blood transfusion    "think it was when I had knee OR" (06/12/2013)  . Hypertension   . Liver cirrhosis (H[facility])   . Pacemaker, Medtronic placed 06/01/13 06/11/2013  . Scoliosis   . Stroke (H[facility]) 05/14/2013   denies residual on 06/12/2013   Past Surgical History:  Procedure Laterality Date  . CATARACT EXTRACTION W/ INTRAOCULAR LENS  IMPLANT, BILATERAL Bilateral 2006-2007  . INSERT / REPLACE / REMOVE PACEMAKER  06/11/2013  . JOINT REPLACEMENT    . PACEMAKER INSERTION  06/01/13   Dr. Royann Shivers Medtronic device  . PERMANENT PACEMAKER INSERTION N/A 06/11/2013   Procedure: PERMANENT PACEMAKER INSERTION;  Surgeon: Thurmon Fair, MD;  Location: [facility] CATH LAB;  Service: Cardiovascular;  Laterality: N/A;  . REPLACEMENT TOTAL KNEE Right 2010  . TEE WITHOUT CARDIOVERSION N/A 05/17/2013   Procedure: TRANSESOPHAGEAL ECHOCARDIOGRAM (TEE);  Surgeon: Vesta Mixer, MD;  Location[facility] ENDOSCOPY;  Service: Cardiovascular;  Laterality: N/A;  . TONSILLECTOMY  ~ 1961      LABORATORY  STUDIES CBC    Component Value Date/Time   WBC 12.8 (H) 09/02/2017 0512   RBC 4.07 09/02/2017 0512   HGB 12.3 09/02/2017 0512   HCT 38.5 09/02/2017 0512   PLT 175 09/02/2017 0512   MCV 94.6 09/02/2017 0512   MCV 88.1 12/30/2015 1449   MCH 30.2 09/02/2017 0512   MCHC 31.9 09/02/2017 0512   RDW 14.5 09/02/2017 0512   LYMPHSABS 1.4 09/20/17 1759   MONOABS 0.9 09-20-2017 1759   EOSABS 0.0 09/20/17 1759   BASOSABS 0.0 Sep 20, 2017 1759   CMP    Component Value Date/Time   NA 156 (H) 09/02/2017 0512   K 3.4 (L) 09/02/2017 0512   CL 124 (H) 09/02/2017 0512   CO2 26 09/02/2017 0512   GLUCOSE 101 (H) 09/02/2017 0512   BUN 30 (H) 09/02/2017 0512   CREATININE 1.07 (H) 09/02/2017 0512   CREATININE 0.90 (H) 12/30/2015 1512   CALCIUM 9.0 09/02/2017 0512   PROT 6.7 08/31/2017 2228   ALBUMIN 2.8 (L) 08/31/2017 2228   AST 38 08/31/2017 2228   ALT 20 08/31/2017 2228   ALKPHOS 133 (H) 08/31/2017 2228   BILITOT 1.2 08/31/2017 2228   GFRNONAA 47 (L) 09/02/2017 0512   GFRAA 54 (L) 09/02/2017 0512   COAGS Lab Results  Component Value Date   INR 1.06 09/20/2017   INR 1.04 05/03/2016   INR 1.1 (H) 04/03/2015   Lipid Panel    Component Value Date/Time  CHOL 108 08/30/2017 0411   TRIG 69 08/30/2017 0411   HDL 35 (L) 08/30/2017 0411   CHOLHDL 3.1 08/30/2017 0411   VLDL 14 08/30/2017 0411   LDLCALC 59 08/30/2017 0411   HgbA1C  Lab Results  Component Value Date   HGBA1C 5.3 08/30/2017   Urinalysis    Component Value Date/Time   COLORURINE YELLOW 09/01/2017 0615   APPEARANCEUR CLEAR 09/01/2017 0615   LABSPEC 1.012 09/01/2017 0615   PHURINE 6.0 09/01/2017 0615   GLUCOSEU NEGATIVE 09/01/2017 0615   HGBUR MODERATE (A) 09/01/2017 0615   BILIRUBINUR NEGATIVE 09/01/2017 0615   KETONESUR 5 (A) 09/01/2017 0615   PROTEINUR NEGATIVE 09/01/2017 0615   UROBILINOGEN 0.2 05/14/2013 0846   NITRITE NEGATIVE 09/01/2017 0615   LEUKOCYTESUR NEGATIVE 09/01/2017 0615   Urine Drug Screen  No results found for: LABOPIA, COCAINSCRNUR, LABBENZ, AMPHETMU, THCU, LABBARB  Alcohol Level No results found for: ETH   SIGNIFICANT DIAGNOSTIC STUDIES  Ct Head Wo Contrast 09/01/2017 IMPRESSION:  1. Stable extent of the large right hemisphere infarct, and stable petechial hemorrhage. But progressed intracranial mass effect since 08/30/2017. Leftward midline shift is increased to 8-9 mm. Lateral ventricle effacement has increased, but there is no ventriculomegaly.  2. Stable basilar cistern patency.  3. No new intracranial abnormality.   Ct Head Wo Contrast 08/30/2017 IMPRESSION:  1. Stable non contrast CT appearance of the brain since 0354 hours today. Large, confluent right MCA territory infarct with cytotoxic edema and trace petechial hemorrhage.  2. Effaced right lateral ventricle, but only 3 mm of leftward midline shift at this time, no ventriculomegaly, and basilar cisterns remain patent.  3. No new intracranial abnormality.   Ct Head Wo Contrast 08/30/2017 IMPRESSION:  1. Continued interval evolution of large subacute right MCA territory infarct, stable in size and distribution as compared to previous. Increasing regional mass effect with worsened 4 mm of right-to-left midline shift. No evidence for hemorrhagic transformation.  2. Otherwise stable appearance of the brain. No new acute intracranial abnormality identified.   Mr Maxine GlennMra Head Wo Contrast 08/30/2017 IMPRESSION:  1. MRA indicates occlusion of the cervical right ICA, the right ICA terminus, right MCA, and right ACA.  2. Large right hemisphere infarct involving the entirety of the right MCA territory, some of the right MCA/PCA watershed territory (see #4), and also minimally involving the distal right ACA territory.  3. Confluent cytotoxic edema. Right basal ganglia petechial hemorrhage. Intracranial mass effect with leftward midline shift of 3-4 mm is stable from the head CTs earlier today.  4. Chronic posterior circulation  atherosclerosis and asymmetrically decreased right PCA flow signal. Moderate to severe distal left vertebral artery stenosis may have progressed since 2014.  5. Punctate acute to subacute white matter infarct in the left corona radiata appears unrelated to #1. No other recent ischemia in the left hemisphere or posterior fossa.  6. No distal left ICA or left anterior circulation stenosis.    Dg Chest Port 1 View 08/31/2017 IMPRESSION:  Minimal right basilar atelectasis.   Dg Chest Port 1 View 08/30/2017 IMPRESSION:  1. No evidence of a pneumothorax following central line placement attempt.  2. No acute cardiopulmonary disease   Dg Chest Port 1 View 09/19/2017 IMPRESSION:  No acute cardiopulmonary abnormality.   Ct Head Code Stroke Wo Contrast 09/17/2017 IMPRESSION:  1. Nonhemorrhagic acute to early subacute infarct of the right MCA territory.  2. ASPECTS is zero.   [place] Ekg Site Rite 08/30/2017 [facility] not [facility], placement could not  be confirmed due to current cardiac rhythm.     HISTORY OF PRESENT ILLNESS Diamond Miller is an 84 y.o. female who presented to the ED via EMS after a neighbor found her laying in her bed immobile. LKN was the day prior to admission at 6:00 PM. Per EMS she had left sided weakness, left facial droop and right gaze deviation. The patient stated that she was not on anticoagulation, but was taking Plavix. She has a history of prior stroke with residual right sided weakness.   CT head in the ED showed a large completed right MCA ischemic infarction.   LSN: 6:00 PM 08/28/2017 tPA Given: No: Out of time window.     HOSPITAL COURSE [name] was an 84 y.o. female with a history of PAF not on AC, SSS s/p PPM, hx of stroke, HTN admitted after she was found down at home, left sided weakness, left facial droop and right gaze preference. No tPA given due to out side window.    Stroke:  right total MCA, punctate right ACA and left MCA  infarcts embolic secondary to Afib not on AC  Resultant right gaze, left hemiplegia, left facial droop  MRI  Right large MCA, puncate right ACA and left MCA infarcts  MRA  Right ICA, MCA, ACA occluded. Severe stenosis left VA  Carotid Doppler right ICA and CCA resistant waveform, suggesting distal occlusion  2D Echo EF 65-70%  LDL 59  HgbA1c 5.3  Heparin subq for VTE prophylaxis  Diet NPO time specified  Fall precautions   clopidogrel 75 mg daily prior to admission, put on aspirin 300 mg suppository daily. Then ASA d/c once comfort care initiated.  Disposition:  palliative care discussed with daughter and comfort care measures were initiated on Oct 01, 2017. Pt deceased on Oct 02, 2017 0855.   Cerebral edema  Stable midline shift on MRI  Repeat CT 09/01/17 in am showed increased midline shift  Off 3% saline, now on NS  Na 144->149->156->161->155->156  PAF not on AC  PAF detected on PPM  Follows with Dr. Rubie Maid  Fall risk and pt has declined AC  On plavix at home  Rate under control with IV metoprolol  Hx of stroke  04/2013 - numbness, AMS, slurry speech. MRI left MCA infarct, old right CR infarct and old right cerebellar infarcts. On plavix (no ASA due to primary biliary cirrhosis). TEE no PFO, unremarkable. MRA intracranial athero. CUS and TTE unremarkable. EKG no afib.  Fever  Tmax 101.1  Likely due to aspiration   Hypertension  Stable  Hyperlipidemia  Home meds:  lipitor 40   LDL 59, goal < 70  Dysphagia   Did not pass swallow  Comfort feeds  Pt and family refused NG tube   Other Stroke Risk Factors  Advanced age  Obesity, Body mass index is 31.84 kg/m.   SSS on PPM  Other Active Problems  Recent cognitive impairment    DISCHARGE EXAM Pt deceased.    DISCHARGE PLAN  Disposition:  Deceased  Marvel Plan, MD PhD Stroke Neurology 10/02/2017 4:49 PM

## 2017-09-20 NOTE — Plan of Care (Signed)
Came to see pt with pt RN and pt was pronounced dead at 0855. Death certificate signed and gave to 6N front desk.   Diamond [name], MD PhD Stroke Neurology 09/05/2017 9:10 AM

## 2017-09-20 NOTE — Progress Notes (Signed)
[facility] Liaison:  RN visit  Patient has had a decline overnight and there is some question as to whether she will be able to transport.  Notified bedside RN and CSW to advise.  They will have medical team assess and advise if patient is still able to go [facility].   Thank you for the referral.   Adele BarthelAmy Evans, RN, BSN [facility] Hospital Liaison [phone]  All hospital liaisons are now on AMION.

## 2017-09-20 DEATH — deceased

## 2018-06-04 IMAGING — CT CT HEAD W/O CM
3 of 4 series · 14 of 47 positions shown, 16 images · non-contrast
Comparison: Brain MRI and intracranial MRA, and noncontrast head CT
08/30/2017 and earlier.

ADDENDUM:
Study discussed by telephone with Dr. GALOCKA HUBER on 09/01/2017 at
4339 hours.
CLINICAL DATA: 84-year-old female status post right ICA, MCA and
ACA occlusion in cerebral infarcts.

EXAM:
CT HEAD WITHOUT CONTRAST
TECHNIQUE: Contiguous axial images were obtained from the base of the skull
through the vertex without intravenous contrast.

[Series 4: head 2.0 h70h · axial · 0.40mm/px · z∈[-112,+18]mm · 8 of 83 slices shown, 10 images]
[im 9/83  brain]
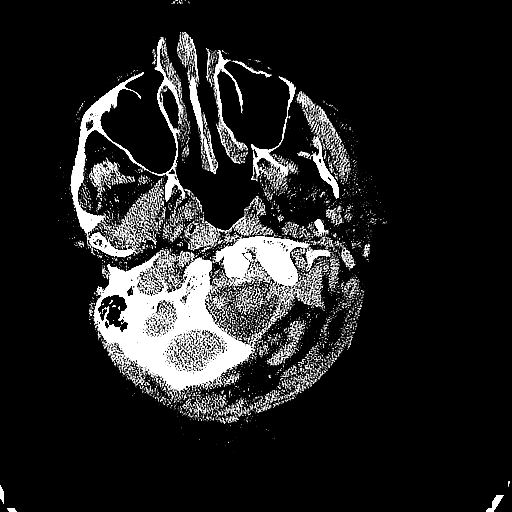
[im 9/83  bone]
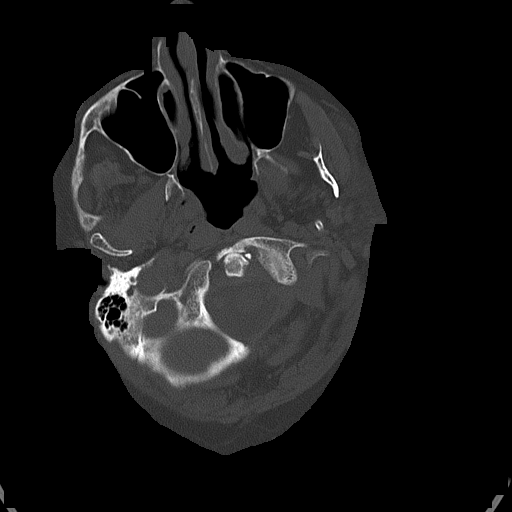
[im 17/83  brain]
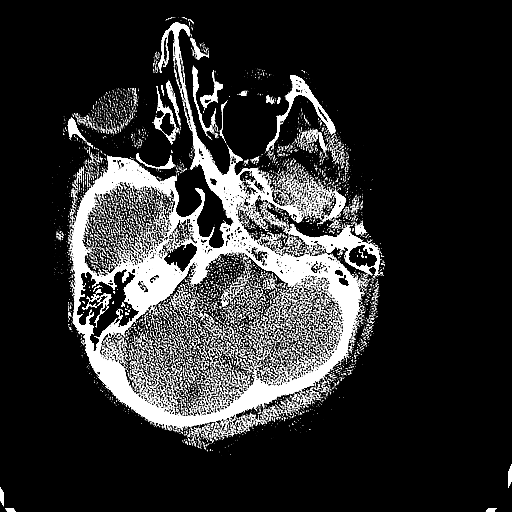
[im 25/83  brain]
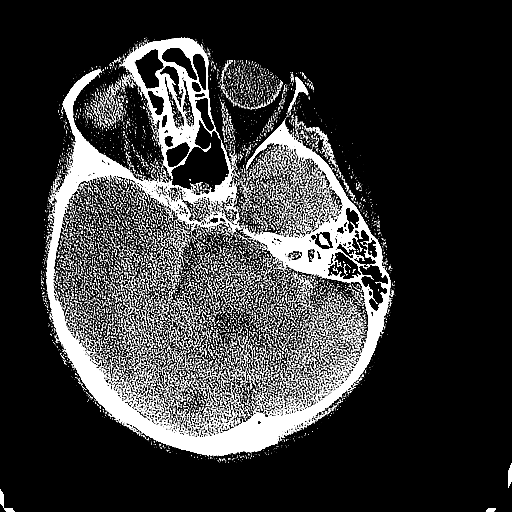
[im 37/83  brain]
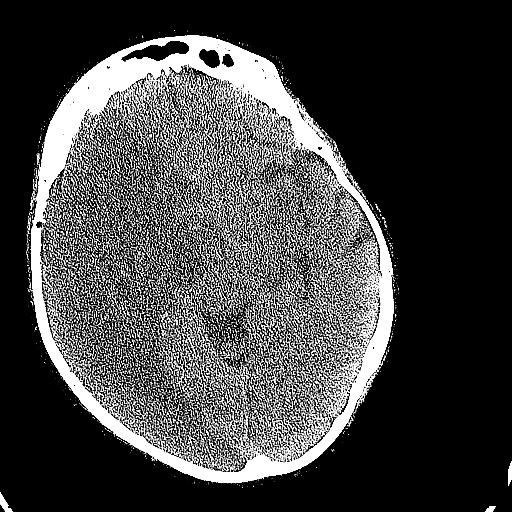
[im 46/83  brain]
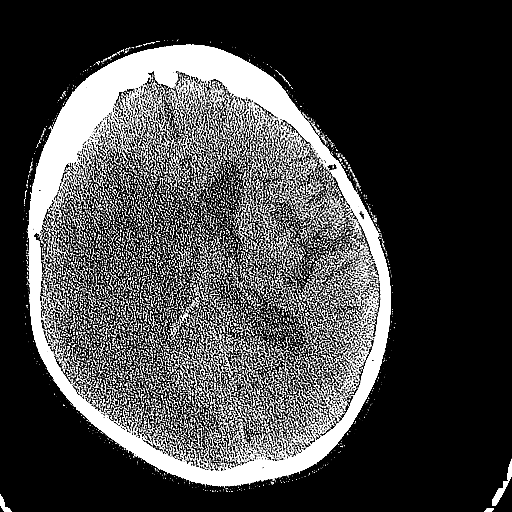
[im 46/83  bone]
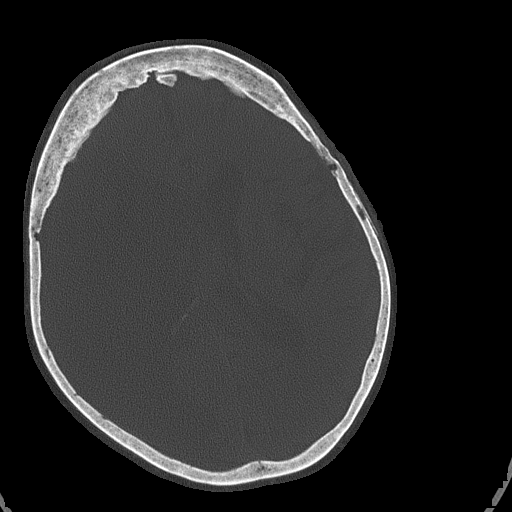
[im 58/83  brain]
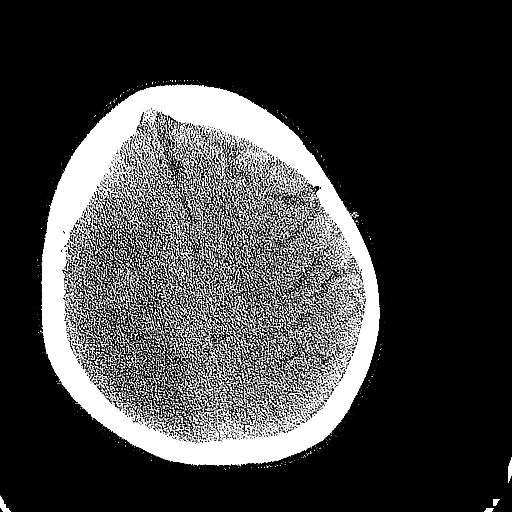
[im 66/83  brain]
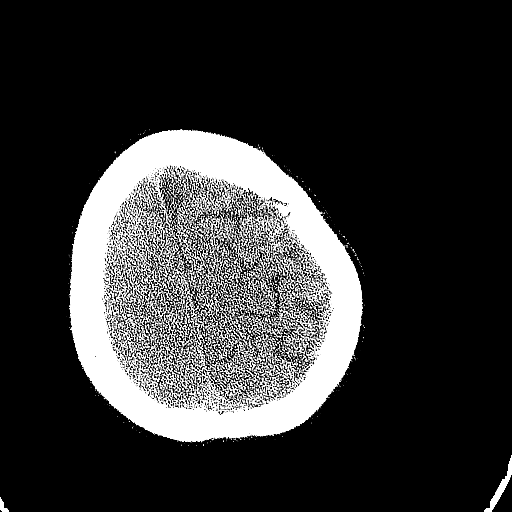
[im 74/83  brain]
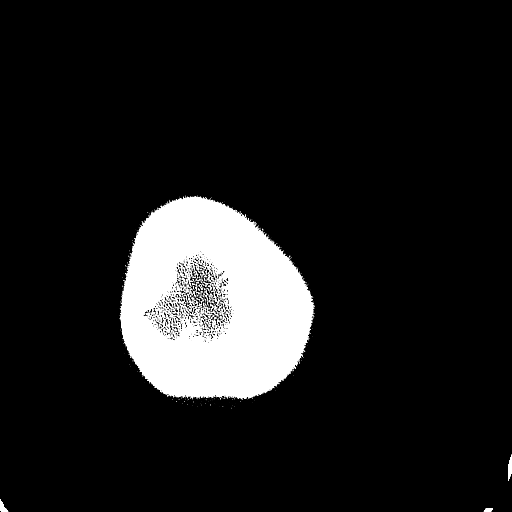

[Series 5: head 3.0 mpr cor · coronal · 0.32mm/px · 3 of 67 slices shown]
[im 23/67  brain]
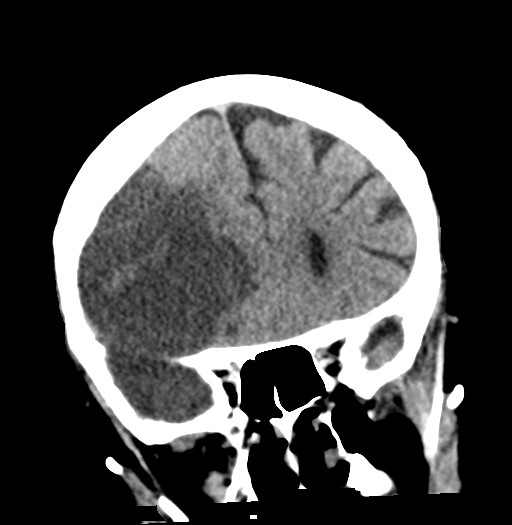
[im 30/67  brain]
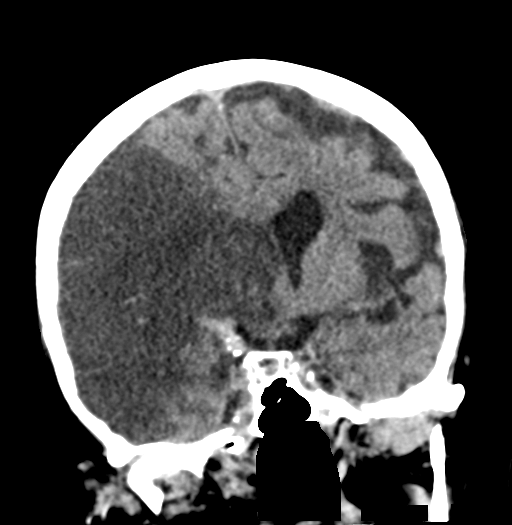
[im 37/67  brain]
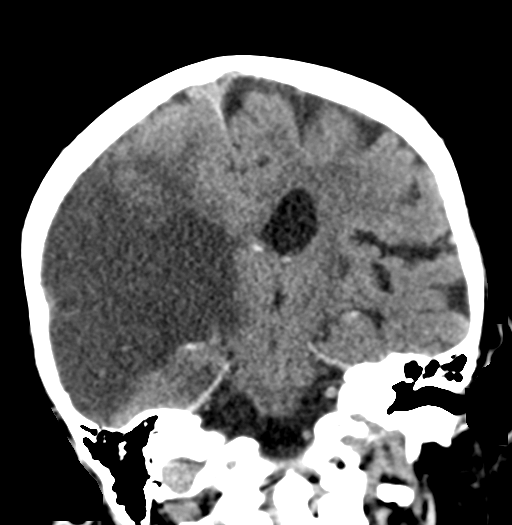

[Series 6: head 3.0 mpr sag · sagittal · 0.33mm/px · 3 of 65 slices shown]
[im 22/65  brain]
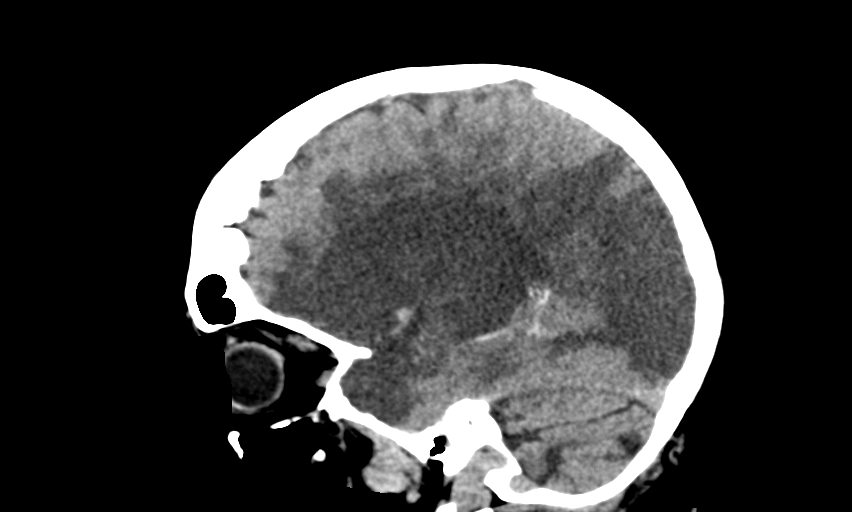
[im 33/65  brain]
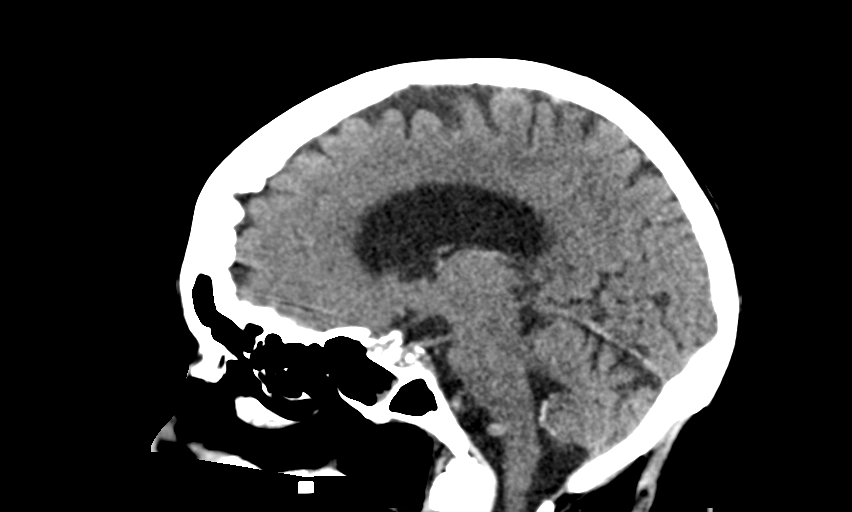
[im 43/65  brain]
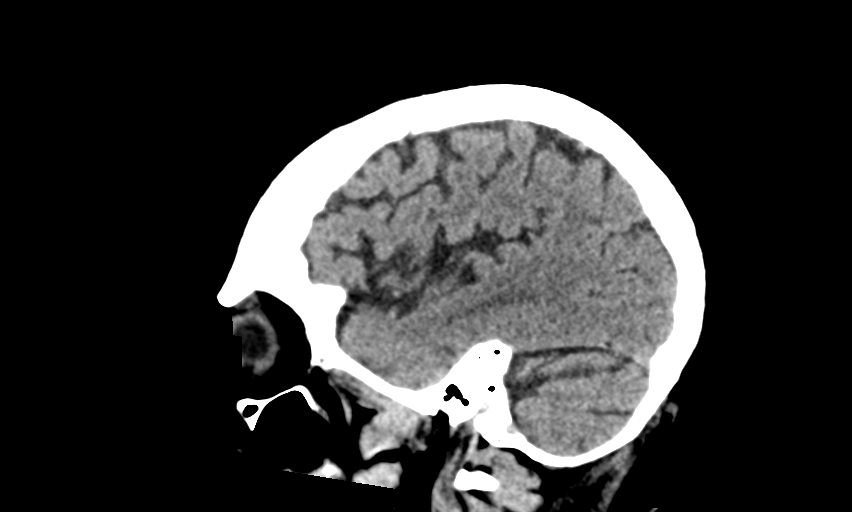

[14 of 47 positions shown; findings below may reference images not displayed]

FINDINGS: Brain: Confluent cytotoxic edema throughout the right MCA, and
marginally involving the right ACA and PCA territories as before.
Stable petechial hemorrhage at the right basal ganglia.

Intracranial mass effect has increased. Midline shift to the left is
increased from 3-4 mm now to 8-9 mm (coronal image 31). Effacement
of the right lateral ventricle has progressed. Left lateral
ventricle size remains stable. Basilar cisterns are stable.

No new cerebral edema or new intracranial hemorrhage identified.
Small chronic infarcts in the right cerebellum and left MCA
territory.

Vascular: Calcified atherosclerosis at the skull base.

Skull: Stable.  No acute osseous abnormality identified.

Sinuses/Orbits: Stable.  Bubbly opacity in the left sphenoid sinus.

Other: Stable orbit and scalp soft tissues.
IMPRESSION: 1. Stable extent of the large right hemisphere infarct, and stable
petechial hemorrhage. But progressed intracranial mass effect since
08/30/2017.
Leftward midline shift is increased to 8-9 mm.
Lateral ventricle effacement has increased, but there is no
ventriculomegaly.
2. Stable basilar cistern patency.
3. No new intracranial abnormality.
